# Patient Record
Sex: Female | Born: 1935
Health system: Southern US, Community
[De-identification: ages and names within clinical notes are randomized; demographics above are authoritative.]

## PROBLEM LIST (undated history)

## (undated) DIAGNOSIS — E785 Hyperlipidemia, unspecified: Secondary | ICD-10-CM

## (undated) DIAGNOSIS — C801 Malignant (primary) neoplasm, unspecified: Secondary | ICD-10-CM

## (undated) DIAGNOSIS — E039 Hypothyroidism, unspecified: Secondary | ICD-10-CM

## (undated) DIAGNOSIS — I10 Essential (primary) hypertension: Secondary | ICD-10-CM

## (undated) DIAGNOSIS — C439 Malignant melanoma of skin, unspecified: Secondary | ICD-10-CM

## (undated) DIAGNOSIS — E079 Disorder of thyroid, unspecified: Secondary | ICD-10-CM

## (undated) DIAGNOSIS — R079 Chest pain, unspecified: Secondary | ICD-10-CM

## (undated) DIAGNOSIS — I639 Cerebral infarction, unspecified: Secondary | ICD-10-CM

## (undated) DIAGNOSIS — I509 Heart failure, unspecified: Secondary | ICD-10-CM

## (undated) DIAGNOSIS — I4891 Unspecified atrial fibrillation: Secondary | ICD-10-CM

## (undated) DIAGNOSIS — I499 Cardiac arrhythmia, unspecified: Secondary | ICD-10-CM

## (undated) DIAGNOSIS — R001 Bradycardia, unspecified: Secondary | ICD-10-CM

## (undated) DIAGNOSIS — E559 Vitamin D deficiency, unspecified: Secondary | ICD-10-CM

## (undated) DIAGNOSIS — R4701 Aphasia: Secondary | ICD-10-CM

## (undated) DIAGNOSIS — R197 Diarrhea, unspecified: Secondary | ICD-10-CM

## (undated) DIAGNOSIS — M069 Rheumatoid arthritis, unspecified: Secondary | ICD-10-CM

## (undated) HISTORY — PX: BREAST BIOPSY: SHX20

## (undated) HISTORY — PX: MELANOMA EXCISION: SHX5266

## (undated) HISTORY — PX: REPLACEMENT TOTAL HIP W/  RESURFACING IMPLANTS: SUR1222

## (undated) HISTORY — DX: Hypothyroidism, unspecified: E03.9

## (undated) HISTORY — DX: Essential (primary) hypertension: I10

## (undated) HISTORY — PX: TOTAL HIP ARTHROPLASTY: SHX124

## (undated) HISTORY — DX: Hyperlipidemia, unspecified: E78.5

## (undated) HISTORY — DX: Aphasia: R47.01

## (undated) HISTORY — DX: Disorder of thyroid, unspecified: E07.9

## (undated) HISTORY — DX: Rheumatoid arthritis, unspecified: M06.9

## (undated) HISTORY — DX: Cerebral infarction, unspecified: I63.9

## (undated) HISTORY — DX: Chest pain, unspecified: R07.9

## (undated) HISTORY — DX: Unspecified atrial fibrillation: I48.91

## (undated) HISTORY — DX: Malignant melanoma of skin, unspecified: C43.9

## (undated) HISTORY — DX: Bradycardia, unspecified: R00.1

## (undated) HISTORY — PX: HIP SURGERY: SHX245

## (undated) HISTORY — DX: Cardiac arrhythmia, unspecified: I49.9

## (undated) HISTORY — DX: Diarrhea, unspecified: R19.7

## (undated) HISTORY — PX: OTHER SURGICAL HISTORY: SHX169

## (undated) HISTORY — DX: Vitamin D deficiency, unspecified: E55.9

## (undated) HISTORY — DX: Heart failure, unspecified: I50.9

---

## 1998-08-05 ENCOUNTER — Inpatient Hospital Stay
Admission: EM | Admit: 1998-08-05 | Disposition: A | Discharge status: Home / Self Care | Payer: Self-pay | Source: Ambulatory Visit

## 2008-12-07 ENCOUNTER — Ambulatory Visit: Payer: Self-pay

## 2009-12-18 ENCOUNTER — Emergency Department: Payer: Self-pay | Admitting: Emergency Medicine

## 2009-12-21 ENCOUNTER — Ambulatory Visit: Payer: Self-pay | Admitting: Orthopedic Surgery

## 2009-12-22 ENCOUNTER — Ambulatory Visit: Payer: Self-pay | Admitting: Orthopedic Surgery

## 2010-11-15 ENCOUNTER — Ambulatory Visit: Payer: Self-pay | Admitting: Family Medicine

## 2011-03-19 HISTORY — PX: OTHER SURGICAL HISTORY: SHX169

## 2011-03-20 DIAGNOSIS — H4010X0 Unspecified open-angle glaucoma, stage unspecified: Secondary | ICD-10-CM | POA: Diagnosis not present

## 2011-04-03 DIAGNOSIS — H4010X0 Unspecified open-angle glaucoma, stage unspecified: Secondary | ICD-10-CM | POA: Diagnosis not present

## 2011-04-09 DIAGNOSIS — E039 Hypothyroidism, unspecified: Secondary | ICD-10-CM | POA: Diagnosis not present

## 2011-04-09 DIAGNOSIS — L259 Unspecified contact dermatitis, unspecified cause: Secondary | ICD-10-CM | POA: Diagnosis not present

## 2011-04-17 DIAGNOSIS — H4010X0 Unspecified open-angle glaucoma, stage unspecified: Secondary | ICD-10-CM | POA: Diagnosis not present

## 2011-07-25 DIAGNOSIS — H4010X0 Unspecified open-angle glaucoma, stage unspecified: Secondary | ICD-10-CM | POA: Diagnosis not present

## 2011-08-16 DIAGNOSIS — L0390 Cellulitis, unspecified: Secondary | ICD-10-CM | POA: Diagnosis not present

## 2011-09-16 DIAGNOSIS — M064 Inflammatory polyarthropathy: Secondary | ICD-10-CM | POA: Diagnosis not present

## 2011-09-16 DIAGNOSIS — M069 Rheumatoid arthritis, unspecified: Secondary | ICD-10-CM | POA: Diagnosis not present

## 2011-09-23 DIAGNOSIS — M76899 Other specified enthesopathies of unspecified lower limb, excluding foot: Secondary | ICD-10-CM | POA: Diagnosis not present

## 2011-09-23 DIAGNOSIS — M069 Rheumatoid arthritis, unspecified: Secondary | ICD-10-CM | POA: Diagnosis not present

## 2011-12-02 ENCOUNTER — Ambulatory Visit: Payer: Self-pay | Admitting: Family Medicine

## 2011-12-02 DIAGNOSIS — Z1231 Encounter for screening mammogram for malignant neoplasm of breast: Secondary | ICD-10-CM | POA: Diagnosis not present

## 2011-12-26 DIAGNOSIS — R030 Elevated blood-pressure reading, without diagnosis of hypertension: Secondary | ICD-10-CM | POA: Diagnosis not present

## 2011-12-26 DIAGNOSIS — Z23 Encounter for immunization: Secondary | ICD-10-CM | POA: Diagnosis not present

## 2011-12-26 DIAGNOSIS — D649 Anemia, unspecified: Secondary | ICD-10-CM | POA: Diagnosis not present

## 2011-12-26 DIAGNOSIS — E785 Hyperlipidemia, unspecified: Secondary | ICD-10-CM | POA: Diagnosis not present

## 2011-12-26 DIAGNOSIS — E039 Hypothyroidism, unspecified: Secondary | ICD-10-CM | POA: Diagnosis not present

## 2011-12-26 DIAGNOSIS — J3089 Other allergic rhinitis: Secondary | ICD-10-CM | POA: Diagnosis not present

## 2012-01-30 DIAGNOSIS — H4010X0 Unspecified open-angle glaucoma, stage unspecified: Secondary | ICD-10-CM | POA: Diagnosis not present

## 2012-02-26 DIAGNOSIS — H4010X0 Unspecified open-angle glaucoma, stage unspecified: Secondary | ICD-10-CM | POA: Diagnosis not present

## 2012-03-03 DIAGNOSIS — H4010X0 Unspecified open-angle glaucoma, stage unspecified: Secondary | ICD-10-CM | POA: Diagnosis not present

## 2012-03-05 DIAGNOSIS — E039 Hypothyroidism, unspecified: Secondary | ICD-10-CM | POA: Diagnosis not present

## 2012-03-23 DIAGNOSIS — M76899 Other specified enthesopathies of unspecified lower limb, excluding foot: Secondary | ICD-10-CM | POA: Diagnosis not present

## 2012-03-24 DIAGNOSIS — H4010X0 Unspecified open-angle glaucoma, stage unspecified: Secondary | ICD-10-CM | POA: Diagnosis not present

## 2012-03-25 DIAGNOSIS — M65839 Other synovitis and tenosynovitis, unspecified forearm: Secondary | ICD-10-CM | POA: Diagnosis not present

## 2012-03-25 DIAGNOSIS — M064 Inflammatory polyarthropathy: Secondary | ICD-10-CM | POA: Diagnosis not present

## 2012-05-07 DIAGNOSIS — I43 Cardiomyopathy in diseases classified elsewhere: Secondary | ICD-10-CM | POA: Diagnosis not present

## 2012-05-07 DIAGNOSIS — E039 Hypothyroidism, unspecified: Secondary | ICD-10-CM | POA: Diagnosis not present

## 2012-06-16 DIAGNOSIS — H4010X0 Unspecified open-angle glaucoma, stage unspecified: Secondary | ICD-10-CM | POA: Diagnosis not present

## 2012-06-23 DIAGNOSIS — R030 Elevated blood-pressure reading, without diagnosis of hypertension: Secondary | ICD-10-CM | POA: Diagnosis not present

## 2012-06-23 DIAGNOSIS — I43 Cardiomyopathy in diseases classified elsewhere: Secondary | ICD-10-CM | POA: Diagnosis not present

## 2012-06-23 DIAGNOSIS — E785 Hyperlipidemia, unspecified: Secondary | ICD-10-CM | POA: Diagnosis not present

## 2012-06-23 DIAGNOSIS — E039 Hypothyroidism, unspecified: Secondary | ICD-10-CM | POA: Diagnosis not present

## 2012-06-23 DIAGNOSIS — R5381 Other malaise: Secondary | ICD-10-CM | POA: Diagnosis not present

## 2012-10-07 DIAGNOSIS — M064 Inflammatory polyarthropathy: Secondary | ICD-10-CM | POA: Diagnosis not present

## 2012-10-13 DIAGNOSIS — M159 Polyosteoarthritis, unspecified: Secondary | ICD-10-CM | POA: Diagnosis not present

## 2012-10-13 DIAGNOSIS — M064 Inflammatory polyarthropathy: Secondary | ICD-10-CM | POA: Diagnosis not present

## 2012-10-14 DIAGNOSIS — H4010X0 Unspecified open-angle glaucoma, stage unspecified: Secondary | ICD-10-CM | POA: Diagnosis not present

## 2012-11-06 DIAGNOSIS — E039 Hypothyroidism, unspecified: Secondary | ICD-10-CM | POA: Diagnosis not present

## 2012-11-17 DIAGNOSIS — H4010X0 Unspecified open-angle glaucoma, stage unspecified: Secondary | ICD-10-CM | POA: Diagnosis not present

## 2012-12-02 ENCOUNTER — Ambulatory Visit: Payer: Self-pay | Admitting: Family Medicine

## 2012-12-02 DIAGNOSIS — Z1231 Encounter for screening mammogram for malignant neoplasm of breast: Secondary | ICD-10-CM | POA: Diagnosis not present

## 2012-12-17 DIAGNOSIS — Z85820 Personal history of malignant melanoma of skin: Secondary | ICD-10-CM | POA: Diagnosis not present

## 2012-12-17 DIAGNOSIS — Z1283 Encounter for screening for malignant neoplasm of skin: Secondary | ICD-10-CM | POA: Diagnosis not present

## 2012-12-17 DIAGNOSIS — L570 Actinic keratosis: Secondary | ICD-10-CM | POA: Diagnosis not present

## 2012-12-17 DIAGNOSIS — L723 Sebaceous cyst: Secondary | ICD-10-CM | POA: Diagnosis not present

## 2012-12-31 DIAGNOSIS — E039 Hypothyroidism, unspecified: Secondary | ICD-10-CM | POA: Diagnosis not present

## 2012-12-31 DIAGNOSIS — Z23 Encounter for immunization: Secondary | ICD-10-CM | POA: Diagnosis not present

## 2013-02-10 ENCOUNTER — Ambulatory Visit: Payer: Self-pay | Admitting: Family Medicine

## 2013-02-10 DIAGNOSIS — J984 Other disorders of lung: Secondary | ICD-10-CM | POA: Diagnosis not present

## 2013-02-10 DIAGNOSIS — Z8701 Personal history of pneumonia (recurrent): Secondary | ICD-10-CM | POA: Diagnosis not present

## 2013-02-10 DIAGNOSIS — E039 Hypothyroidism, unspecified: Secondary | ICD-10-CM | POA: Diagnosis not present

## 2013-02-10 DIAGNOSIS — E785 Hyperlipidemia, unspecified: Secondary | ICD-10-CM | POA: Diagnosis not present

## 2013-02-10 DIAGNOSIS — R030 Elevated blood-pressure reading, without diagnosis of hypertension: Secondary | ICD-10-CM | POA: Diagnosis not present

## 2013-02-10 DIAGNOSIS — D649 Anemia, unspecified: Secondary | ICD-10-CM | POA: Diagnosis not present

## 2013-02-10 DIAGNOSIS — I43 Cardiomyopathy in diseases classified elsewhere: Secondary | ICD-10-CM | POA: Diagnosis not present

## 2013-03-19 DIAGNOSIS — J329 Chronic sinusitis, unspecified: Secondary | ICD-10-CM | POA: Diagnosis not present

## 2013-04-07 DIAGNOSIS — M064 Inflammatory polyarthropathy: Secondary | ICD-10-CM | POA: Diagnosis not present

## 2013-04-22 DIAGNOSIS — M159 Polyosteoarthritis, unspecified: Secondary | ICD-10-CM | POA: Diagnosis not present

## 2013-04-22 DIAGNOSIS — M064 Inflammatory polyarthropathy: Secondary | ICD-10-CM | POA: Diagnosis not present

## 2013-04-27 DIAGNOSIS — J329 Chronic sinusitis, unspecified: Secondary | ICD-10-CM | POA: Diagnosis not present

## 2013-08-19 DIAGNOSIS — E785 Hyperlipidemia, unspecified: Secondary | ICD-10-CM | POA: Diagnosis not present

## 2013-08-19 DIAGNOSIS — I43 Cardiomyopathy in diseases classified elsewhere: Secondary | ICD-10-CM | POA: Diagnosis not present

## 2013-08-19 DIAGNOSIS — E039 Hypothyroidism, unspecified: Secondary | ICD-10-CM | POA: Diagnosis not present

## 2013-08-19 DIAGNOSIS — R030 Elevated blood-pressure reading, without diagnosis of hypertension: Secondary | ICD-10-CM | POA: Diagnosis not present

## 2013-09-24 DIAGNOSIS — H4010X0 Unspecified open-angle glaucoma, stage unspecified: Secondary | ICD-10-CM | POA: Diagnosis not present

## 2013-10-11 DIAGNOSIS — H4010X0 Unspecified open-angle glaucoma, stage unspecified: Secondary | ICD-10-CM | POA: Diagnosis not present

## 2013-10-14 DIAGNOSIS — M064 Inflammatory polyarthropathy: Secondary | ICD-10-CM | POA: Diagnosis not present

## 2013-10-21 DIAGNOSIS — M069 Rheumatoid arthritis, unspecified: Secondary | ICD-10-CM | POA: Diagnosis not present

## 2013-10-21 DIAGNOSIS — M064 Inflammatory polyarthropathy: Secondary | ICD-10-CM | POA: Diagnosis not present

## 2013-10-21 DIAGNOSIS — M1990 Unspecified osteoarthritis, unspecified site: Secondary | ICD-10-CM | POA: Diagnosis not present

## 2013-11-18 DIAGNOSIS — M064 Inflammatory polyarthropathy: Secondary | ICD-10-CM | POA: Diagnosis not present

## 2013-11-18 DIAGNOSIS — M1990 Unspecified osteoarthritis, unspecified site: Secondary | ICD-10-CM | POA: Diagnosis not present

## 2013-11-24 DIAGNOSIS — M064 Inflammatory polyarthropathy: Secondary | ICD-10-CM | POA: Diagnosis not present

## 2013-11-25 DIAGNOSIS — H4010X0 Unspecified open-angle glaucoma, stage unspecified: Secondary | ICD-10-CM | POA: Diagnosis not present

## 2013-12-07 DIAGNOSIS — H4010X0 Unspecified open-angle glaucoma, stage unspecified: Secondary | ICD-10-CM | POA: Diagnosis not present

## 2013-12-23 DIAGNOSIS — S80819A Abrasion, unspecified lower leg, initial encounter: Secondary | ICD-10-CM | POA: Diagnosis not present

## 2013-12-23 DIAGNOSIS — L03119 Cellulitis of unspecified part of limb: Secondary | ICD-10-CM | POA: Diagnosis not present

## 2014-01-07 DIAGNOSIS — H4010X2 Unspecified open-angle glaucoma, moderate stage: Secondary | ICD-10-CM | POA: Diagnosis not present

## 2014-01-12 ENCOUNTER — Ambulatory Visit: Payer: Self-pay | Admitting: Family Medicine

## 2014-01-12 DIAGNOSIS — Z1231 Encounter for screening mammogram for malignant neoplasm of breast: Secondary | ICD-10-CM | POA: Diagnosis not present

## 2014-01-17 DIAGNOSIS — M1990 Unspecified osteoarthritis, unspecified site: Secondary | ICD-10-CM | POA: Diagnosis not present

## 2014-01-24 DIAGNOSIS — M79642 Pain in left hand: Secondary | ICD-10-CM | POA: Diagnosis not present

## 2014-01-24 DIAGNOSIS — Z79899 Other long term (current) drug therapy: Secondary | ICD-10-CM | POA: Diagnosis not present

## 2014-01-24 DIAGNOSIS — M0589 Other rheumatoid arthritis with rheumatoid factor of multiple sites: Secondary | ICD-10-CM | POA: Diagnosis not present

## 2014-02-03 DIAGNOSIS — L814 Other melanin hyperpigmentation: Secondary | ICD-10-CM | POA: Diagnosis not present

## 2014-02-03 DIAGNOSIS — D485 Neoplasm of uncertain behavior of skin: Secondary | ICD-10-CM | POA: Diagnosis not present

## 2014-02-03 DIAGNOSIS — L923 Foreign body granuloma of the skin and subcutaneous tissue: Secondary | ICD-10-CM | POA: Diagnosis not present

## 2014-02-03 DIAGNOSIS — Z189 Retained foreign body fragments, unspecified material: Secondary | ICD-10-CM | POA: Diagnosis not present

## 2014-02-03 DIAGNOSIS — Z1283 Encounter for screening for malignant neoplasm of skin: Secondary | ICD-10-CM | POA: Diagnosis not present

## 2014-02-03 DIAGNOSIS — Z85820 Personal history of malignant melanoma of skin: Secondary | ICD-10-CM | POA: Diagnosis not present

## 2014-02-03 DIAGNOSIS — Z08 Encounter for follow-up examination after completed treatment for malignant neoplasm: Secondary | ICD-10-CM | POA: Diagnosis not present

## 2014-02-03 DIAGNOSIS — S90465A Insect bite (nonvenomous), left lesser toe(s), initial encounter: Secondary | ICD-10-CM | POA: Diagnosis not present

## 2014-02-03 DIAGNOSIS — L570 Actinic keratosis: Secondary | ICD-10-CM | POA: Diagnosis not present

## 2014-02-15 DIAGNOSIS — H4010X2 Unspecified open-angle glaucoma, moderate stage: Secondary | ICD-10-CM | POA: Diagnosis not present

## 2014-04-26 DIAGNOSIS — M0579 Rheumatoid arthritis with rheumatoid factor of multiple sites without organ or systems involvement: Secondary | ICD-10-CM | POA: Diagnosis not present

## 2014-04-26 DIAGNOSIS — Z79899 Other long term (current) drug therapy: Secondary | ICD-10-CM | POA: Diagnosis not present

## 2014-05-10 DIAGNOSIS — D649 Anemia, unspecified: Secondary | ICD-10-CM | POA: Diagnosis not present

## 2014-05-10 DIAGNOSIS — I32 Pericarditis in diseases classified elsewhere: Secondary | ICD-10-CM | POA: Diagnosis not present

## 2014-05-10 DIAGNOSIS — E785 Hyperlipidemia, unspecified: Secondary | ICD-10-CM | POA: Diagnosis not present

## 2014-05-10 DIAGNOSIS — I10 Essential (primary) hypertension: Secondary | ICD-10-CM | POA: Diagnosis not present

## 2014-05-10 DIAGNOSIS — I43 Cardiomyopathy in diseases classified elsewhere: Secondary | ICD-10-CM | POA: Diagnosis not present

## 2014-05-10 DIAGNOSIS — E039 Hypothyroidism, unspecified: Secondary | ICD-10-CM | POA: Diagnosis not present

## 2014-05-10 DIAGNOSIS — J3081 Allergic rhinitis due to animal (cat) (dog) hair and dander: Secondary | ICD-10-CM | POA: Diagnosis not present

## 2014-05-20 DIAGNOSIS — H4011X2 Primary open-angle glaucoma, moderate stage: Secondary | ICD-10-CM | POA: Diagnosis not present

## 2014-07-19 DIAGNOSIS — Z79899 Other long term (current) drug therapy: Secondary | ICD-10-CM | POA: Diagnosis not present

## 2014-07-19 DIAGNOSIS — M0579 Rheumatoid arthritis with rheumatoid factor of multiple sites without organ or systems involvement: Secondary | ICD-10-CM | POA: Diagnosis not present

## 2014-07-26 DIAGNOSIS — Z79899 Other long term (current) drug therapy: Secondary | ICD-10-CM | POA: Diagnosis not present

## 2014-07-26 DIAGNOSIS — M0579 Rheumatoid arthritis with rheumatoid factor of multiple sites without organ or systems involvement: Secondary | ICD-10-CM | POA: Diagnosis not present

## 2014-07-26 DIAGNOSIS — M150 Primary generalized (osteo)arthritis: Secondary | ICD-10-CM | POA: Diagnosis not present

## 2014-08-22 ENCOUNTER — Encounter: Payer: Self-pay | Admitting: Family Medicine

## 2014-08-22 ENCOUNTER — Other Ambulatory Visit: Payer: Self-pay

## 2014-08-22 ENCOUNTER — Ambulatory Visit (INDEPENDENT_AMBULATORY_CARE_PROVIDER_SITE_OTHER): Payer: Medicare Other | Admitting: Family Medicine

## 2014-08-22 VITALS — BP 120/60 | HR 60 | Ht 63.0 in | Wt 109.0 lb

## 2014-08-22 DIAGNOSIS — I32 Pericarditis in diseases classified elsewhere: Secondary | ICD-10-CM

## 2014-08-22 DIAGNOSIS — W57XXXA Bitten or stung by nonvenomous insect and other nonvenomous arthropods, initial encounter: Secondary | ICD-10-CM

## 2014-08-22 DIAGNOSIS — E785 Hyperlipidemia, unspecified: Secondary | ICD-10-CM | POA: Insufficient documentation

## 2014-08-22 DIAGNOSIS — E039 Hypothyroidism, unspecified: Secondary | ICD-10-CM | POA: Insufficient documentation

## 2014-08-22 DIAGNOSIS — M19049 Primary osteoarthritis, unspecified hand: Secondary | ICD-10-CM | POA: Insufficient documentation

## 2014-08-22 DIAGNOSIS — I519 Heart disease, unspecified: Secondary | ICD-10-CM

## 2014-08-22 DIAGNOSIS — S80861A Insect bite (nonvenomous), right lower leg, initial encounter: Secondary | ICD-10-CM | POA: Diagnosis not present

## 2014-08-22 DIAGNOSIS — I10 Essential (primary) hypertension: Secondary | ICD-10-CM | POA: Insufficient documentation

## 2014-08-22 DIAGNOSIS — B029 Zoster without complications: Secondary | ICD-10-CM | POA: Insufficient documentation

## 2014-08-22 DIAGNOSIS — J3081 Allergic rhinitis due to animal (cat) (dog) hair and dander: Secondary | ICD-10-CM | POA: Insufficient documentation

## 2014-08-22 DIAGNOSIS — D649 Anemia, unspecified: Secondary | ICD-10-CM | POA: Insufficient documentation

## 2014-08-22 MED ORDER — DOXYCYCLINE HYCLATE 100 MG PO TABS: 100.0000 mg | ORAL_TABLET | Freq: Two times a day (BID) | ORAL | Status: DC

## 2014-08-22 MED ORDER — MUPIROCIN 2 % EX OINT: 1.0000 "application " | TOPICAL_OINTMENT | Freq: Two times a day (BID) | CUTANEOUS | Status: DC

## 2014-08-22 NOTE — Progress Notes (Signed)
Name: Andrea Kemp   MRN: [id number]    DOB: 12-21-1929   Date:08/22/2014       Progress Note  Subjective  Chief Complaint  Chief Complaint  Patient presents with  . Rash    tick bite on back of L) leg- a blister came up between toes    Rash This is a new problem. The current episode started in the past 7 days. The problem has been gradually improving since onset. The affected locations include the left lower leg. The rash is characterized by bruising, itchiness, redness and blistering (left foot blister/ thursday). She was exposed to an insect bite/sting. Associated symptoms include a fever. Pertinent negatives include no anorexia, congestion, cough, shortness of breath or sore throat. Past treatments include topical steroids, antihistamine and analgesics (tylenol PM). The treatment provided mild relief.    No problem-specific assessment & plan notes found for this encounter.   Past Medical History  Diagnosis Date  . Hypothyroidism   . Hyperlipidemia   . Hypertension     Past Surgical History  Procedure Laterality Date  . Melanoma excision    . Replacement total hip w/  resurfacing implants Left     History reviewed. No pertinent family history.  History   Social History  . Marital Status: Married    Spouse Name: N/A  . Number of Children: N/A  . Years of Education: N/A   Occupational History  . Not on file.   Social History Main Topics  . Smoking status: Never Smoker   . Smokeless tobacco: Not on file  . Alcohol Use: 0.0 oz/week    0 Standard drinks or equivalent per week  . Drug Use: No  . Sexual Activity: No   Other Topics Concern  . Not on file   Social History Narrative  . No narrative on file    Allergies  Allergen Reactions  . Ace Inhibitors      Review of Systems  Constitutional: Positive for fever. Negative for chills and malaise/fatigue.  HENT: Negative for congestion and sore throat.   Eyes: Negative for blurred vision.   Respiratory: Negative for cough, shortness of breath and wheezing.   Cardiovascular: Negative for chest pain and palpitations.  Gastrointestinal: Negative for anorexia.  Skin: Positive for itching and rash.  Neurological: Negative for headaches.  Endo/Heme/Allergies: Bruises/bleeds easily.     Objective  Filed Vitals:   08/22/14 1025  BP: 120/60  Pulse: 60  Height: '5\' 3"'$  (1.6 m)  Weight: 109 lb (49.442 kg)    Physical Exam  Constitutional: She is well-developed, well-nourished, and in no distress.  HENT:  Head: Normocephalic.  Right Ear: External ear normal.  Left Ear: External ear normal.  Eyes: Conjunctivae and EOM are normal. Pupils are equal, round, and reactive to light.  Neck: Normal range of motion. Neck supple. No thyromegaly present.  Cardiovascular: Normal rate, regular rhythm, normal heart sounds and intact distal pulses.   No murmur heard. Pulmonary/Chest: Effort normal and breath sounds normal. No respiratory distress. She has no wheezes. She has no rales.  Abdominal: Soft. Bowel sounds are normal. She exhibits no mass. There is no tenderness. There is no guarding.  Lymphadenopathy:    She has no cervical adenopathy.  Neurological: She is alert.  Skin: Skin is warm. Rash noted. There is erythema.  Deer tick removed right foot  Psychiatric: Mood and affect normal.  Nursing note and vitals reviewed.     No results found for this or any previous  visit (from the past 2160 hour(s)).   Assessment & Plan  Problem List Items Addressed This Visit    None        Dr. Otilio Miu [facility]  08/22/2014

## 2014-10-19 DIAGNOSIS — M0579 Rheumatoid arthritis with rheumatoid factor of multiple sites without organ or systems involvement: Secondary | ICD-10-CM | POA: Diagnosis not present

## 2014-10-19 DIAGNOSIS — Z79899 Other long term (current) drug therapy: Secondary | ICD-10-CM | POA: Diagnosis not present

## 2014-10-26 DIAGNOSIS — M150 Primary generalized (osteo)arthritis: Secondary | ICD-10-CM | POA: Diagnosis not present

## 2014-10-26 DIAGNOSIS — M0579 Rheumatoid arthritis with rheumatoid factor of multiple sites without organ or systems involvement: Secondary | ICD-10-CM | POA: Diagnosis not present

## 2014-11-08 DIAGNOSIS — H4011X2 Primary open-angle glaucoma, moderate stage: Secondary | ICD-10-CM | POA: Diagnosis not present

## 2014-12-19 ENCOUNTER — Encounter: Payer: Self-pay | Admitting: Family Medicine

## 2014-12-19 ENCOUNTER — Ambulatory Visit (INDEPENDENT_AMBULATORY_CARE_PROVIDER_SITE_OTHER): Payer: Medicare Other | Admitting: Family Medicine

## 2014-12-19 VITALS — BP 110/70 | HR 60 | Ht 63.0 in | Wt 108.0 lb

## 2014-12-19 DIAGNOSIS — J0100 Acute maxillary sinusitis, unspecified: Secondary | ICD-10-CM

## 2014-12-19 MED ORDER — GUAIFENESIN-CODEINE 100-10 MG/5ML PO SOLN: 5.0000 mL | Freq: Three times a day (TID) | ORAL | Status: DC | PRN

## 2014-12-19 MED ORDER — AMOXICILLIN 500 MG PO CAPS: 500.0000 mg | ORAL_CAPSULE | Freq: Three times a day (TID) | ORAL | Status: DC

## 2014-12-19 NOTE — Progress Notes (Signed)
Name: Andrea Kemp   MRN: [id number]    DOB: 03/05/1930   Date:12/19/2014       Progress Note  Subjective  Chief Complaint  Chief Complaint  Patient presents with  . Sinusitis    yellow production    Sinusitis This is a new problem. The current episode started in the past 7 days. The problem has been gradually worsening since onset. There has been no fever. Associated symptoms include chills, congestion, coughing, ear pain, headaches, shortness of breath, sinus pressure, sneezing and a sore throat. Pertinent negatives include no diaphoresis, neck pain or swollen glands. Past treatments include acetaminophen (mucinex). The treatment provided mild relief.    No problem-specific assessment & plan notes found for this encounter.   Past Medical History  Diagnosis Date  . Hypothyroidism   . Hyperlipidemia   . Hypertension     Past Surgical History  Procedure Laterality Date  . Melanoma excision    . Replacement total hip w/  resurfacing implants Left     History reviewed. No pertinent family history.  Social History   Social History  . Marital Status: Married    Spouse Name: N/A  . Number of Children: N/A  . Years of Education: N/A   Occupational History  . Not on file.   Social History Main Topics  . Smoking status: Never Smoker   . Smokeless tobacco: Not on file  . Alcohol Use: 0.0 oz/week    0 Standard drinks or equivalent per week  . Drug Use: No  . Sexual Activity: No   Other Topics Concern  . Not on file   Social History Narrative    Allergies  Allergen Reactions  . Ace Inhibitors      Review of Systems  Constitutional: Positive for chills. Negative for fever, weight loss, malaise/fatigue and diaphoresis.  HENT: Positive for congestion, ear pain, sinus pressure, sneezing and sore throat. Negative for ear discharge.   Eyes: Negative for blurred vision.  Respiratory: Positive for cough and shortness of breath. Negative for sputum production  and wheezing.   Cardiovascular: Negative for chest pain, palpitations and leg swelling.  Gastrointestinal: Negative for heartburn, nausea, abdominal pain, diarrhea, constipation, blood in stool and melena.  Genitourinary: Negative for dysuria, urgency, frequency and hematuria.  Musculoskeletal: Negative for myalgias, back pain, joint pain and neck pain.  Skin: Negative for rash.  Neurological: Positive for headaches. Negative for dizziness, tingling, sensory change and focal weakness.  Endo/Heme/Allergies: Negative for environmental allergies and polydipsia. Does not bruise/bleed easily.  Psychiatric/Behavioral: Negative for depression and suicidal ideas. The patient is not nervous/anxious and does not have insomnia.      Objective  Filed Vitals:   12/19/14 1336  BP: 110/70  Pulse: 60  Height: '5\' 3"'$  (1.6 m)  Weight: 108 lb (48.988 kg)    Physical Exam  Constitutional: She is well-developed, well-nourished, and in no distress. No distress.  HENT:  Head: Normocephalic and atraumatic.  Right Ear: External ear normal.  Left Ear: External ear normal.  Nose: Nose normal.  Mouth/Throat: Oropharynx is clear and moist.  Eyes: Conjunctivae and EOM are normal. Pupils are equal, round, and reactive to light. Right eye exhibits no discharge. Left eye exhibits no discharge.  Neck: Normal range of motion. Neck supple. No JVD present. No thyromegaly present.  Cardiovascular: Normal rate, regular rhythm, normal heart sounds and intact distal pulses.  Exam reveals no gallop and no friction rub.   No murmur heard. Pulmonary/Chest: Effort normal and breath  sounds normal.  Abdominal: Soft. Bowel sounds are normal. She exhibits no mass. There is no tenderness. There is no guarding.  Musculoskeletal: Normal range of motion. She exhibits no edema.  Lymphadenopathy:    She has no cervical adenopathy.  Neurological: She is alert. She has normal reflexes.  Skin: Skin is warm and dry. She is not  diaphoretic.  Psychiatric: Mood and affect normal.      Assessment & Plan  Problem List Items Addressed This Visit    None    Visit Diagnoses    Acute maxillary sinusitis, recurrence not specified    -  Primary    Relevant Medications    amoxicillin (AMOXIL) 500 MG capsule    guaiFENesin-codeine 100-10 MG/5ML syrup         Dr. Deanna Jones [facility]  12/19/2014

## 2014-12-30 ENCOUNTER — Other Ambulatory Visit: Payer: Self-pay | Admitting: Family Medicine

## 2015-01-10 ENCOUNTER — Other Ambulatory Visit: Payer: Self-pay

## 2015-01-10 DIAGNOSIS — Z1231 Encounter for screening mammogram for malignant neoplasm of breast: Secondary | ICD-10-CM

## 2015-01-13 ENCOUNTER — Other Ambulatory Visit: Payer: Self-pay | Admitting: Family Medicine

## 2015-01-19 ENCOUNTER — Ambulatory Visit
Admission: RE | Admit: 2015-01-19 | Discharge: 2015-01-19 | Disposition: A | Discharge status: Home / Self Care | Payer: Medicare Other | Source: Ambulatory Visit | Attending: Family Medicine | Admitting: Family Medicine

## 2015-01-19 DIAGNOSIS — Z1231 Encounter for screening mammogram for malignant neoplasm of breast: Secondary | ICD-10-CM | POA: Diagnosis not present

## 2015-01-19 HISTORY — DX: Malignant (primary) neoplasm, unspecified: C80.1

## 2015-01-25 DIAGNOSIS — M0579 Rheumatoid arthritis with rheumatoid factor of multiple sites without organ or systems involvement: Secondary | ICD-10-CM | POA: Diagnosis not present

## 2015-04-10 ENCOUNTER — Other Ambulatory Visit: Payer: Self-pay

## 2015-04-25 ENCOUNTER — Ambulatory Visit (INDEPENDENT_AMBULATORY_CARE_PROVIDER_SITE_OTHER): Payer: Medicare Other | Admitting: Family Medicine

## 2015-04-25 ENCOUNTER — Encounter: Payer: Self-pay | Admitting: Family Medicine

## 2015-04-25 VITALS — BP 110/62 | HR 62 | Ht 63.0 in | Wt 108.0 lb

## 2015-04-25 DIAGNOSIS — J302 Other seasonal allergic rhinitis: Secondary | ICD-10-CM

## 2015-04-25 DIAGNOSIS — E039 Hypothyroidism, unspecified: Secondary | ICD-10-CM

## 2015-04-25 DIAGNOSIS — E785 Hyperlipidemia, unspecified: Secondary | ICD-10-CM | POA: Diagnosis not present

## 2015-04-25 DIAGNOSIS — I10 Essential (primary) hypertension: Secondary | ICD-10-CM | POA: Diagnosis not present

## 2015-04-25 MED ORDER — FLUTICASONE PROPIONATE 50 MCG/ACT NA SUSP: 2.0000 | NASAL | Status: DC | PRN

## 2015-04-25 MED ORDER — ATORVASTATIN CALCIUM 20 MG PO TABS
ORAL_TABLET | ORAL | Status: DC
Start: 2015-04-25 — End: 2016-05-02

## 2015-04-25 MED ORDER — LEVOTHYROXINE SODIUM 88 MCG PO TABS: 88.0000 ug | ORAL_TABLET | Freq: Every day | ORAL | Status: DC

## 2015-04-25 MED ORDER — LOSARTAN POTASSIUM 50 MG PO TABS: ORAL_TABLET | ORAL | Status: DC

## 2015-04-25 NOTE — Progress Notes (Signed)
Name: Andrea Kemp   MRN: [id number]    DOB: [birth year]   Date:04/25/2015       Progress Note  Subjective  Chief Complaint  Chief Complaint  Patient presents with  . Hypertension  . Hyperlipidemia  . Hypothyroidism    Hypertension This is a chronic problem. The current episode started more than 1 year ago. The problem has been gradually improving since onset. The problem is controlled. Pertinent negatives include no anxiety, blurred vision, chest pain, headaches, malaise/fatigue, neck pain, orthopnea, palpitations, peripheral edema, PND, shortness of breath or sweats. There are no associated agents to hypertension. There are no known risk factors for coronary artery disease. Past treatments include angiotensin blockers. The current treatment provides moderate improvement. There are no compliance problems.  There is no history of angina, kidney disease, CAD/MI, CVA, heart failure, left ventricular hypertrophy, PVD, renovascular disease or retinopathy. There is no history of chronic renal disease or a hypertension causing med.  Hyperlipidemia This is a chronic problem. The current episode started more than 1 year ago. The problem is controlled. Recent lipid tests were reviewed and are low. She has no history of chronic renal disease. Factors aggravating her hyperlipidemia include thiazides. Pertinent negatives include no chest pain, focal sensory loss, focal weakness, leg pain, myalgias or shortness of breath. Current antihyperlipidemic treatment includes statins. The current treatment provides mild improvement of lipids. There are no compliance problems.  There are no known risk factors for coronary artery disease.    No problem-specific assessment & plan notes found for this encounter.   Past Medical History  Diagnosis Date  . Hypothyroidism   . Hyperlipidemia   . Hypertension   . Cancer ([place])     skin ca    Past Surgical History  Procedure Laterality Date  . Melanoma  excision    . Replacement total hip w/  resurfacing implants Left   . Breast biopsy Left     neg    Family History  Problem Relation Age of Onset  . Breast cancer Sister 40    Social History   Social History  . Marital Status: Married    Spouse Name: N/A  . Number of Children: N/A  . Years of Education: N/A   Occupational History  . Not on file.   Social History Main Topics  . Smoking status: Never Smoker   . Smokeless tobacco: Not on file  . Alcohol Use: 0.0 oz/week    0 Standard drinks or equivalent per week  . Drug Use: No  . Sexual Activity: No   Other Topics Concern  . Not on file   Social History Narrative    Allergies  Allergen Reactions  . Ace Inhibitors      Review of Systems  Constitutional: Negative for fever, chills, weight loss and malaise/fatigue.  HENT: Negative for ear discharge, ear pain and sore throat.   Eyes: Negative for blurred vision.  Respiratory: Negative for cough, sputum production, shortness of breath and wheezing.   Cardiovascular: Negative for chest pain, palpitations, orthopnea, leg swelling and PND.  Gastrointestinal: Negative for heartburn, nausea, abdominal pain, diarrhea, constipation, blood in stool and melena.  Genitourinary: Negative for dysuria, urgency, frequency and hematuria.  Musculoskeletal: Negative for myalgias, back pain, joint pain and neck pain.  Skin: Negative for rash.  Neurological: Negative for dizziness, tingling, sensory change, focal weakness and headaches.  Endo/Heme/Allergies: Negative for environmental allergies and polydipsia. Does not bruise/bleed easily.  Psychiatric/Behavioral: Negative for depression and suicidal ideas. The  patient is not nervous/anxious and does not have insomnia.      Objective  Filed Vitals:   04/25/15 1336  BP: 110/62  Pulse: 62  Height: '5\' 3"'$  (1.6 m)  Weight: 108 lb (48.988 kg)    Physical Exam  Constitutional: She is well-developed, well-nourished, and in no  distress. No distress.  HENT:  Head: Normocephalic and atraumatic.  Right Ear: External ear normal.  Left Ear: External ear normal.  Nose: Nose normal.  Mouth/Throat: Oropharynx is clear and moist.  Eyes: Conjunctivae and EOM are normal. Pupils are equal, round, and reactive to light. Right eye exhibits no discharge. Left eye exhibits no discharge.  Neck: Normal range of motion. Neck supple. No JVD present. No thyromegaly present.  Cardiovascular: Normal rate, regular rhythm, normal heart sounds and intact distal pulses.  Exam reveals no gallop and no friction rub.   No murmur heard. Pulmonary/Chest: Effort normal and breath sounds normal.  Abdominal: Soft. Bowel sounds are normal. She exhibits no mass. There is no tenderness. There is no guarding.  Musculoskeletal: Normal range of motion. She exhibits no edema.  Lymphadenopathy:    She has no cervical adenopathy.  Neurological: She is alert.  Skin: Skin is warm and dry. She is not diaphoretic.  Psychiatric: Mood and affect normal.      Assessment & Plan  Problem List Items Addressed This Visit      Cardiovascular and Mediastinum   Benign essential HTN - Primary   Relevant Medications   atorvastatin (LIPITOR) 20 MG tablet   losartan (COZAAR) 50 MG tablet   Other Relevant Orders   Renal Function Panel     Other   HLD (hyperlipidemia)   Relevant Medications   atorvastatin (LIPITOR) 20 MG tablet   losartan (COZAAR) 50 MG tablet   Other Relevant Orders   Lipid Profile    Other Visit Diagnoses    Hypothyroidism, unspecified hypothyroidism type        Relevant Medications    levothyroxine (SYNTHROID, LEVOTHROID) 88 MCG tablet    Other Relevant Orders    TSH    Other seasonal allergic rhinitis        Relevant Medications    fluticasone (FLONASE) 50 MCG/ACT nasal spray         Dr. Deanna Jones [facility]  04/25/2015

## 2015-04-26 DIAGNOSIS — M0579 Rheumatoid arthritis with rheumatoid factor of multiple sites without organ or systems involvement: Secondary | ICD-10-CM | POA: Diagnosis not present

## 2015-04-26 LAB — LIPID PANEL
CHOLESTEROL TOTAL: 140 mg/dL (ref 100–199)
Chol/HDL Ratio: 2.5 ratio units (ref 0.0–4.4)
HDL: 57 mg/dL (ref >39)
LDL CALC: 56 mg/dL (ref 0–99)
TRIGLYCERIDES: 136 mg/dL (ref 0–149)
VLDL Cholesterol Cal: 27 mg/dL (ref 5–40)

## 2015-04-26 LAB — RENAL FUNCTION PANEL
Albumin: 4.8 g/dL — ABNORMAL HIGH (ref 3.5–4.7)
BUN/Creatinine Ratio: 25 (ref 11–26)
BUN: 18 mg/dL (ref 8–27)
CO2: 26 mmol/L (ref 18–29)
Calcium: 10.2 mg/dL (ref 8.7–10.3)
Chloride: 99 mmol/L (ref 96–106)
Creatinine, Ser: 0.71 mg/dL (ref 0.57–1.00)
GFR calc Af Amer: 91 mL/min/{1.73_m2} (ref >59)
GFR calc non Af Amer: 79 mL/min/{1.73_m2} (ref >59)
Glucose: 89 mg/dL (ref 65–99)
Phosphorus: 4.4 mg/dL (ref 2.5–4.5)
Potassium: 4.4 mmol/L (ref 3.5–5.2)
Sodium: 140 mmol/L (ref 134–144)

## 2015-04-26 LAB — TSH: TSH: 0.784 u[IU]/mL (ref 0.450–4.500)

## 2015-05-03 DIAGNOSIS — M0579 Rheumatoid arthritis with rheumatoid factor of multiple sites without organ or systems involvement: Secondary | ICD-10-CM | POA: Diagnosis not present

## 2015-05-03 DIAGNOSIS — I950 Idiopathic hypotension: Secondary | ICD-10-CM | POA: Diagnosis not present

## 2015-05-03 DIAGNOSIS — M150 Primary generalized (osteo)arthritis: Secondary | ICD-10-CM | POA: Diagnosis not present

## 2015-05-09 DIAGNOSIS — H401112 Primary open-angle glaucoma, right eye, moderate stage: Secondary | ICD-10-CM | POA: Diagnosis not present

## 2015-07-04 DIAGNOSIS — H401112 Primary open-angle glaucoma, right eye, moderate stage: Secondary | ICD-10-CM | POA: Diagnosis not present

## 2015-07-18 DIAGNOSIS — H401112 Primary open-angle glaucoma, right eye, moderate stage: Secondary | ICD-10-CM | POA: Diagnosis not present

## 2015-08-01 DIAGNOSIS — I950 Idiopathic hypotension: Secondary | ICD-10-CM | POA: Diagnosis not present

## 2015-08-01 DIAGNOSIS — M0579 Rheumatoid arthritis with rheumatoid factor of multiple sites without organ or systems involvement: Secondary | ICD-10-CM | POA: Diagnosis not present

## 2015-08-01 DIAGNOSIS — M150 Primary generalized (osteo)arthritis: Secondary | ICD-10-CM | POA: Diagnosis not present

## 2015-08-29 DIAGNOSIS — H401112 Primary open-angle glaucoma, right eye, moderate stage: Secondary | ICD-10-CM | POA: Diagnosis not present

## 2015-10-05 ENCOUNTER — Encounter: Payer: Self-pay | Admitting: Family Medicine

## 2015-10-05 ENCOUNTER — Ambulatory Visit (INDEPENDENT_AMBULATORY_CARE_PROVIDER_SITE_OTHER): Payer: Medicare Other | Admitting: Family Medicine

## 2015-10-05 VITALS — BP 138/78 | HR 60 | Temp 97.7°F | Ht 60.0 in | Wt 110.0 lb

## 2015-10-05 DIAGNOSIS — E039 Hypothyroidism, unspecified: Secondary | ICD-10-CM

## 2015-10-05 DIAGNOSIS — I10 Essential (primary) hypertension: Secondary | ICD-10-CM

## 2015-10-05 DIAGNOSIS — K900 Celiac disease: Secondary | ICD-10-CM | POA: Diagnosis not present

## 2015-10-05 NOTE — Progress Notes (Signed)
Name: Andrea Kemp   MRN: [id number]    DOB: 10/29/1929   Date:10/05/2015       Progress Note  Subjective  Chief Complaint  Chief Complaint  Patient presents with  . Hypertension    Hypertension This is a chronic problem. The current episode started more than 1 year ago. The problem has been waxing and waning since onset. The problem is controlled. Pertinent negatives include no anxiety, blurred vision, chest pain, headaches, malaise/fatigue, neck pain, orthopnea, palpitations, peripheral edema, PND, shortness of breath or sweats. There are no associated agents to hypertension. There are no known risk factors for coronary artery disease.    No problem-specific assessment & plan notes found for this encounter.   Past Medical History  Diagnosis Date  . Hypertension   . Hypothyroidism   . Hyperlipidemia     Past Surgical History  Procedure Laterality Date  . Total hip arthroplasty Left   . Arm surgery Right 2013    pin placed from fracture    History reviewed. No pertinent family history.  Social History   Social History  . Marital Status: Widowed    Spouse Name: N/A  . Number of Children: N/A  . Years of Education: N/A   Occupational History  . Not on file.   Social History Main Topics  . Smoking status: Never Smoker   . Smokeless tobacco: Never Used  . Alcohol Use: 4.2 oz/week    7 Glasses of wine per week  . Drug Use: No  . Sexual Activity: Not on file   Other Topics Concern  . Not on file   Social History Narrative  . No narrative on file    Allergies  Allergen Reactions  . Lisinopril Other (See Comments)    Mouth and tongue swelling     Review of Systems  Constitutional: Negative for fever, chills, weight loss and malaise/fatigue.  HENT: Negative for ear discharge, ear pain and sore throat.   Eyes: Negative for blurred vision.  Respiratory: Negative for cough, sputum production, shortness of breath and wheezing.   Cardiovascular: Negative  for chest pain, palpitations, orthopnea, leg swelling and PND.  Gastrointestinal: Negative for heartburn, nausea, abdominal pain, diarrhea, constipation, blood in stool and melena.  Genitourinary: Negative for dysuria, urgency, frequency and hematuria.  Musculoskeletal: Negative for myalgias, back pain, joint pain and neck pain.  Skin: Negative for rash.  Neurological: Negative for dizziness, tingling, sensory change, focal weakness and headaches.  Endo/Heme/Allergies: Negative for environmental allergies and polydipsia. Does not bruise/bleed easily.  Psychiatric/Behavioral: Negative for depression and suicidal ideas. The patient is not nervous/anxious and does not have insomnia.      Objective  Filed Vitals:   10/05/15 1034 10/05/15 1059  BP: 152/76 138/78  Pulse: 60   Temp: 97.7 F (36.5 C)   Weight: 110 lb (49.896 kg)   SpO2: 98%     Physical Exam  Constitutional: She is well-developed, well-nourished, and in no distress. No distress.  HENT:  Head: Normocephalic and atraumatic.  Right Ear: External ear normal.  Left Ear: External ear normal.  Nose: Nose normal.  Mouth/Throat: Oropharynx is clear and moist.  Eyes: Conjunctivae and EOM are normal. Pupils are equal, round, and reactive to light. Right eye exhibits no discharge. Left eye exhibits no discharge.  Neck: Normal range of motion. Neck supple. No JVD present. No thyromegaly present.  Cardiovascular: Normal rate, regular rhythm, normal heart sounds and intact distal pulses.  Exam reveals no gallop and no friction rub.  No murmur heard. Pulmonary/Chest: Effort normal and breath sounds normal. She has no wheezes. She has no rales.  Abdominal: Soft. Bowel sounds are normal. She exhibits no mass. There is no tenderness. There is no guarding.  Musculoskeletal: Normal range of motion. She exhibits no edema.  Lymphadenopathy:    She has no cervical adenopathy.  Neurological: She is alert. She has normal reflexes.  Skin:  Skin is warm and dry. She is not diaphoretic.  Psychiatric: Mood and affect normal.  Nursing note and vitals reviewed.     Assessment & Plan  Problem List Items Addressed This Visit    None    Visit Diagnoses    Essential hypertension    -  Primary    Relevant Medications    diltiazem (CARDIZEM CD) 240 MG 24 hr capsule    losartan (COZAAR) 50 MG tablet    atorvastatin (LIPITOR) 20 MG tablet    Other Relevant Orders    Renal Function Panel    Celiac disease        Relevant Orders    CBC    Hypothyroidism, unspecified hypothyroidism type        Relevant Medications    levothyroxine (SYNTHROID, LEVOTHROID) 88 MCG tablet    Other Relevant Orders    TSH         Dr. Deanna Jones [facility]  10/05/2015

## 2015-10-06 LAB — CBC
Hematocrit: 42.5 % (ref 34.0–46.6)
Hemoglobin: 13.8 g/dL (ref 11.1–15.9)
MCH: 33.9 pg — AB (ref 26.6–33.0)
MCHC: 32.5 g/dL (ref 31.5–35.7)
MCV: 104 fL — ABNORMAL HIGH (ref 79–97)
Platelets: 252 10*3/uL (ref 150–379)
RBC: 4.07 x10E6/uL (ref 3.77–5.28)
RDW: 13.9 % (ref 12.3–15.4)
WBC: 6 10*3/uL (ref 3.4–10.8)

## 2015-10-06 LAB — RENAL FUNCTION PANEL
Albumin: 4.5 g/dL (ref 3.5–4.7)
BUN / CREAT RATIO: 20 (ref 12–28)
BUN: 14 mg/dL (ref 8–27)
CALCIUM: 9.4 mg/dL (ref 8.7–10.3)
CHLORIDE: 98 mmol/L (ref 96–106)
CO2: 22 mmol/L (ref 18–29)
Creatinine, Ser: 0.71 mg/dL (ref 0.57–1.00)
GFR calc non Af Amer: 79 mL/min/{1.73_m2} (ref >59)
GFR, EST AFRICAN AMERICAN: 91 mL/min/{1.73_m2} (ref >59)
GLUCOSE: 100 mg/dL — AB (ref 65–99)
POTASSIUM: 4 mmol/L (ref 3.5–5.2)
Phosphorus: 3.8 mg/dL (ref 2.5–4.5)
Sodium: 140 mmol/L (ref 134–144)

## 2015-10-06 LAB — TSH: TSH: 0.682 u[IU]/mL (ref 0.450–4.500)

## 2015-10-17 ENCOUNTER — Encounter: Payer: Self-pay | Admitting: Family Medicine

## 2015-10-24 DIAGNOSIS — I950 Idiopathic hypotension: Secondary | ICD-10-CM | POA: Diagnosis not present

## 2015-10-24 DIAGNOSIS — M150 Primary generalized (osteo)arthritis: Secondary | ICD-10-CM | POA: Diagnosis not present

## 2015-10-24 DIAGNOSIS — M0579 Rheumatoid arthritis with rheumatoid factor of multiple sites without organ or systems involvement: Secondary | ICD-10-CM | POA: Diagnosis not present

## 2015-10-31 DIAGNOSIS — M79642 Pain in left hand: Secondary | ICD-10-CM | POA: Diagnosis not present

## 2015-10-31 DIAGNOSIS — M150 Primary generalized (osteo)arthritis: Secondary | ICD-10-CM | POA: Diagnosis not present

## 2015-10-31 DIAGNOSIS — Z79899 Other long term (current) drug therapy: Secondary | ICD-10-CM | POA: Diagnosis not present

## 2015-10-31 DIAGNOSIS — M0579 Rheumatoid arthritis with rheumatoid factor of multiple sites without organ or systems involvement: Secondary | ICD-10-CM | POA: Diagnosis not present

## 2015-11-16 ENCOUNTER — Encounter: Payer: Self-pay | Admitting: Family Medicine

## 2015-11-16 ENCOUNTER — Ambulatory Visit (INDEPENDENT_AMBULATORY_CARE_PROVIDER_SITE_OTHER): Payer: Medicare Other | Admitting: Family Medicine

## 2015-11-16 VITALS — BP 122/70 | HR 80 | Ht 60.0 in | Wt 112.0 lb

## 2015-11-16 DIAGNOSIS — I10 Essential (primary) hypertension: Secondary | ICD-10-CM | POA: Diagnosis not present

## 2015-11-16 DIAGNOSIS — R0602 Shortness of breath: Secondary | ICD-10-CM | POA: Diagnosis not present

## 2015-11-16 DIAGNOSIS — E0500 Thyrotoxicosis with diffuse goiter without thyrotoxic crisis or storm: Secondary | ICD-10-CM | POA: Diagnosis not present

## 2015-11-16 DIAGNOSIS — M069 Rheumatoid arthritis, unspecified: Secondary | ICD-10-CM | POA: Diagnosis not present

## 2015-11-16 DIAGNOSIS — R0989 Other specified symptoms and signs involving the circulatory and respiratory systems: Secondary | ICD-10-CM | POA: Diagnosis not present

## 2015-11-16 DIAGNOSIS — K219 Gastro-esophageal reflux disease without esophagitis: Secondary | ICD-10-CM | POA: Diagnosis not present

## 2015-11-16 DIAGNOSIS — I208 Other forms of angina pectoris: Secondary | ICD-10-CM | POA: Diagnosis not present

## 2015-11-16 DIAGNOSIS — R55 Syncope and collapse: Secondary | ICD-10-CM | POA: Diagnosis not present

## 2015-11-16 DIAGNOSIS — I4891 Unspecified atrial fibrillation: Secondary | ICD-10-CM | POA: Diagnosis not present

## 2015-11-16 DIAGNOSIS — R42 Dizziness and giddiness: Secondary | ICD-10-CM | POA: Diagnosis not present

## 2015-11-16 DIAGNOSIS — E039 Hypothyroidism, unspecified: Secondary | ICD-10-CM | POA: Diagnosis not present

## 2015-11-16 DIAGNOSIS — D649 Anemia, unspecified: Secondary | ICD-10-CM | POA: Diagnosis not present

## 2015-11-16 DIAGNOSIS — M1990 Unspecified osteoarthritis, unspecified site: Secondary | ICD-10-CM | POA: Diagnosis not present

## 2015-11-16 NOTE — Progress Notes (Signed)
Name: Andrea Kemp   MRN: [id number]    DOB: [birth year]   Date:11/16/2015       Progress Note  Subjective  Chief Complaint  Chief Complaint  Patient presents with  . Dizziness    "get off balance when turn head too quickly"  . Labs Only    wants thyroid rechecked    Dizziness  This is a recurrent problem. The current episode started in the past 7 days (turn head left/ felt funny/ near syncopy/ duration 4-5 minutes). The problem occurs intermittently. The problem has been waxing and waning. Pertinent negatives include no abdominal pain, anorexia, arthralgias, change in bowel habit, chest pain, chills, congestion, coughing, diaphoresis, fatigue, fever, headaches, joint swelling, myalgias, nausea, neck pain, numbness, rash, sore throat, swollen glands, urinary symptoms, vertigo, visual change, vomiting or weakness. Exacerbated by: turning head. She has tried nothing for the symptoms. The treatment provided mild relief.    No problem-specific Assessment & Plan notes found for this encounter.   Past Medical History:  Diagnosis Date  . Cancer ([place])    skin ca  . Hyperlipidemia   . Hypertension   . Hypothyroidism     Past Surgical History:  Procedure Laterality Date  . arm surgery Right 2013   pin placed from fracture  . BREAST BIOPSY Left    neg  . MELANOMA EXCISION    . REPLACEMENT TOTAL HIP W/  RESURFACING IMPLANTS Left   . TOTAL HIP ARTHROPLASTY Left     Family History  Problem Relation Age of Onset  . Breast cancer Sister 62    Social History   Social History  . Marital status: Widowed    Spouse name: N/A  . Number of children: N/A  . Years of education: N/A   Occupational History  . Not on file.   Social History Main Topics  . Smoking status: Never Smoker  . Smokeless tobacco: Never Used  . Alcohol use 4.2 oz/week    7 Glasses of wine per week  . Drug use: No  . Sexual activity: No   Other Topics Concern  . Not on file   Social History  Narrative   ** Merged History Encounter **        Allergies  Allergen Reactions  . Ace Inhibitors   . Lisinopril Other (See Comments)    Mouth and tongue swelling     Review of Systems  Constitutional: Negative for chills, diaphoresis, fatigue, fever, malaise/fatigue and weight loss.  HENT: Negative for congestion, ear discharge, ear pain, sore throat and tinnitus.   Eyes: Negative for blurred vision.  Respiratory: Negative for cough, sputum production, shortness of breath and wheezing.   Cardiovascular: Positive for palpitations. Negative for chest pain, orthopnea, leg swelling and PND.  Gastrointestinal: Negative for abdominal pain, anorexia, blood in stool, change in bowel habit, constipation, diarrhea, heartburn, melena, nausea and vomiting.  Genitourinary: Negative for dysuria, frequency, hematuria and urgency.  Musculoskeletal: Negative for arthralgias, back pain, joint pain, joint swelling, myalgias and neck pain.  Skin: Negative for rash.  Neurological: Positive for dizziness. Negative for vertigo, tingling, sensory change, focal weakness, weakness, numbness and headaches.  Endo/Heme/Allergies: Negative for environmental allergies and polydipsia. Does not bruise/bleed easily.  Psychiatric/Behavioral: Negative for depression and suicidal ideas. The patient is not nervous/anxious and does not have insomnia.      Objective  Vitals:   11/16/15 0955  BP: 122/70  Pulse: 80  Weight: 112 lb (50.8 kg)  Height: 5' (1.524 m)  Physical Exam  Constitutional: She is well-developed, well-nourished, and in no distress. No distress.  HENT:  Head: Normocephalic and atraumatic.  Right Ear: External ear normal.  Left Ear: External ear normal.  Nose: Nose normal.  Mouth/Throat: Oropharynx is clear and moist.  Eyes: Conjunctivae and EOM are normal. Pupils are equal, round, and reactive to light. Right eye exhibits no discharge. Left eye exhibits no discharge.  Neck: Normal range  of motion. Neck supple. No JVD present. No thyromegaly present.  Cardiovascular: Normal rate, regular rhythm, normal heart sounds and intact distal pulses.  Exam reveals no gallop, no S3, no S4 and no friction rub.   No murmur heard. Pulses:      Carotid pulses are 1+ on the right side, and 2+ on the left side. Pulmonary/Chest: Effort normal and breath sounds normal.  Abdominal: Soft. Bowel sounds are normal. She exhibits no mass. There is no tenderness. There is no guarding.  Musculoskeletal: Normal range of motion. She exhibits no edema.  Lymphadenopathy:    She has no cervical adenopathy.  Neurological: She is alert. She has normal strength and intact cranial nerves.  Skin: Skin is warm and dry. She is not diaphoretic.  Psychiatric: Mood and affect normal.  Nursing note and vitals reviewed.     Assessment & Plan  Problem List Items Addressed This Visit      Cardiovascular and Mediastinum   Benign essential HTN    Other Visit Diagnoses    Near syncope    -  Primary   Relevant Orders   EKG 12-Lead (Completed)   US Carotid Duplex Bilateral   Ambulatory referral to Cardiology   Pulse irregularity       Hypothyroidism, unspecified hypothyroidism type       Relevant Orders   TSH   Atrial fibrillation, unspecified type [facility])       Relevant Orders   Ambulatory referral to Cardiology   TSH        Dr. Otilio Miu [facility]  11/16/15

## 2015-11-21 ENCOUNTER — Other Ambulatory Visit: Payer: Self-pay

## 2015-11-27 ENCOUNTER — Other Ambulatory Visit: Payer: Self-pay

## 2015-11-27 DIAGNOSIS — I208 Other forms of angina pectoris: Secondary | ICD-10-CM | POA: Diagnosis not present

## 2015-11-27 DIAGNOSIS — I4891 Unspecified atrial fibrillation: Secondary | ICD-10-CM | POA: Diagnosis not present

## 2015-11-27 DIAGNOSIS — R0602 Shortness of breath: Secondary | ICD-10-CM | POA: Diagnosis not present

## 2015-11-27 MED ORDER — DILTIAZEM HCL ER 240 MG PO CP24: ORAL_CAPSULE | ORAL | 0 refills | Status: DC

## 2015-11-30 DIAGNOSIS — I4891 Unspecified atrial fibrillation: Secondary | ICD-10-CM | POA: Diagnosis not present

## 2015-11-30 DIAGNOSIS — R42 Dizziness and giddiness: Secondary | ICD-10-CM | POA: Diagnosis not present

## 2015-12-22 DIAGNOSIS — M0609 Rheumatoid arthritis without rheumatoid factor, multiple sites: Secondary | ICD-10-CM | POA: Diagnosis not present

## 2015-12-22 DIAGNOSIS — Z23 Encounter for immunization: Secondary | ICD-10-CM | POA: Diagnosis not present

## 2015-12-22 DIAGNOSIS — K900 Celiac disease: Secondary | ICD-10-CM | POA: Diagnosis not present

## 2015-12-22 DIAGNOSIS — I10 Essential (primary) hypertension: Secondary | ICD-10-CM | POA: Diagnosis not present

## 2015-12-22 DIAGNOSIS — I482 Chronic atrial fibrillation: Secondary | ICD-10-CM | POA: Diagnosis not present

## 2015-12-22 DIAGNOSIS — E784 Other hyperlipidemia: Secondary | ICD-10-CM | POA: Diagnosis not present

## 2015-12-22 DIAGNOSIS — Z79899 Other long term (current) drug therapy: Secondary | ICD-10-CM | POA: Diagnosis not present

## 2015-12-22 DIAGNOSIS — E039 Hypothyroidism, unspecified: Secondary | ICD-10-CM | POA: Diagnosis not present

## 2015-12-28 DIAGNOSIS — E784 Other hyperlipidemia: Secondary | ICD-10-CM | POA: Diagnosis not present

## 2015-12-28 DIAGNOSIS — I10 Essential (primary) hypertension: Secondary | ICD-10-CM | POA: Diagnosis not present

## 2015-12-28 DIAGNOSIS — D7589 Other specified diseases of blood and blood-forming organs: Secondary | ICD-10-CM | POA: Diagnosis not present

## 2015-12-28 DIAGNOSIS — I482 Chronic atrial fibrillation: Secondary | ICD-10-CM | POA: Diagnosis not present

## 2015-12-28 DIAGNOSIS — Z23 Encounter for immunization: Secondary | ICD-10-CM | POA: Diagnosis not present

## 2016-01-10 DIAGNOSIS — E784 Other hyperlipidemia: Secondary | ICD-10-CM | POA: Diagnosis not present

## 2016-01-10 DIAGNOSIS — R0602 Shortness of breath: Secondary | ICD-10-CM | POA: Diagnosis not present

## 2016-01-10 DIAGNOSIS — I482 Chronic atrial fibrillation: Secondary | ICD-10-CM | POA: Diagnosis not present

## 2016-02-19 DIAGNOSIS — E784 Other hyperlipidemia: Secondary | ICD-10-CM | POA: Diagnosis not present

## 2016-02-19 DIAGNOSIS — M0579 Rheumatoid arthritis with rheumatoid factor of multiple sites without organ or systems involvement: Secondary | ICD-10-CM | POA: Diagnosis not present

## 2016-02-19 DIAGNOSIS — I482 Chronic atrial fibrillation: Secondary | ICD-10-CM | POA: Diagnosis not present

## 2016-02-21 DIAGNOSIS — I10 Essential (primary) hypertension: Secondary | ICD-10-CM | POA: Diagnosis not present

## 2016-02-21 DIAGNOSIS — I482 Chronic atrial fibrillation: Secondary | ICD-10-CM | POA: Diagnosis not present

## 2016-02-21 DIAGNOSIS — E784 Other hyperlipidemia: Secondary | ICD-10-CM | POA: Diagnosis not present

## 2016-03-04 DIAGNOSIS — M069 Rheumatoid arthritis, unspecified: Secondary | ICD-10-CM | POA: Diagnosis not present

## 2016-03-04 DIAGNOSIS — M1990 Unspecified osteoarthritis, unspecified site: Secondary | ICD-10-CM | POA: Diagnosis not present

## 2016-03-04 DIAGNOSIS — R0602 Shortness of breath: Secondary | ICD-10-CM | POA: Diagnosis not present

## 2016-03-04 DIAGNOSIS — K219 Gastro-esophageal reflux disease without esophagitis: Secondary | ICD-10-CM | POA: Diagnosis not present

## 2016-03-04 DIAGNOSIS — R42 Dizziness and giddiness: Secondary | ICD-10-CM | POA: Diagnosis not present

## 2016-03-04 DIAGNOSIS — E0500 Thyrotoxicosis with diffuse goiter without thyrotoxic crisis or storm: Secondary | ICD-10-CM | POA: Diagnosis not present

## 2016-03-04 DIAGNOSIS — D649 Anemia, unspecified: Secondary | ICD-10-CM | POA: Diagnosis not present

## 2016-03-04 DIAGNOSIS — I208 Other forms of angina pectoris: Secondary | ICD-10-CM | POA: Diagnosis not present

## 2016-03-04 DIAGNOSIS — I480 Paroxysmal atrial fibrillation: Secondary | ICD-10-CM | POA: Diagnosis not present

## 2016-03-04 DIAGNOSIS — I4891 Unspecified atrial fibrillation: Secondary | ICD-10-CM | POA: Diagnosis not present

## 2016-04-23 DIAGNOSIS — M0579 Rheumatoid arthritis with rheumatoid factor of multiple sites without organ or systems involvement: Secondary | ICD-10-CM | POA: Diagnosis not present

## 2016-04-23 DIAGNOSIS — Z79899 Other long term (current) drug therapy: Secondary | ICD-10-CM | POA: Diagnosis not present

## 2016-05-02 ENCOUNTER — Ambulatory Visit
Admission: RE | Admit: 2016-05-02 | Discharge: 2016-05-02 | Disposition: A | Discharge status: Home / Self Care | Payer: Medicare Other | Source: Ambulatory Visit | Attending: Family Medicine | Admitting: Family Medicine

## 2016-05-02 ENCOUNTER — Ambulatory Visit (INDEPENDENT_AMBULATORY_CARE_PROVIDER_SITE_OTHER): Payer: Medicare Other | Admitting: Family Medicine

## 2016-05-02 ENCOUNTER — Encounter: Payer: Self-pay | Admitting: Family Medicine

## 2016-05-02 VITALS — BP 142/88 | HR 91 | Ht 63.0 in | Wt 115.0 lb

## 2016-05-02 DIAGNOSIS — I10 Essential (primary) hypertension: Secondary | ICD-10-CM

## 2016-05-02 DIAGNOSIS — I482 Chronic atrial fibrillation: Secondary | ICD-10-CM

## 2016-05-02 DIAGNOSIS — E039 Hypothyroidism, unspecified: Secondary | ICD-10-CM | POA: Diagnosis not present

## 2016-05-02 DIAGNOSIS — Z1239 Encounter for other screening for malignant neoplasm of breast: Secondary | ICD-10-CM

## 2016-05-02 DIAGNOSIS — E785 Hyperlipidemia, unspecified: Secondary | ICD-10-CM | POA: Diagnosis not present

## 2016-05-02 DIAGNOSIS — I4821 Permanent atrial fibrillation: Secondary | ICD-10-CM

## 2016-05-02 DIAGNOSIS — R0609 Other forms of dyspnea: Secondary | ICD-10-CM | POA: Insufficient documentation

## 2016-05-02 DIAGNOSIS — Z1231 Encounter for screening mammogram for malignant neoplasm of breast: Secondary | ICD-10-CM | POA: Diagnosis not present

## 2016-05-02 DIAGNOSIS — J9811 Atelectasis: Secondary | ICD-10-CM | POA: Diagnosis not present

## 2016-05-02 NOTE — Progress Notes (Signed)
Name: Andrea Kemp   MRN: [id number]    DOB: Oct 13, 1929   Date:05/02/2016       Progress Note  Subjective  Chief Complaint  Chief Complaint  Patient presents with  . Hypertension  . Atrial Fibrillation  . Hypothyroidism    Hypertension  This is a chronic problem. The problem has been waxing and waning since onset. The problem is controlled. Associated symptoms include palpitations and shortness of breath. Pertinent negatives include no anxiety, blurred vision, chest pain, headaches, malaise/fatigue, neck pain, orthopnea, peripheral edema, PND or sweats. (Dyspnea on '25mg'$  metoprolol) There are no associated agents to hypertension. Past treatments include beta blockers and calcium channel blockers. The current treatment provides moderate improvement. There are no compliance problems.  There is no history of angina, kidney disease, CAD/MI, CVA, heart failure, left ventricular hypertrophy, PVD, renovascular disease or retinopathy. Identifiable causes of hypertension include a thyroid problem. There is no history of chronic renal disease or a hypertension causing med.  Atrial Fibrillation  Presents for follow-up visit. Symptoms include hypertension, palpitations and shortness of breath. Symptoms are negative for bradycardia, chest pain, dizziness, hemodynamic instability, hypotension, pacemaker problem, syncope, tachycardia and weakness. (Patient on Eliquest and metoprolol) The symptoms have been stable. Past medical history includes atrial fibrillation. There is no history of CAD.  Thyroid Problem  Presents for follow-up visit. Symptoms include dry skin and palpitations. Patient reports no anxiety, cold intolerance, constipation, depressed mood, diaphoresis, diarrhea, fatigue, hair loss, heat intolerance, hoarse voice, leg swelling, menstrual problem, nail problem, tremors, visual change, weight gain or weight loss. The symptoms have been stable. Her past medical history is significant for  atrial fibrillation. There is no history of heart failure.  Shortness of Breath  This is a recurrent problem. The current episode started more than 1 month ago. The problem occurs daily. The problem has been waxing and waning. Pertinent negatives include no abdominal pain, chest pain, claudication, coryza, ear pain, fever, headaches, hemoptysis, leg swelling, neck pain, orthopnea, PND, rash, rhinorrhea, sore throat, sputum production, syncope or wheezing. Nothing aggravates the symptoms. The patient has no known risk factors for DVT/PE. There is no history of allergies, asthma, CAD, chronic lung disease, a heart failure or PE.    No problem-specific Assessment & Plan notes found for this encounter.   Past Medical History:  Diagnosis Date  . Cancer ([place])    skin ca  . Hyperlipidemia   . Hypertension   . Hypothyroidism     Past Surgical History:  Procedure Laterality Date  . arm surgery Right 2013   pin placed from fracture  . BREAST BIOPSY Left    neg  . MELANOMA EXCISION    . REPLACEMENT TOTAL HIP W/  RESURFACING IMPLANTS Left   . TOTAL HIP ARTHROPLASTY Left     Family History  Problem Relation Age of Onset  . Breast cancer Sister 29    Social History   Social History  . Marital status: Widowed    Spouse name: N/A  . Number of children: N/A  . Years of education: N/A   Occupational History  . Not on file.   Social History Main Topics  . Smoking status: Never Smoker  . Smokeless tobacco: Never Used  . Alcohol use 4.2 oz/week    7 Glasses of wine per week  . Drug use: No  . Sexual activity: No   Other Topics Concern  . Not on file   Social History Narrative   ** Merged History Encounter **  Allergies  Allergen Reactions  . Ace Inhibitors   . Lisinopril Other (See Comments)    Mouth and tongue swelling     Review of Systems  Constitutional: Negative for chills, diaphoresis, fatigue, fever, malaise/fatigue, weight gain and weight loss.  HENT:  Negative for ear discharge, ear pain, hoarse voice, rhinorrhea and sore throat.   Eyes: Negative for blurred vision.  Respiratory: Positive for shortness of breath. Negative for cough, hemoptysis, sputum production and wheezing.   Cardiovascular: Positive for palpitations. Negative for chest pain, orthopnea, claudication, leg swelling, syncope and PND.  Gastrointestinal: Negative for abdominal pain, blood in stool, constipation, diarrhea, heartburn, melena and nausea.  Genitourinary: Negative for dysuria, frequency, hematuria, menstrual problem and urgency.  Musculoskeletal: Negative for back pain, joint pain, myalgias and neck pain.  Skin: Negative for rash.  Neurological: Negative for dizziness, tingling, tremors, sensory change, focal weakness, weakness and headaches.  Endo/Heme/Allergies: Negative for environmental allergies, cold intolerance, heat intolerance and polydipsia. Does not bruise/bleed easily.  Psychiatric/Behavioral: Negative for depression and suicidal ideas. The patient is not nervous/anxious and does not have insomnia.      Objective  Vitals:   05/02/16 1452  BP: (!) 142/88  Pulse: 91  SpO2: 96%  Weight: 115 lb (52.2 kg)  Height: '5\' 3"'$  (1.6 m)    Physical Exam  Constitutional: She is well-developed, well-nourished, and in no distress. No distress.  HENT:  Head: Normocephalic and atraumatic.  Right Ear: External ear normal.  Left Ear: External ear normal.  Nose: Nose normal.  Mouth/Throat: Oropharynx is clear and moist.  Eyes: Conjunctivae and EOM are normal. Pupils are equal, round, and reactive to light. Right eye exhibits no discharge. Left eye exhibits no discharge.  Neck: Normal range of motion. Neck supple. No JVD present. No thyromegaly present.  Cardiovascular: Normal rate, regular rhythm, normal heart sounds and intact distal pulses.  Exam reveals no gallop and no friction rub.   No murmur heard. Pulmonary/Chest: Effort normal. She has decreased breath  sounds in the left lower field. She has no wheezes. She has no rhonchi. She has rales.      Abdominal: Soft. Bowel sounds are normal. She exhibits no mass. There is no tenderness. There is no guarding.  Musculoskeletal: Normal range of motion. She exhibits no edema.  Lymphadenopathy:    She has no cervical adenopathy.  Neurological: She is alert.  Skin: Skin is warm and dry. She is not diaphoretic.  Psychiatric: Mood and affect normal.  Nursing note and vitals reviewed.     Assessment & Plan  Problem List Items Addressed This Visit      Cardiovascular and Mediastinum   Benign essential HTN - Primary   Relevant Medications   ELIQUIS 2.5 MG TABS tablet   metoprolol tartrate (LOPRESSOR) 25 MG tablet   diltiazem (CARDIZEM CD) 240 MG 24 hr capsule     Other   HLD (hyperlipidemia)   Relevant Medications   ELIQUIS 2.5 MG TABS tablet   metoprolol tartrate (LOPRESSOR) 25 MG tablet   diltiazem (CARDIZEM CD) 240 MG 24 hr capsule    Other Visit Diagnoses    Hypothyroidism, unspecified type       Relevant Medications   ELIQUIS 2.5 MG TABS tablet   metoprolol tartrate (LOPRESSOR) 25 MG tablet   Other Relevant Orders   TSH   Dyspnea on exertion       Relevant Orders   DG Chest 2 View   Permanent atrial fibrillation ([place])  Relevant Medications   ELIQUIS 2.5 MG TABS tablet   metoprolol tartrate (LOPRESSOR) 25 MG tablet   diltiazem (CARDIZEM CD) 240 MG 24 hr capsule        Dr. [facility]  05/02/16

## 2016-05-03 LAB — TSH: TSH: 31.15 u[IU]/mL — ABNORMAL HIGH (ref 0.450–4.500)

## 2016-05-03 NOTE — Addendum Note (Signed)
Addended by: Fredderick Severance on: 05/03/2016 08:42 AM   Modules accepted: Orders

## 2016-05-07 ENCOUNTER — Other Ambulatory Visit: Payer: Self-pay

## 2016-05-07 MED ORDER — LEVOTHYROXINE SODIUM 100 MCG PO TABS: 88.0000 ug | ORAL_TABLET | Freq: Every day | ORAL | 1 refills | Status: DC

## 2016-05-09 ENCOUNTER — Inpatient Hospital Stay: Admission: RE | Admit: 2016-05-09 | Payer: Self-pay | Source: Ambulatory Visit

## 2016-05-09 DIAGNOSIS — R0602 Shortness of breath: Secondary | ICD-10-CM | POA: Diagnosis not present

## 2016-05-09 DIAGNOSIS — M150 Primary generalized (osteo)arthritis: Secondary | ICD-10-CM | POA: Diagnosis not present

## 2016-05-09 DIAGNOSIS — Z79899 Other long term (current) drug therapy: Secondary | ICD-10-CM | POA: Diagnosis not present

## 2016-05-09 DIAGNOSIS — M0579 Rheumatoid arthritis with rheumatoid factor of multiple sites without organ or systems involvement: Secondary | ICD-10-CM | POA: Diagnosis not present

## 2016-05-14 ENCOUNTER — Ambulatory Visit
Admission: RE | Admit: 2016-05-14 | Discharge: 2016-05-14 | Disposition: A | Discharge status: Home / Self Care | Payer: Medicare Other | Source: Ambulatory Visit | Attending: Family Medicine | Admitting: Family Medicine

## 2016-05-14 DIAGNOSIS — Z1231 Encounter for screening mammogram for malignant neoplasm of breast: Secondary | ICD-10-CM | POA: Diagnosis not present

## 2016-05-14 DIAGNOSIS — Z1239 Encounter for other screening for malignant neoplasm of breast: Secondary | ICD-10-CM

## 2016-05-15 ENCOUNTER — Other Ambulatory Visit: Payer: Self-pay

## 2016-05-17 DIAGNOSIS — H401112 Primary open-angle glaucoma, right eye, moderate stage: Secondary | ICD-10-CM | POA: Diagnosis not present

## 2016-05-20 DIAGNOSIS — I480 Paroxysmal atrial fibrillation: Secondary | ICD-10-CM | POA: Diagnosis not present

## 2016-05-20 DIAGNOSIS — R0602 Shortness of breath: Secondary | ICD-10-CM | POA: Diagnosis not present

## 2016-05-20 DIAGNOSIS — R42 Dizziness and giddiness: Secondary | ICD-10-CM | POA: Diagnosis not present

## 2016-05-20 DIAGNOSIS — K219 Gastro-esophageal reflux disease without esophagitis: Secondary | ICD-10-CM | POA: Diagnosis not present

## 2016-05-20 DIAGNOSIS — E0500 Thyrotoxicosis with diffuse goiter without thyrotoxic crisis or storm: Secondary | ICD-10-CM | POA: Diagnosis not present

## 2016-05-20 DIAGNOSIS — D649 Anemia, unspecified: Secondary | ICD-10-CM | POA: Diagnosis not present

## 2016-05-20 DIAGNOSIS — M1990 Unspecified osteoarthritis, unspecified site: Secondary | ICD-10-CM | POA: Diagnosis not present

## 2016-05-20 DIAGNOSIS — M069 Rheumatoid arthritis, unspecified: Secondary | ICD-10-CM | POA: Diagnosis not present

## 2016-05-20 DIAGNOSIS — I208 Other forms of angina pectoris: Secondary | ICD-10-CM | POA: Diagnosis not present

## 2016-07-05 DIAGNOSIS — H401112 Primary open-angle glaucoma, right eye, moderate stage: Secondary | ICD-10-CM | POA: Diagnosis not present

## 2016-07-25 ENCOUNTER — Encounter: Payer: Self-pay | Admitting: Family Medicine

## 2016-07-25 ENCOUNTER — Ambulatory Visit (INDEPENDENT_AMBULATORY_CARE_PROVIDER_SITE_OTHER): Payer: Medicare Other | Admitting: Family Medicine

## 2016-07-25 VITALS — BP 98/78 | HR 83 | Resp 16 | Ht 63.0 in | Wt 111.0 lb

## 2016-07-25 DIAGNOSIS — I10 Essential (primary) hypertension: Secondary | ICD-10-CM | POA: Diagnosis not present

## 2016-07-25 DIAGNOSIS — R69 Illness, unspecified: Secondary | ICD-10-CM

## 2016-07-25 DIAGNOSIS — I482 Chronic atrial fibrillation: Secondary | ICD-10-CM

## 2016-07-25 DIAGNOSIS — E785 Hyperlipidemia, unspecified: Secondary | ICD-10-CM

## 2016-07-25 DIAGNOSIS — E039 Hypothyroidism, unspecified: Secondary | ICD-10-CM | POA: Diagnosis not present

## 2016-07-25 DIAGNOSIS — I4821 Permanent atrial fibrillation: Secondary | ICD-10-CM

## 2016-07-25 MED ORDER — HYDROCHLOROTHIAZIDE 12.5 MG PO TABS: 12.5000 mg | ORAL_TABLET | Freq: Every day | ORAL | 3 refills | Status: DC

## 2016-07-25 MED ORDER — DILTIAZEM HCL ER COATED BEADS 240 MG PO CP24: 240.0000 mg | ORAL_CAPSULE | ORAL | 3 refills | Status: AC

## 2016-07-25 MED ORDER — METOPROLOL TARTRATE 25 MG PO TABS: 25.0000 mg | ORAL_TABLET | Freq: Two times a day (BID) | ORAL | 3 refills | Status: DC

## 2016-07-25 NOTE — Progress Notes (Signed)
Name: Andrea Kemp   MRN: [id number]    DOB: 1930/01/15   Date:07/26/2016       Progress Note  Subjective  Chief Complaint  Chief Complaint  Patient presents with  . Hypertension  . Thyroid Problem    Hypertension  This is a chronic problem. The current episode started more than 1 year ago. The problem has been waxing and waning since onset. The problem is controlled. Pertinent negatives include no anxiety, blurred vision, chest pain, headaches, malaise/fatigue, neck pain, orthopnea, palpitations, peripheral edema, PND, shortness of breath or sweats. There are no associated agents to hypertension. Risk factors for coronary artery disease include post-menopausal state. Past treatments include beta blockers, calcium channel blockers and diuretics. The current treatment provides moderate improvement. There are no compliance problems.  There is no history of angina, kidney disease, CAD/MI, CVA, heart failure, left ventricular hypertrophy, PVD or retinopathy. Identifiable causes of hypertension include a thyroid problem. There is no history of chronic renal disease, a hypertension causing med or renovascular disease.  Thyroid Problem  Presents for follow-up visit. Symptoms include cold intolerance, depressed mood and fatigue. Patient reports no anxiety, constipation, diaphoresis, diarrhea, dry skin, hair loss, heat intolerance, hoarse voice, leg swelling, menstrual problem, nail problem, palpitations, tremors, visual change, weight gain or weight loss. The symptoms have been stable. There is no history of heart failure.    No problem-specific Assessment & Plan notes found for this encounter.   Past Medical History:  Diagnosis Date  . Cancer ([place])    skin ca  . Hyperlipidemia   . Hypertension   . Hypothyroidism     Past Surgical History:  Procedure Laterality Date  . arm surgery Right 2013   pin placed from fracture  . BREAST BIOPSY Left    neg  . MELANOMA EXCISION    .  REPLACEMENT TOTAL HIP W/  RESURFACING IMPLANTS Left   . TOTAL HIP ARTHROPLASTY Left     Family History  Problem Relation Age of Onset  . Breast cancer Sister 37    Social History   Social History  . Marital status: Widowed    Spouse name: N/A  . Number of children: N/A  . Years of education: N/A   Occupational History  . Not on file.   Social History Main Topics  . Smoking status: Never Smoker  . Smokeless tobacco: Never Used  . Alcohol use 4.2 oz/week    7 Glasses of wine per week  . Drug use: No  . Sexual activity: No   Other Topics Concern  . Not on file   Social History Narrative   ** Merged History Encounter **        Allergies  Allergen Reactions  . Ace Inhibitors Other (See Comments)  . Lisinopril Other (See Comments) and Swelling    Tongue swelling and can't swallow Mouth and tongue swelling    Outpatient Medications Prior to Visit  Medication Sig Dispense Refill  . COMBIGAN 0.2-0.5 % ophthalmic solution     . ELIQUIS 2.5 MG TABS tablet Take 2.5 mg by mouth 1 day or 1 dose.    . latanoprost (XALATAN) 0.005 % ophthalmic solution Apply 1 drop to eye at bedtime.    . Ascorbic Acid (VITAMIN C PO) Take by mouth daily.    Marland Kitchen diltiazem (CARDIZEM CD) 240 MG 24 hr capsule Take 240 mg by mouth daily.    Marland Kitchen LUMIGAN 0.01 % SOLN     . Multiple Vitamins-Minerals (CENTRUM SILVER ADULT  50+ PO) Take 1 tablet by mouth daily.    . fluticasone (FLONASE) 50 MCG/ACT nasal spray Place 2 sprays into both nostrils as needed. 16 g 11  . folic acid (FOLVITE) 1 MG tablet Take 1 tablet by mouth daily.    Marland Kitchen levothyroxine (SYNTHROID, LEVOTHROID) 100 MCG tablet Take 1 tablet (100 mcg total) by mouth daily. Needed to change dosing on med due to lab 30 tablet 1  . methotrexate 2.5 MG tablet Take 2 tablets by mouth once a week.    . metoprolol tartrate (LOPRESSOR) 25 MG tablet Take 25 mg by mouth 2 (two) times daily after a meal. 1/2 tab     No facility-administered medications prior to  visit.     Review of Systems  Constitutional: Positive for fatigue. Negative for chills, diaphoresis, fever, malaise/fatigue, weight gain and weight loss.  HENT: Negative for ear discharge, ear pain, hoarse voice and sore throat.   Eyes: Negative for blurred vision.  Respiratory: Negative for cough, sputum production, shortness of breath and wheezing.   Cardiovascular: Negative for chest pain, palpitations, orthopnea, leg swelling and PND.  Gastrointestinal: Negative for abdominal pain, blood in stool, constipation, diarrhea, heartburn, melena and nausea.  Genitourinary: Negative for dysuria, frequency, hematuria, menstrual problem and urgency.  Musculoskeletal: Negative for back pain, joint pain, myalgias and neck pain.  Skin: Negative for rash.  Neurological: Negative for dizziness, tingling, tremors, sensory change, speech change, focal weakness, seizures, loss of consciousness and headaches.  Endo/Heme/Allergies: Positive for cold intolerance. Negative for environmental allergies, heat intolerance and polydipsia. Does not bruise/bleed easily.  Psychiatric/Behavioral: Negative for depression and suicidal ideas. The patient is not nervous/anxious and does not have insomnia.      Objective  Vitals:   07/25/16 1345  BP: 98/78  Pulse: 83  Resp: 16  SpO2: 98%  Weight: 111 lb (50.3 kg)  Height: '5\' 3"'$  (1.6 m)    Physical Exam  Constitutional: She is well-developed, well-nourished, and in no distress. No distress.  HENT:  Head: Normocephalic and atraumatic.  Right Ear: External ear normal.  Left Ear: External ear normal.  Nose: Nose normal.  Mouth/Throat: Oropharynx is clear and moist.  Eyes: Conjunctivae and EOM are normal. Pupils are equal, round, and reactive to light. Right eye exhibits no discharge. Left eye exhibits no discharge.  Neck: Normal range of motion. Neck supple. No JVD present. No thyromegaly present.  Cardiovascular: Normal rate, regular rhythm, normal heart  sounds and intact distal pulses.  Exam reveals no gallop and no friction rub.   No murmur heard. Pulmonary/Chest: Effort normal and breath sounds normal. She has no wheezes. She has no rales.  Abdominal: Soft. Bowel sounds are normal. She exhibits no mass. There is no tenderness. There is no guarding.  Musculoskeletal: Normal range of motion. She exhibits no edema.  Lymphadenopathy:    She has no cervical adenopathy.  Neurological: She is alert. She has normal reflexes.  Skin: Skin is warm and dry. She is not diaphoretic.  Psychiatric: Mood and affect normal.      Assessment & Plan  Problem List Items Addressed This Visit      Cardiovascular and Mediastinum   Benign essential HTN   Relevant Medications   diltiazem (CARDIZEM CD) 240 MG 24 hr capsule   hydrochlorothiazide (HYDRODIURIL) 12.5 MG tablet   metoprolol tartrate (LOPRESSOR) 25 MG tablet   Other Relevant Orders   Renal Function Panel   Permanent atrial fibrillation ([facility])   Relevant Medications   diltiazem (CARDIZEM  CD) 240 MG 24 hr capsule   hydrochlorothiazide (HYDRODIURIL) 12.5 MG tablet   metoprolol tartrate (LOPRESSOR) 25 MG tablet     Endocrine   Acquired hypothyroidism - Primary   Relevant Medications   levothyroxine (SYNTHROID, LEVOTHROID) 100 MCG tablet   metoprolol tartrate (LOPRESSOR) 25 MG tablet   Other Relevant Orders   TSH     Other   Hyperlipidemia   Relevant Medications   diltiazem (CARDIZEM CD) 240 MG 24 hr capsule   hydrochlorothiazide (HYDRODIURIL) 12.5 MG tablet   metoprolol tartrate (LOPRESSOR) 25 MG tablet   Other Relevant Orders   Lipid Profile   Taking medication for chronic disease   Relevant Orders   Hepatic Function Panel (6)      Meds ordered this encounter  Medications  . diltiazem (CARDIZEM CD) 240 MG 24 hr capsule    Sig: Take 1 capsule (240 mg total) by mouth daily.    Dispense:  90 capsule    Refill:  3  . hydrochlorothiazide (HYDRODIURIL) 12.5 MG tablet    Sig: Take  1 tablet (12.5 mg total) by mouth daily.    Dispense:  90 tablet    Refill:  3  . metoprolol tartrate (LOPRESSOR) 25 MG tablet    Sig: Take 1 tablet (25 mg total) by mouth 2 (two) times daily.    Dispense:  180 tablet    Refill:  3      Dr. Otilio Miu [facility]  07/26/16

## 2016-07-26 ENCOUNTER — Other Ambulatory Visit: Payer: Self-pay

## 2016-07-26 LAB — LIPID PANEL
CHOLESTEROL TOTAL: 176 mg/dL (ref 100–199)
Chol/HDL Ratio: 3 ratio (ref 0.0–4.4)
HDL: 58 mg/dL (ref >39)
LDL CALC: 93 mg/dL (ref 0–99)
TRIGLYCERIDES: 125 mg/dL (ref 0–149)
VLDL CHOLESTEROL CAL: 25 mg/dL (ref 5–40)

## 2016-07-26 LAB — HEPATIC FUNCTION PANEL (6)
ALK PHOS: 106 IU/L (ref 39–117)
ALT: 18 IU/L (ref 0–32)
AST: 29 IU/L (ref 0–40)
Bilirubin Total: 0.9 mg/dL (ref 0.0–1.2)
Bilirubin, Direct: 0.28 mg/dL (ref 0.00–0.40)

## 2016-07-26 LAB — RENAL FUNCTION PANEL
Albumin: 5.4 g/dL — ABNORMAL HIGH (ref 3.5–4.7)
BUN/Creatinine Ratio: 23 (ref 12–28)
BUN: 19 mg/dL (ref 8–27)
CALCIUM: 10.8 mg/dL — AB (ref 8.7–10.3)
CO2: 23 mmol/L (ref 18–29)
CREATININE: 0.84 mg/dL (ref 0.57–1.00)
Chloride: 96 mmol/L (ref 96–106)
GFR calc Af Amer: 74 mL/min/{1.73_m2} (ref >59)
GFR calc non Af Amer: 64 mL/min/{1.73_m2} (ref >59)
Glucose: 73 mg/dL (ref 65–99)
PHOSPHORUS: 4.3 mg/dL (ref 2.5–4.5)
Potassium: 3.9 mmol/L (ref 3.5–5.2)
Sodium: 143 mmol/L (ref 134–144)

## 2016-07-26 LAB — TSH: TSH: 5.66 u[IU]/mL — ABNORMAL HIGH (ref 0.450–4.500)

## 2016-07-26 MED ORDER — LEVOTHYROXINE SODIUM 112 MCG PO TABS: 112.0000 ug | ORAL_TABLET | Freq: Every day | ORAL | 0 refills | Status: DC

## 2016-10-24 ENCOUNTER — Ambulatory Visit (INDEPENDENT_AMBULATORY_CARE_PROVIDER_SITE_OTHER): Payer: Medicare Other | Admitting: Family Medicine

## 2016-10-24 ENCOUNTER — Encounter: Payer: Self-pay | Admitting: Family Medicine

## 2016-10-24 VITALS — BP 120/80 | HR 60 | Ht 63.0 in | Wt 110.0 lb

## 2016-10-24 DIAGNOSIS — E039 Hypothyroidism, unspecified: Secondary | ICD-10-CM

## 2016-10-24 NOTE — Progress Notes (Signed)
Name: Andrea Kemp   MRN: [id number]    DOB: 1929-07-17   Date:10/24/2016       Progress Note  Subjective  Chief Complaint  Chief Complaint  Patient presents with  . Hypothyroidism    changed to 112 on levothyroxine in May- here for a recheck on thyroid    Thyroid Problem  Presents for follow-up visit. Symptoms include cold intolerance and fatigue. Patient reports no anxiety, constipation, depressed mood, diaphoresis, diarrhea, dry skin, hair loss, heat intolerance, hoarse voice, leg swelling, nail problem, palpitations, tremors, visual change, weight gain or weight loss. The symptoms have been stable.    No problem-specific Assessment & Plan notes found for this encounter.   Past Medical History:  Diagnosis Date  . Cancer ([place])    skin ca  . Hyperlipidemia   . Hypertension   . Hypothyroidism     Past Surgical History:  Procedure Laterality Date  . arm surgery Right 2013   pin placed from fracture  . BREAST BIOPSY Left    neg  . MELANOMA EXCISION    . REPLACEMENT TOTAL HIP W/  RESURFACING IMPLANTS Left   . TOTAL HIP ARTHROPLASTY Left     Family History  Problem Relation Age of Onset  . Breast cancer Sister 33    Social History   Social History  . Marital status: Widowed    Spouse name: N/A  . Number of children: N/A  . Years of education: N/A   Occupational History  . Not on file.   Social History Main Topics  . Smoking status: Never Smoker  . Smokeless tobacco: Never Used  . Alcohol use 4.2 oz/week    7 Glasses of wine per week  . Drug use: No  . Sexual activity: No   Other Topics Concern  . Not on file   Social History Narrative   ** Merged History Encounter **        Allergies  Allergen Reactions  . Ace Inhibitors Other (See Comments)  . Lisinopril Other (See Comments) and Swelling    Tongue swelling and can't swallow Mouth and tongue swelling    Outpatient Medications Prior to Visit  Medication Sig Dispense Refill  .  albuterol (PROVENTIL HFA;VENTOLIN HFA) 108 (90 Base) MCG/ACT inhaler Inhale into the lungs.    . COMBIGAN 0.2-0.5 % ophthalmic solution     . diltiazem (CARDIZEM CD) 240 MG 24 hr capsule Take 1 capsule (240 mg total) by mouth daily. 90 capsule 3  . ELIQUIS 2.5 MG TABS tablet Take 2.5 mg by mouth 1 day or 1 dose.    . fluticasone (FLONASE) 50 MCG/ACT nasal spray Place into the nose.    . folic acid (FOLVITE) 1 MG tablet TAKE (1) TABLET BY MOUTH EVERY DAY    . hydrochlorothiazide (HYDRODIURIL) 12.5 MG tablet Take 1 tablet (12.5 mg total) by mouth daily. 90 tablet 3  . latanoprost (XALATAN) 0.005 % ophthalmic solution Apply 1 drop to eye at bedtime.    Marland Kitchen levothyroxine (SYNTHROID, LEVOTHROID) 112 MCG tablet Take 1 tablet (112 mcg total) by mouth daily. 90 tablet 0  . metoprolol tartrate (LOPRESSOR) 25 MG tablet Take 1 tablet (25 mg total) by mouth 2 (two) times daily. 180 tablet 3   No facility-administered medications prior to visit.     Review of Systems  Constitutional: Positive for fatigue. Negative for chills, diaphoresis, fever, malaise/fatigue, weight gain and weight loss.  HENT: Negative for ear discharge, ear pain, hoarse voice and sore throat.  Eyes: Negative for blurred vision.  Respiratory: Negative for cough, sputum production, shortness of breath and wheezing.   Cardiovascular: Negative for chest pain, palpitations and leg swelling.  Gastrointestinal: Negative for abdominal pain, blood in stool, constipation, diarrhea, heartburn, melena and nausea.  Genitourinary: Negative for dysuria, frequency, hematuria and urgency.  Musculoskeletal: Negative for back pain, joint pain, myalgias and neck pain.  Skin: Negative for rash.  Neurological: Negative for dizziness, tingling, tremors, sensory change, focal weakness and headaches.  Endo/Heme/Allergies: Positive for cold intolerance. Negative for environmental allergies, heat intolerance and polydipsia. Does not bruise/bleed easily.   Psychiatric/Behavioral: Negative for depression and suicidal ideas. The patient is not nervous/anxious and does not have insomnia.      Objective  Vitals:   10/24/16 1029  BP: 120/80  Pulse: 60  Weight: 110 lb (49.9 kg)  Height: 5\' 3"  (1.6 m)    Physical Exam  Constitutional: She is well-developed, well-nourished, and in no distress. No distress.  HENT:  Head: Normocephalic and atraumatic.  Right Ear: External ear normal.  Left Ear: External ear normal.  Nose: Nose normal.  Mouth/Throat: Oropharynx is clear and moist.  Eyes: Pupils are equal, round, and reactive to light. Conjunctivae and EOM are normal. Right eye exhibits no discharge. Left eye exhibits no discharge.  Neck: Normal range of motion. Neck supple. Normal carotid pulses, no hepatojugular reflux and no JVD present. Carotid bruit is not present. No thyroid mass and no thyromegaly present.  Cardiovascular: Normal rate, regular rhythm, normal heart sounds and intact distal pulses.  Exam reveals no gallop and no friction rub.   No murmur heard. Pulmonary/Chest: Effort normal and breath sounds normal. She has no wheezes. She has no rales.  Abdominal: Soft. Bowel sounds are normal. She exhibits no mass. There is no tenderness. There is no guarding.  Musculoskeletal: Normal range of motion. She exhibits no edema.  Lymphadenopathy:    She has no cervical adenopathy.  Neurological: She is alert. She has normal reflexes.  Skin: Skin is warm and dry. She is not diaphoretic.  Psychiatric: Mood and affect normal.  Nursing note and vitals reviewed.     Assessment & Plan  Problem List Items Addressed This Visit      Endocrine   Acquired hypothyroidism - Primary   Relevant Orders   TSH      No orders of the defined types were placed in this encounter.     Dr. [facility]  10/24/16

## 2016-10-25 ENCOUNTER — Other Ambulatory Visit: Payer: Self-pay

## 2016-10-25 LAB — TSH: TSH: 1.69 u[IU]/mL (ref 0.450–4.500)

## 2016-10-25 MED ORDER — LEVOTHYROXINE SODIUM 112 MCG PO TABS: 112.0000 ug | ORAL_TABLET | Freq: Every day | ORAL | 1 refills | Status: DC

## 2016-11-06 DIAGNOSIS — Z79899 Other long term (current) drug therapy: Secondary | ICD-10-CM | POA: Diagnosis not present

## 2016-11-06 DIAGNOSIS — M0579 Rheumatoid arthritis with rheumatoid factor of multiple sites without organ or systems involvement: Secondary | ICD-10-CM | POA: Diagnosis not present

## 2016-11-06 DIAGNOSIS — K219 Gastro-esophageal reflux disease without esophagitis: Secondary | ICD-10-CM | POA: Diagnosis not present

## 2016-11-06 DIAGNOSIS — D649 Anemia, unspecified: Secondary | ICD-10-CM | POA: Diagnosis not present

## 2016-11-06 DIAGNOSIS — I208 Other forms of angina pectoris: Secondary | ICD-10-CM | POA: Diagnosis not present

## 2016-11-06 DIAGNOSIS — M069 Rheumatoid arthritis, unspecified: Secondary | ICD-10-CM | POA: Diagnosis not present

## 2016-11-06 DIAGNOSIS — E0500 Thyrotoxicosis with diffuse goiter without thyrotoxic crisis or storm: Secondary | ICD-10-CM | POA: Diagnosis not present

## 2016-11-06 DIAGNOSIS — R0602 Shortness of breath: Secondary | ICD-10-CM | POA: Diagnosis not present

## 2016-11-06 DIAGNOSIS — I480 Paroxysmal atrial fibrillation: Secondary | ICD-10-CM | POA: Diagnosis not present

## 2016-11-06 DIAGNOSIS — M1990 Unspecified osteoarthritis, unspecified site: Secondary | ICD-10-CM | POA: Diagnosis not present

## 2016-11-06 DIAGNOSIS — I4891 Unspecified atrial fibrillation: Secondary | ICD-10-CM | POA: Diagnosis not present

## 2016-11-06 DIAGNOSIS — R42 Dizziness and giddiness: Secondary | ICD-10-CM | POA: Diagnosis not present

## 2016-11-08 DIAGNOSIS — H401112 Primary open-angle glaucoma, right eye, moderate stage: Secondary | ICD-10-CM | POA: Diagnosis not present

## 2016-11-14 DIAGNOSIS — M0579 Rheumatoid arthritis with rheumatoid factor of multiple sites without organ or systems involvement: Secondary | ICD-10-CM | POA: Diagnosis not present

## 2016-11-14 DIAGNOSIS — M150 Primary generalized (osteo)arthritis: Secondary | ICD-10-CM | POA: Diagnosis not present

## 2016-11-14 DIAGNOSIS — Z79899 Other long term (current) drug therapy: Secondary | ICD-10-CM | POA: Diagnosis not present

## 2016-12-30 ENCOUNTER — Encounter: Payer: Self-pay | Admitting: Family Medicine

## 2017-01-20 ENCOUNTER — Other Ambulatory Visit: Payer: Self-pay

## 2017-01-25 ENCOUNTER — Other Ambulatory Visit: Payer: Self-pay | Admitting: Family Medicine

## 2017-01-31 ENCOUNTER — Ambulatory Visit
Admission: RE | Admit: 2017-01-31 | Discharge: 2017-01-31 | Disposition: A | Discharge status: Home / Self Care | Payer: Medicare Other | Source: Ambulatory Visit | Attending: Family Medicine | Admitting: Family Medicine

## 2017-01-31 ENCOUNTER — Encounter: Payer: Self-pay | Admitting: Family Medicine

## 2017-01-31 ENCOUNTER — Ambulatory Visit (INDEPENDENT_AMBULATORY_CARE_PROVIDER_SITE_OTHER): Payer: Medicare Other | Admitting: Family Medicine

## 2017-01-31 VITALS — BP 120/82 | HR 72 | Ht 63.0 in | Wt 112.0 lb

## 2017-01-31 DIAGNOSIS — I10 Essential (primary) hypertension: Secondary | ICD-10-CM | POA: Diagnosis not present

## 2017-01-31 DIAGNOSIS — I517 Cardiomegaly: Secondary | ICD-10-CM | POA: Diagnosis not present

## 2017-01-31 DIAGNOSIS — R079 Chest pain, unspecified: Secondary | ICD-10-CM | POA: Diagnosis not present

## 2017-01-31 DIAGNOSIS — R918 Other nonspecific abnormal finding of lung field: Secondary | ICD-10-CM | POA: Diagnosis not present

## 2017-01-31 DIAGNOSIS — I482 Chronic atrial fibrillation: Secondary | ICD-10-CM

## 2017-01-31 DIAGNOSIS — R0609 Other forms of dyspnea: Secondary | ICD-10-CM | POA: Insufficient documentation

## 2017-01-31 DIAGNOSIS — I700 Atherosclerosis of aorta: Secondary | ICD-10-CM | POA: Diagnosis not present

## 2017-01-31 DIAGNOSIS — J189 Pneumonia, unspecified organism: Secondary | ICD-10-CM | POA: Diagnosis not present

## 2017-01-31 DIAGNOSIS — I4821 Permanent atrial fibrillation: Secondary | ICD-10-CM

## 2017-01-31 DIAGNOSIS — Z23 Encounter for immunization: Secondary | ICD-10-CM | POA: Diagnosis not present

## 2017-01-31 MED ORDER — AZITHROMYCIN 250 MG PO TABS: ORAL_TABLET | ORAL | 0 refills | Status: AC

## 2017-01-31 MED ORDER — LEVOTHYROXINE SODIUM 112 MCG PO TABS: ORAL_TABLET | ORAL | 1 refills | Status: AC

## 2017-01-31 MED ORDER — NITROGLYCERIN 0.4 MG SL SUBL: 0.4000 mg | SUBLINGUAL_TABLET | SUBLINGUAL | 3 refills | Status: AC | PRN

## 2017-01-31 MED ORDER — METOPROLOL TARTRATE 25 MG PO TABS: 25.0000 mg | ORAL_TABLET | Freq: Two times a day (BID) | ORAL | 1 refills | Status: AC

## 2017-01-31 MED ORDER — HYDROCHLOROTHIAZIDE 12.5 MG PO TABS: 12.5000 mg | ORAL_TABLET | Freq: Every day | ORAL | 1 refills | Status: AC

## 2017-01-31 NOTE — Progress Notes (Signed)
Name: Andrea Kemp   MRN: [id number]    DOB: 1929-06-06   Date:01/31/2017       Progress Note  Subjective  Chief Complaint  Chief Complaint  Patient presents with  . Hypertension  . Hypothyroidism  . Ear Pain    R) ear soreness that gets worse when in airplane    Hypertension  This is a chronic problem. The current episode started more than 1 year ago. The problem has been waxing and waning since onset. The problem is controlled. Associated symptoms include chest pain, malaise/fatigue, palpitations and shortness of breath. Pertinent negatives include no anxiety, blurred vision, headaches, neck pain, orthopnea, peripheral edema, PND or sweats. There are no associated agents to hypertension. Past treatments include ACE inhibitors, calcium channel blockers and beta blockers. The current treatment provides moderate improvement. There are no compliance problems.  Hypertensive end-organ damage includes angina. There is no history of kidney disease, CAD/MI, CVA, heart failure, left ventricular hypertrophy, PVD or retinopathy. Identifiable causes of hypertension include a thyroid problem.  Thyroid Problem  Presents for follow-up visit. Symptoms include fatigue and palpitations. Patient reports no anxiety, cold intolerance, constipation, depressed mood, diaphoresis, diarrhea, dry skin, hair loss, heat intolerance, hoarse voice, leg swelling, nail problem, tremors, visual change, weight gain or weight loss. The symptoms have been stable. There is no history of heart failure.  Chest Pain   This is a new problem. The current episode started 1 to 4 weeks ago (2 wks). The onset quality is gradual. The problem occurs daily. The pain is present in the substernal region. The quality of the pain is described as pressure and tightness. The pain does not radiate. Associated symptoms include exertional chest pressure, malaise/fatigue, palpitations and shortness of breath. Pertinent negatives include no  abdominal pain, back pain, claudication, cough, diaphoresis, dizziness, fever, headaches, hemoptysis, irregular heartbeat, nausea, orthopnea, PND or sputum production.  Her past medical history is significant for hypertension and thyroid problem.  Pertinent negatives for past medical history include no PVD.    No problem-specific Assessment & Plan notes found for this encounter.   Past Medical History:  Diagnosis Date  . Cancer ([place])    skin ca  . Hyperlipidemia   . Hypertension   . Hypothyroidism     Past Surgical History:  Procedure Laterality Date  . arm surgery Right 2013   pin placed from fracture  . BREAST BIOPSY Left    neg  . MELANOMA EXCISION    . REPLACEMENT TOTAL HIP W/  RESURFACING IMPLANTS Left   . TOTAL HIP ARTHROPLASTY Left     Family History  Problem Relation Age of Onset  . Breast cancer Sister 27    Social History   Socioeconomic History  . Marital status: Widowed    Spouse name: Not on file  . Number of children: Not on file  . Years of education: Not on file  . Highest education level: Not on file  Social Needs  . Financial resource strain: Not on file  . Food insecurity - worry: Not on file  . Food insecurity - inability: Not on file  . Transportation needs - medical: Not on file  . Transportation needs - non-medical: Not on file  Occupational History  . Not on file  Tobacco Use  . Smoking status: Never Smoker  . Smokeless tobacco: Never Used  Substance and Sexual Activity  . Alcohol use: Yes    Alcohol/week: 4.2 oz    Types: 7 Glasses of wine per  week  . Drug use: No  . Sexual activity: No  Other Topics Concern  . Not on file  Social History Narrative   ** Merged History Encounter **        Allergies  Allergen Reactions  . Ace Inhibitors Other (See Comments)  . Lisinopril Other (See Comments) and Swelling    Tongue swelling and can't swallow Mouth and tongue swelling    Outpatient Medications Prior to Visit  Medication  Sig Dispense Refill  . albuterol (PROVENTIL HFA;VENTOLIN HFA) 108 (90 Base) MCG/ACT inhaler Inhale into the lungs.    . COMBIGAN 0.2-0.5 % ophthalmic solution     . diltiazem (CARDIZEM CD) 240 MG 24 hr capsule Take 1 capsule (240 mg total) by mouth daily. (Patient taking differently: Take 240 mg daily by mouth. Dr Clayborn Bigness) 90 capsule 3  . ELIQUIS 2.5 MG TABS tablet Take 2.5 mg 2 (two) times daily by mouth. Dr Clayborn Bigness    . fluticasone (FLONASE) 50 MCG/ACT nasal spray Place into the nose.    . folic acid (FOLVITE) 1 MG tablet TAKE (1) TABLET BY MOUTH EVERY DAY- Dr Clayborn Bigness    . latanoprost (XALATAN) 0.005 % ophthalmic solution Apply 1 drop to eye at bedtime.    . methotrexate (RHEUMATREX) 2.5 MG tablet Take 4 tablets once a week by mouth. Dr Jefm Bryant    . hydrochlorothiazide (HYDRODIURIL) 12.5 MG tablet Take 1 tablet (12.5 mg total) by mouth daily. 90 tablet 3  . levothyroxine (SYNTHROID, LEVOTHROID) 112 MCG tablet TAKE (1) TABLET BY MOUTH EVERY DAY 90 tablet 0  . metoprolol tartrate (LOPRESSOR) 25 MG tablet Take 1 tablet (25 mg total) by mouth 2 (two) times daily. 180 tablet 3   No facility-administered medications prior to visit.     Review of Systems  Constitutional: Positive for fatigue and malaise/fatigue. Negative for chills, diaphoresis, fever, weight gain and weight loss.  HENT: Negative for ear discharge, ear pain, hoarse voice and sore throat.   Eyes: Negative for blurred vision.  Respiratory: Positive for shortness of breath. Negative for cough, hemoptysis, sputum production and wheezing.   Cardiovascular: Positive for chest pain and palpitations. Negative for orthopnea, claudication, leg swelling and PND.  Gastrointestinal: Negative for abdominal pain, blood in stool, constipation, diarrhea, heartburn, melena and nausea.  Genitourinary: Negative for dysuria, frequency, hematuria and urgency.  Musculoskeletal: Negative for back pain, joint pain, myalgias and neck pain.  Skin:  Negative for rash.  Neurological: Negative for dizziness, tingling, tremors, sensory change, focal weakness and headaches.  Endo/Heme/Allergies: Negative for environmental allergies, cold intolerance, heat intolerance and polydipsia. Does not bruise/bleed easily.  Psychiatric/Behavioral: Negative for depression and suicidal ideas. The patient is not nervous/anxious and does not have insomnia.      Objective  Vitals:   01/31/17 1003  BP: 120/82  Pulse: 72  Weight: 112 lb (50.8 kg)  Height: 5\' 3"  (1.6 m)    Physical Exam  Constitutional: She is well-developed, well-nourished, and in no distress. No distress.  HENT:  Head: Normocephalic and atraumatic.  Right Ear: External ear normal.  Left Ear: External ear normal.  Nose: Nose normal.  Mouth/Throat: Oropharynx is clear and moist.  Eyes: Conjunctivae and EOM are normal. Pupils are equal, round, and reactive to light. Right eye exhibits no discharge. Left eye exhibits no discharge.  Neck: Normal range of motion. Neck supple. No JVD present. No thyromegaly present.  Cardiovascular: Normal rate, regular rhythm, normal heart sounds and intact distal pulses. Exam reveals no gallop and no  friction rub.  No murmur heard. Pulmonary/Chest: Effort normal. She has decreased breath sounds in the right lower field. She has no wheezes. She has no rales.  Abdominal: Soft. Bowel sounds are normal. She exhibits no mass. There is no tenderness. There is no guarding.  Musculoskeletal: Normal range of motion. She exhibits no edema.  Lymphadenopathy:    She has no cervical adenopathy.  Neurological: She is alert. She has normal reflexes.  Skin: Skin is warm and dry. She is not diaphoretic.  Psychiatric: Mood and affect normal.  Nursing note and vitals reviewed.     Assessment & Plan  Problem List Items Addressed This Visit      Cardiovascular and Mediastinum   Benign essential HTN   Relevant Medications   hydrochlorothiazide (HYDRODIURIL)  12.5 MG tablet   metoprolol tartrate (LOPRESSOR) 25 MG tablet   nitroGLYCERIN (NITROSTAT) 0.4 MG SL tablet   Permanent atrial fibrillation (HCC)   Relevant Medications   hydrochlorothiazide (HYDRODIURIL) 12.5 MG tablet   metoprolol tartrate (LOPRESSOR) 25 MG tablet   nitroGLYCERIN (NITROSTAT) 0.4 MG SL tablet    Other Visit Diagnoses    Chest pain on exertion    -  Primary   Relevant Medications   nitroGLYCERIN (NITROSTAT) 0.4 MG SL tablet   Other Relevant Orders   EKG 12-Lead (Completed)   Ambulatory referral to Cardiology   DOE (dyspnea on exertion)       Relevant Orders   Ambulatory referral to Cardiology   DG Chest 2 View   Influenza vaccine needed       Relevant Orders   Flu vaccine HIGH DOSE PF (Completed)      Meds ordered this encounter  Medications  . hydrochlorothiazide (HYDRODIURIL) 12.5 MG tablet    Sig: Take 1 tablet (12.5 mg total) daily by mouth.    Dispense:  90 tablet    Refill:  1  . metoprolol tartrate (LOPRESSOR) 25 MG tablet    Sig: Take 1 tablet (25 mg total) 2 (two) times daily by mouth.    Dispense:  180 tablet    Refill:  1  . levothyroxine (SYNTHROID, LEVOTHROID) 112 MCG tablet    Sig: TAKE (1) TABLET BY MOUTH EVERY DAY    Dispense:  90 tablet    Refill:  1  . nitroGLYCERIN (NITROSTAT) 0.4 MG SL tablet    Sig: Place 1 tablet (0.4 mg total) every 5 (five) minutes as needed under the tongue for chest pain.    Dispense:  50 tablet    Refill:  3      Dr. Otilio Miu [facility]  01/31/17

## 2017-01-31 NOTE — Addendum Note (Signed)
Addended by: Fredderick Severance on: 01/31/2017 02:17 PM   Modules accepted: Orders

## 2017-02-03 DIAGNOSIS — K219 Gastro-esophageal reflux disease without esophagitis: Secondary | ICD-10-CM | POA: Diagnosis not present

## 2017-02-03 DIAGNOSIS — E0500 Thyrotoxicosis with diffuse goiter without thyrotoxic crisis or storm: Secondary | ICD-10-CM | POA: Diagnosis not present

## 2017-02-03 DIAGNOSIS — R079 Chest pain, unspecified: Secondary | ICD-10-CM | POA: Diagnosis not present

## 2017-02-03 DIAGNOSIS — M1990 Unspecified osteoarthritis, unspecified site: Secondary | ICD-10-CM | POA: Diagnosis not present

## 2017-02-03 DIAGNOSIS — I4891 Unspecified atrial fibrillation: Secondary | ICD-10-CM | POA: Diagnosis not present

## 2017-02-03 DIAGNOSIS — R42 Dizziness and giddiness: Secondary | ICD-10-CM | POA: Diagnosis not present

## 2017-02-03 DIAGNOSIS — I480 Paroxysmal atrial fibrillation: Secondary | ICD-10-CM | POA: Diagnosis not present

## 2017-02-03 DIAGNOSIS — M069 Rheumatoid arthritis, unspecified: Secondary | ICD-10-CM | POA: Diagnosis not present

## 2017-02-03 DIAGNOSIS — I208 Other forms of angina pectoris: Secondary | ICD-10-CM | POA: Diagnosis not present

## 2017-02-03 DIAGNOSIS — R0602 Shortness of breath: Secondary | ICD-10-CM | POA: Diagnosis not present

## 2017-02-20 ENCOUNTER — Inpatient Hospital Stay
Admission: EM | Admit: 2017-02-20 | Discharge: 2017-02-21 | DRG: 378 | Disposition: A | Discharge status: Home / Self Care | Payer: Medicare Other | Attending: Internal Medicine | Admitting: Internal Medicine

## 2017-02-20 ENCOUNTER — Emergency Department: Payer: Medicare Other

## 2017-02-20 DIAGNOSIS — I361 Nonrheumatic tricuspid (valve) insufficiency: Secondary | ICD-10-CM | POA: Diagnosis not present

## 2017-02-20 DIAGNOSIS — Z79890 Hormone replacement therapy (postmenopausal): Secondary | ICD-10-CM | POA: Diagnosis not present

## 2017-02-20 DIAGNOSIS — Z79899 Other long term (current) drug therapy: Secondary | ICD-10-CM | POA: Diagnosis not present

## 2017-02-20 DIAGNOSIS — Z7901 Long term (current) use of anticoagulants: Secondary | ICD-10-CM | POA: Diagnosis not present

## 2017-02-20 DIAGNOSIS — R0602 Shortness of breath: Secondary | ICD-10-CM | POA: Diagnosis not present

## 2017-02-20 DIAGNOSIS — D259 Leiomyoma of uterus, unspecified: Secondary | ICD-10-CM | POA: Diagnosis not present

## 2017-02-20 DIAGNOSIS — E876 Hypokalemia: Secondary | ICD-10-CM | POA: Diagnosis not present

## 2017-02-20 DIAGNOSIS — Z85820 Personal history of malignant melanoma of skin: Secondary | ICD-10-CM | POA: Diagnosis not present

## 2017-02-20 DIAGNOSIS — K922 Gastrointestinal hemorrhage, unspecified: Secondary | ICD-10-CM | POA: Diagnosis not present

## 2017-02-20 DIAGNOSIS — I110 Hypertensive heart disease with heart failure: Secondary | ICD-10-CM | POA: Diagnosis not present

## 2017-02-20 DIAGNOSIS — K529 Noninfective gastroenteritis and colitis, unspecified: Secondary | ICD-10-CM | POA: Diagnosis not present

## 2017-02-20 DIAGNOSIS — Z8701 Personal history of pneumonia (recurrent): Secondary | ICD-10-CM | POA: Diagnosis not present

## 2017-02-20 DIAGNOSIS — K921 Melena: Secondary | ICD-10-CM | POA: Diagnosis not present

## 2017-02-20 DIAGNOSIS — Z888 Allergy status to other drugs, medicaments and biological substances status: Secondary | ICD-10-CM | POA: Diagnosis not present

## 2017-02-20 DIAGNOSIS — I509 Heart failure, unspecified: Secondary | ICD-10-CM | POA: Diagnosis not present

## 2017-02-20 DIAGNOSIS — I5022 Chronic systolic (congestive) heart failure: Secondary | ICD-10-CM | POA: Diagnosis not present

## 2017-02-20 DIAGNOSIS — I10 Essential (primary) hypertension: Secondary | ICD-10-CM | POA: Diagnosis not present

## 2017-02-20 DIAGNOSIS — J90 Pleural effusion, not elsewhere classified: Secondary | ICD-10-CM | POA: Diagnosis not present

## 2017-02-20 DIAGNOSIS — I480 Paroxysmal atrial fibrillation: Secondary | ICD-10-CM | POA: Diagnosis not present

## 2017-02-20 DIAGNOSIS — I517 Cardiomegaly: Secondary | ICD-10-CM | POA: Diagnosis not present

## 2017-02-20 DIAGNOSIS — E039 Hypothyroidism, unspecified: Secondary | ICD-10-CM | POA: Diagnosis present

## 2017-02-20 DIAGNOSIS — I340 Nonrheumatic mitral (valve) insufficiency: Secondary | ICD-10-CM | POA: Diagnosis not present

## 2017-02-20 DIAGNOSIS — I071 Rheumatic tricuspid insufficiency: Secondary | ICD-10-CM | POA: Diagnosis present

## 2017-02-20 DIAGNOSIS — J9811 Atelectasis: Secondary | ICD-10-CM | POA: Diagnosis not present

## 2017-02-20 DIAGNOSIS — I4891 Unspecified atrial fibrillation: Secondary | ICD-10-CM | POA: Diagnosis not present

## 2017-02-20 DIAGNOSIS — K6389 Other specified diseases of intestine: Secondary | ICD-10-CM | POA: Diagnosis not present

## 2017-02-20 DIAGNOSIS — R918 Other nonspecific abnormal finding of lung field: Secondary | ICD-10-CM | POA: Diagnosis not present

## 2017-02-20 LAB — CBC AND DIFFERENTIAL
Basophils %: 0.9 % (ref 0.0–3.0)
Basophils Absolute: 0.1 10*3/uL (ref 0.0–0.3)
Eosinophils %: 0.4 % (ref 0.0–7.0)
Eosinophils Absolute: 0 10*3/uL (ref 0.0–0.8)
Hematocrit: 42.5 % (ref 36.0–48.0)
Hemoglobin: 14.3 gm/dL (ref 12.0–16.0)
Lymphocytes Absolute: 2.1 10*3/uL (ref 0.6–5.1)
Lymphocytes: 28.6 % (ref 15.0–46.0)
MCH: 35 pg (ref 28–35)
MCHC: 34 gm/dL (ref 32–36)
MCV: 105 fL — ABNORMAL HIGH (ref 80–100)
MPV: 8.7 fL (ref 6.0–10.0)
Monocytes Absolute: 0.4 10*3/uL (ref 0.1–1.7)
Monocytes: 5.8 % (ref 3.0–15.0)
Neutrophils %: 64.4 % (ref 42.0–78.0)
Neutrophils Absolute: 4.7 10*3/uL (ref 1.7–8.6)
PLT CT: 188 10*3/uL (ref 130–440)
RBC: 4.04 10*6/uL (ref 3.80–5.00)
RDW: 13.6 % (ref 11.0–14.0)
WBC: 7.2 10*3/uL (ref 4.0–11.0)

## 2017-02-20 LAB — PT AND APTT
PT INR: 1.1 (ref 0.5–1.3)
PT: 11.5 s (ref 9.5–11.5)
aPTT: 28.3 s (ref 24.0–34.0)

## 2017-02-20 LAB — B-TYPE NATRIURETIC PEPTIDE: B-Natriuretic Peptide: 761.9 pg/mL — ABNORMAL HIGH (ref 0.0–100.0)

## 2017-02-20 LAB — HEPATIC FUNCTION PANEL
ALT: 16 U/L (ref 0–55)
AST (SGOT): 32 U/L (ref 10–42)
Albumin/Globulin Ratio: 1.63 Ratio — ABNORMAL HIGH (ref 0.70–1.50)
Albumin: 4.4 gm/dL (ref 3.5–5.0)
Alkaline Phosphatase: 103 U/L (ref 40–145)
Bilirubin Direct: 0.6 mg/dL — ABNORMAL HIGH (ref 0.0–0.3)
Bilirubin, Total: 1.4 mg/dL — ABNORMAL HIGH (ref 0.1–1.2)
Globulin: 2.7 gm/dL (ref 2.0–4.0)
Protein, Total: 7.1 gm/dL (ref 6.0–8.3)

## 2017-02-20 LAB — VH URINALYSIS WITH MICROSCOPIC AND CULTURE IF INDICATED
Bilirubin, UA: NEGATIVE
Blood, UA: NEGATIVE
Glucose, UA: NEGATIVE mg/dL
Ketones UA: NEGATIVE mg/dL
Leukocyte Esterase, UA: NEGATIVE Leu/uL
Nitrite, UA: NEGATIVE
Protein, UR: NEGATIVE mg/dL
Urine Specific Gravity: 1.047 — ABNORMAL HIGH (ref 1.001–1.040)
Urobilinogen, UA: NORMAL mg/dL
pH, Urine: 7 pH (ref 5.0–8.0)

## 2017-02-20 LAB — BASIC METABOLIC PANEL
Anion Gap: 13.1 mMol/L (ref 7.0–18.0)
BUN / Creatinine Ratio: 20.8 Ratio (ref 10.0–30.0)
BUN: 15 mg/dL (ref 7–22)
CO2: 26 mMol/L (ref 20.0–30.0)
Calcium: 9.9 mg/dL (ref 8.5–10.5)
Chloride: 99 mMol/L (ref 98–110)
Creatinine: 0.72 mg/dL (ref 0.60–1.20)
EGFR: 77 mL/min/{1.73_m2} (ref 60–150)
Glucose: 106 mg/dL — ABNORMAL HIGH (ref 71–99)
Osmolality Calc: 271 mOsm/kg — ABNORMAL LOW (ref 275–300)
Potassium: 3.1 mMol/L — ABNORMAL LOW (ref 3.5–5.3)
Sodium: 135 mMol/L — ABNORMAL LOW (ref 136–147)

## 2017-02-20 LAB — I-STAT LACTIC ACID
Lactic Acid I-Stat: 1.7 mMol/L (ref 0.50–2.10)
Room Number I-Stat: 28

## 2017-02-20 LAB — VH I-STAT LACTIC ACID NOTIFICATION

## 2017-02-20 MED ORDER — DILTIAZEM HCL ER COATED BEADS 240 MG PO CP24
240.00 mg | ORAL_CAPSULE | Freq: Every morning | ORAL | Status: DC
Start: 2017-02-21 — End: 2017-02-21
  Administered 2017-02-21: 09:00:00 240 mg via ORAL
  Filled 2017-02-20: qty 1

## 2017-02-20 MED ORDER — METOPROLOL TARTRATE 25 MG PO TABS
25.00 mg | ORAL_TABLET | Freq: Two times a day (BID) | ORAL | Status: DC
Start: 2017-02-20 — End: 2017-02-21
  Administered 2017-02-20 – 2017-02-21 (×3): 25 mg via ORAL
  Filled 2017-02-20 (×4): qty 1

## 2017-02-20 MED ORDER — SODIUM CHLORIDE 0.9 % IJ SOLN
3.00 mL | Freq: Three times a day (TID) | INTRAMUSCULAR | Status: DC
Start: 2017-02-20 — End: 2017-02-21
  Administered 2017-02-20 – 2017-02-21 (×4): 3 mL via INTRAVENOUS

## 2017-02-20 MED ORDER — LEVOTHYROXINE SODIUM 112 MCG PO TABS
112.00 ug | ORAL_TABLET | Freq: Every morning | ORAL | Status: DC
Start: 2017-02-20 — End: 2017-02-21
  Administered 2017-02-20 – 2017-02-21 (×2): 112 ug via ORAL
  Filled 2017-02-20 (×2): qty 1

## 2017-02-20 MED ORDER — SENNOSIDES-DOCUSATE SODIUM 8.6-50 MG PO TABS
2.00 | ORAL_TABLET | Freq: Every evening | ORAL | Status: DC
Start: 2017-02-20 — End: 2017-02-20
  Filled 2017-02-20: qty 2

## 2017-02-20 MED ORDER — FOLIC ACID 1 MG PO TABS
1.00 mg | ORAL_TABLET | Freq: Every day | ORAL | Status: DC
Start: 2017-02-20 — End: 2017-02-21
  Administered 2017-02-20 – 2017-02-21 (×2): 1 mg via ORAL
  Filled 2017-02-20 (×2): qty 1

## 2017-02-20 MED ORDER — VH POTASSIUM CHLORIDE CRYS ER 20 MEQ PO TBCR (WRAP)
EXTENDED_RELEASE_TABLET | ORAL | Status: AC
Start: 2017-02-20 — End: ?
  Filled 2017-02-20: qty 2

## 2017-02-20 MED ORDER — NON FORMULARY
3000.00 [IU] | Freq: Three times a day (TID) | Status: DC
Start: 2017-02-20 — End: 2017-02-21
  Administered 2017-02-20: 20:00:00 3000 [IU] via ORAL

## 2017-02-20 MED ORDER — IOHEXOL 350 MG/ML IV SOLN
70.00 mL | Freq: Once | INTRAVENOUS | Status: AC | PRN
Start: 2017-02-20 — End: 2017-02-20
  Administered 2017-02-20: 11:00:00 70 mL via INTRAVENOUS

## 2017-02-20 MED ORDER — FLUTICASONE PROPIONATE 50 MCG/ACT NA SUSP
1.00 | Freq: Every day | NASAL | Status: DC
Start: 2017-02-20 — End: 2017-02-21
  Administered 2017-02-20 – 2017-02-21 (×2): 1 via NASAL
  Filled 2017-02-20: qty 16

## 2017-02-20 MED ORDER — SODIUM CHLORIDE 0.9 % IV BOLUS
1000.00 mL | Freq: Once | INTRAVENOUS | Status: AC
Start: 2017-02-20 — End: 2017-02-20
  Administered 2017-02-20: 11:00:00 1000 mL via INTRAVENOUS

## 2017-02-20 MED ORDER — METRONIDAZOLE 250 MG PO TABS
500.00 mg | ORAL_TABLET | Freq: Three times a day (TID) | ORAL | Status: DC
Start: 2017-02-20 — End: 2017-02-21
  Administered 2017-02-20 – 2017-02-21 (×3): 500 mg via ORAL
  Filled 2017-02-20 (×5): qty 2

## 2017-02-20 MED ORDER — VH POTASSIUM CHLORIDE CRYS ER 20 MEQ PO TBCR (WRAP)
40.00 meq | EXTENDED_RELEASE_TABLET | Freq: Once | ORAL | Status: AC
Start: 2017-02-20 — End: 2017-02-20
  Administered 2017-02-20: 11:00:00 40 meq via ORAL

## 2017-02-20 MED ORDER — FUROSEMIDE 10 MG/ML IJ SOLN
40.00 mg | Freq: Once | INTRAMUSCULAR | Status: AC
Start: 2017-02-20 — End: 2017-02-20
  Administered 2017-02-20: 17:00:00 40 mg via INTRAVENOUS

## 2017-02-20 MED ORDER — ONDANSETRON HCL 4 MG/2ML IJ SOLN
4.00 mg | Freq: Three times a day (TID) | INTRAMUSCULAR | Status: DC | PRN
Start: 2017-02-20 — End: 2017-02-21

## 2017-02-20 MED ORDER — SODIUM CHLORIDE 0.9 % IJ SOLN
0.40 mg | INTRAMUSCULAR | Status: DC | PRN
Start: 2017-02-20 — End: 2017-02-21

## 2017-02-20 MED ORDER — LATANOPROST 0.005 % OP SOLN
1.00 [drp] | Freq: Every evening | OPHTHALMIC | Status: DC
Start: 2017-02-20 — End: 2017-02-21
  Administered 2017-02-20: 21:00:00 1 [drp] via OPHTHALMIC
  Filled 2017-02-20: qty 1

## 2017-02-20 MED ORDER — VH BIO-K PLUS PROBIOTIC 50 BIL CFU CAPSULE
50.00 | DELAYED_RELEASE_CAPSULE | Freq: Every day | ORAL | Status: DC
Start: 2017-02-20 — End: 2017-02-21
  Administered 2017-02-20 – 2017-02-21 (×2): 50 via ORAL
  Filled 2017-02-20 (×2): qty 1

## 2017-02-20 MED ORDER — METOPROLOL TARTRATE 50 MG PO TABS
ORAL_TABLET | ORAL | Status: AC
Start: 2017-02-20 — End: ?
  Filled 2017-02-20: qty 1

## 2017-02-20 MED ORDER — ONDANSETRON 4 MG PO TBDP
4.00 mg | ORAL_TABLET | Freq: Three times a day (TID) | ORAL | Status: DC | PRN
Start: 2017-02-20 — End: 2017-02-21

## 2017-02-20 MED ORDER — DOCUSATE SODIUM 100 MG PO CAPS
100.00 mg | ORAL_CAPSULE | Freq: Every day | ORAL | Status: DC
Start: 2017-02-21 — End: 2017-02-20

## 2017-02-20 MED ORDER — CIPROFLOXACIN HCL 500 MG PO TABS
500.00 mg | ORAL_TABLET | Freq: Two times a day (BID) | ORAL | Status: DC
Start: 2017-02-20 — End: 2017-02-21
  Administered 2017-02-20 – 2017-02-21 (×2): 500 mg via ORAL
  Filled 2017-02-20 (×3): qty 1

## 2017-02-20 MED ORDER — HYDRALAZINE HCL 20 MG/ML IJ SOLN
10.00 mg | Freq: Four times a day (QID) | INTRAMUSCULAR | Status: DC | PRN
Start: 2017-02-20 — End: 2017-02-21

## 2017-02-20 MED ORDER — FUROSEMIDE 10 MG/ML IJ SOLN
INTRAMUSCULAR | Status: AC
Start: 2017-02-20 — End: ?
  Filled 2017-02-20: qty 4

## 2017-02-20 NOTE — ED Provider Notes (Signed)
[facility] EMERGENCY DEPARTMENT  History and Physical Exam          Patient: Kristina [name]  Encounter Date: 02/20/2017    DOB: 10-11-1929  Age/Sex: 83 y.o. female    MRN: [id number]  Room: S28/S28-A    PCP: Patsy Lager, MD  Attending: Magdalene Molly, MD        H&P (loc / qual / severity / dura / tim) ROS       Kristina [name] is a 83 y.o. female who presents with chief complaint of Abd pain. Location generalized, quality crampy, severity mild-to-moderate, duration today.    Pt was feeling fine yesterday but then noticed abd crampiness around 2 or 3:00 in the morning. And then later this morning the pt began to have bloody bowel movements that were bright red. Pt is on a liquids for her age she'll fibrillation. Pt says she's not previously had any bloody bowel movements or issues.           HPI/ROS limits: none  Hx given by: patient and family    Review of Systems   Constitutional: Negative for fever.   Respiratory: Positive for shortness of breath. Negative for cough.    Cardiovascular: Negative for chest pain.   Gastrointestinal: Positive for abdominal pain, blood in stool, diarrhea and nausea. Negative for vomiting.   Genitourinary: Negative for dysuria.   Skin: Negative for rash.   Neurological: Negative for headaches.   All other systems reviewed and are negative.           ALL / MEDS / PMH / PSH / PFH / SH     Pt is allergic to lisinopril.    No current facility-administered medications on file prior to encounter.      No current outpatient prescriptions on file prior to encounter.     She has a past medical history of A-fib; Hypertension; and Melanoma.    She has a past surgical history that includes Hip surgery and arm surgery.    She reports that she has never smoked. She has never used smokeless tobacco. She reports that she drinks alcohol. She reports that she does not use drugs.    Her family history is not on file.         Physical Exam       Constitutional: Patient appears comfortable.   Eyes: Pupils  equal. EOMI.   ENMT: NL appearance of ears/nose. MMM.   Respiratory: Lungs are clear bilaterally. No WOB.   Cardiovascular: Heart RRR. No leg edema bilaterally.   GI/GU: Abdomen is soft, NT; no guarding or rigidity.  Rectal exam is trace positive.   Skin: Skin is warm and dry.   Neurologic: Awake and alert. Memory intact.   Psychiatric: Normal affect and insight; no agitation.   Musculoskeletal: Neck is supple. Trachea is midline.     Blood pressure (!) 153/98, pulse (!) 51, temperature 97.8 F (36.6 C), temperature source Oral, resp. rate 16, height 1.6 m, weight 50.8 kg, SpO2 95 %.         Labs and Imaging     Labs  Labs Reviewed   BASIC METABOLIC PANEL - Abnormal; Notable for the following:        Result Value    Sodium 135 (*)     Potassium 3.1 (*)     Glucose 106 (*)     Osmolality Calc 271 (*)     All other components within normal limits   CBC AND  DIFFERENTIAL - Abnormal; Notable for the following:     MCV 105 (*)     All other components within normal limits   VH URINALYSIS WITH MICROSCOPIC AND CULTURE IF INDICATED       - Abnormal; Notable for the following:     Specific Gravity, UR 1.047 (*)     All other components within normal limits   HEPATIC FUNCTION PANEL - Abnormal; Notable for the following:     Bilirubin, Total 1.4 (*)     Bilirubin, Direct 0.6 (*)     Albumin/Globulin Ratio 1.63 (*)     All other components within normal limits   B-TYPE NATRIURETIC PEPTIDE - Abnormal; Notable for the following:     B-Natriuretic Peptide 761.9 (*)     All other components within normal limits   PT AND APTT   VH I-STAT LACTIC ACID NOTIFICATION   I-STAT LACTIC ACID       Radiologic Studies  Ct Chest W Contrast (trauma)    Result Date: 02/20/2017  IMPRESSION: Contrast CT of the chest showing a large right effusion, which is free-flowing, without evidence of underlying pleural enhancement or nodularity to suggest an exudate by CT. Areas of subsegmental atelectasis as described above, without evidence of endobronchial  lesion or mass. Very significant right atrium and right ventricular enlargement with signs of right heart failure. Associated left atrial enhancement could indicate a combined right and left involvement. ReadingStation:[email or web address]    Xr Chest Ap Portable    Result Date: 02/20/2017  FINDINGS/IMPRESSION: Prominent consolidation of the right middle and right lower lobe, with underlying effusion, possibly partially loculated within the major fissure.. If a CT of the contrast is contemplated, it must be performed with IV contrast to characterize the consolidation and pleural process ReadingStation:[email or web address]      ED Medication Orders     Start Ordered     Status Ordering Provider    02/20/17 1037 02/20/17 1036  potassium chloride (K-DUR,KLOR-CON) CR tablet 40 mEq  Once in ED     Route: Oral  Ordered Dose: 40 mEq     Last MAR action:  Given Loriana Samad B    02/20/17 1036 02/20/17 1035  sodium chloride 0.9 % bolus 1,000 mL  Once in ED     Route: Intravenous  Ordered Dose: 1,000 mL     Last MAR action:  Stopped Samon Dishner B            EKG: See Epiphany for formal interpretation.         Procedures & Critical Care / MDM     No procedures or critical care.    The differential diagnosis includes, but is not limited to HEMORRHOID, DIVERTICULOSIS, ULCER, ISCHEMIC COLITIS, ESOPHAGEAL VARICES.    9:16 AM  I will go ahead and do a rectal look at labs pt will likely need admission.    9:31 AM  The Hemoccult exam was just slightly positive. The stool on the whole was negative but there was a small streak that colored blue.         Labs / imaging were reviewed and explained to the patient. All questions have been answered. Pt is appreciative of care.         Diagnosis       Final diagnoses:   Acute GI bleeding   Acute CHF  Large right pleural effusion  Hypokalemia      ED Disposition     ED Disposition Condition Date/Time Comment  Admit  Thu Feb 20, 2017  1:28 PM           In addition to the above history, please see nursing notes.   Allergies, meds, past medical, family, social hx, and the results of the diagnostic studies performed have been reviewed.  This is note has been created using an EMR that may contain additions or subtractions not intended by myself, Magdalene Molly, MD.              Magdalene Molly, MD  02/26/17 [phone]

## 2017-02-20 NOTE — ED Notes (Signed)
Bed: CD32-A  Expected date:   Expected time:   Means of arrival:   Comments:  S 28 @ 1445

## 2017-02-20 NOTE — Plan of Care (Signed)
NURSE NOTE SUMMARY     Patient Name: Kristina [name]   Attending Physician: Jamison Oka *   Primary Care Physician: Patsy Lager, MD   Date of Admission:   02/20/2017   Today's date:   02/20/2017 LOS: 0 days   Shift Summary:                                                              Received patient at 1800hrs, alert and oriented,denise painsettled her in bed, assessments completed due meds given   Provider Notifications:      Rapid Response Notifications:  Mobility:        PMP Activity: Step 6 - Walks in Room (02/20/2017  7:38 PM)     Weight tracking:  Family Dynamic:     Last 3 Weights for the past 72 hrs (Last 3 readings):   Weight   02/20/17 1957 50.6 kg (111 lb 8 oz)   02/20/17 0829 50.8 kg (112 lb)       Recent Vitals:  Active Problems:     BP 117/75   Pulse 98   Temp 97.3 F (36.3 C) (Oral)   Resp 20   Ht 1.6 m (5\' 3" )   Wt 50.6 kg (111 lb 8 oz)   SpO2 92%   BMI 19.75 kg/m          Principal Problem:    Hematochezia  Active Problems:    PAF (paroxysmal atrial fibrillation)    HTN (hypertension)    Hypokalemia    Pleural effusion, right

## 2017-02-20 NOTE — Plan of Care (Signed)
Advance Care Planning Services - [facility]      Patient Name: Kristina [name],Kristina [name] LOS: 0 days   Attending Physician: Kristina Molly, MD   Primary Care Physician: Kristina Lager, MD   Narrative of Meeting [facility]   Events prompting this discussion:  Effusion, hematochezia, hospital admission    Participants:  Patient, daughter Kristina [name]    The types of care discussed:  Standard of care for disease and Resuscitation with CPR    Patient/Surrogate wishes and goals for treatment:  I have discussed about healthcare proxy/POA with the patient. Patient wants  Daughter Kristina [name] to be medical decision maker if patient cant make medical decisions.   Also discussed about long term goals of care given multiple medical co-morbidites (as mentioned above) and code status including CPR/intubation/vasopressors and BiPAP. Patient wants to     [x ] DNR with support (no CPR, no intubation but vasopressors and/or BiPAP ok)    Order placed in EPIC.    Active Problem List  Principal Problem:    Hematochezia  Active Problems:    PAF (paroxysmal atrial fibrillation)    HTN (hypertension)    Hypokalemia    Pleural effusion, right       Documents Filled Out As Part of Discussion               [facility] Hospitalists   None     The patient has  medical decision making capacity.     The patient/surrogate was informed that this session is completely voluntary and was given the opportunity to decline the ACP services. The patient/surrogate made the decision to participate in the ACP session with the discussion proceeding as described above.     Reference:  Detering et al, BMJ 2010; 340 doi: PopPath.it (Published 08 June 2008)  Advance care planning:    Improves end of life care   Improves patient & family satisfaction   Reduces stress, anxiety, & depression in surviving relatives   [phone] - first 30 minutes face-to-face    [phone] - each additional 30 minutes face-to-face    I have spent 18 minutes on  face-to-face Advance Care Planning Services   100% of the time was spent on discussion and counseling the patient and daughter.   No active management of the problems listed above was undertaken during the time period reported.            MRN: [id number]           CSN: [id number]  DOB: [birth year]   Roanna Epley, MD  02/20/17 5:05 PM   [facility]

## 2017-02-20 NOTE — Plan of Care (Signed)
Problem: Impaired Mobility  Goal: Mobility/Activity is maintained at optimal level for patient  Outcome: Progressing

## 2017-02-20 NOTE — H&P (Addendum)
ADMISSION HISTORY AND PHYSICAL EXAM    Date Time: 02/20/17 3:00 PM  Patient Name: Kristina [name]  Attending Physician: Magdalene Molly, MD  Primary Care Physician: Patsy Lager, MD    CC: Bloody stools    History of Presenting Illness:   Kristina [name] is a 83 y.o. female who presents to the hospital with  Bloody stools, also shortness of breath.  This a patient with history of paroxysmal atrial fibrillation, hypertension, hypothyroidism, she takes a liquid is on a daily basis, she lives in [place] but she is visiting her daughter and she might end up staying in [place], she had a pneumonia approximately 3-4 weeks ago and was treated with antibiotics, she has had    residual mild shortness of breath but it wasn't significant, she has been afebrile, she went to bed feeling okay but at 3am  she started having abdominal pain that she describes as pressure and cramping, at 6 AM she went to the bathroom and had soft bowel movement with a small amount of blood at the end, then she had 2 more episodes of liquid stools with bright red blood mixed with them. No mucus. After that the patient came to the ER and since then the abdominal pain has resolved, she is currently eating, she hasn't had any nausea or vomiting.  She says she's been diagnosed with celiac disease and she had colonoscopy approximately 10 years ago  In the ER she had x-ray that was abnormal and then she had CT of the chest that showed moderate right effusion with atelectasis, no masses seen    Past Medical History:     Past Medical History:   Diagnosis Date   . A-fib    . Hypertension    . Melanoma         hypothyroidism    Past Surgical History:     Past Surgical History:   Procedure Laterality Date   . arm surgery      roght   . HIP SURGERY      left       Family History:    Denies significant family history    Social History:     Social History     Social History   . Marital status: Widowed     Spouse name: N/A   . Number of  children: N/A   . Years of education: N/A     Occupational History   . Not on file.     Social History Main Topics   . Smoking status: Never Smoker   . Smokeless tobacco: Never Used   . Alcohol use Yes      Comment: 1 glass wine daily   . Drug use: No   . Sexual activity: Not on file     Other Topics Concern   . Not on file     Social History Narrative   . No narrative on file       Allergies:     Allergies   Allergen Reactions   . Lisinopril Anaphylaxis       Medications:     Current Facility-Administered Medications:   .  furosemide (LASIX) injection 40 mg, 40 mg, Intravenous, Once in ED, Magdalene Molly, MD    Current Outpatient Prescriptions:   .  dilTIAZem (CARDIZEM CD) 240 MG 24 hr capsule, Take 240 mg by mouth every morning.  , Disp: , Rfl:   .  ELIQUIS 2.5 MG, Take 2.5 mg by  mouth every 12 (twelve) hours.  , Disp: , Rfl:   .  fluticasone (FLONASE) 50 MCG/ACT nasal spray, 1 spray by Nasal route daily as needed for Rhinitis or Allergies.  , Disp: , Rfl:   .  folic acid (FOLVITE) 1 MG tablet, Take 1 mg by mouth daily., Disp: , Rfl:   .  hydroCHLOROthiazide (MICROZIDE) 12.5 MG capsule, Take 12.5 mg by mouth every morning.  , Disp: , Rfl:   .  latanoprost (XALATAN) 0.005 % ophthalmic solution, Place 1 drop into both eyes nightly.  , Disp: , Rfl:   .  levothyroxine (SYNTHROID, LEVOTHROID) 112 MCG tablet, Take 112 mcg by mouth every morning.  , Disp: , Rfl:   .  LUMIGAN 0.01 % Solution, Place 1 drop into both eyes nightly., Disp: , Rfl:   .  metoprolol tartrate (LOPRESSOR) 25 MG tablet, Take 25 mg by mouth 2 (two) times daily.  , Disp: , Rfl:   .  PROAIR HFA 108 (90 Base) MCG/ACT inhaler, Inhale 2 puffs into the lungs 2 (two) times daily as needed for Wheezing or Shortness of Breath.  , Disp: , Rfl:   .  SIMBRINZA 1-0.2 % Suspension, Place 1 drop into both eyes every morning., Disp: , Rfl:   .  methotrexate 2.5 MG tablet, Take 10 mg by mouth Once a week on Monday evening.  , Disp: , Rfl:   .  nitroglycerin  (NITROSTAT) 0.4 MG SL tablet, Place 0.4 mg under the tongue every 5 (five) minutes as needed for Chest pain.  , Disp: , Rfl:      Review of Systems:   All other systems were reviewed and negative except as noted above in HPI.       Physical Exam:   BP (!) 156/110   Pulse (!) 51   Temp 97.8 F (36.6 C) (Oral)   Resp 16   Ht 1.6 m (5\' 3" )   Wt 50.8 kg (112 lb)   SpO2 95%   BMI 19.84 kg/m    Body mass index is 19.84 kg/m.    Constitutional: awake, alert, oriented x 3; no acute distress. Chronically ill-looking elderly female  Eyes: perrla, eomi, sclera anicteric   ENT: oropharynx clear without lesions, mucous membranes moist  Neck: supple, no lymphadenopathy, no thyromegaly, no JVD, no carotid bruits  Cardiovascular: regular rate and rhythm, no murmurs, rubs or gallops  Lungs:  No distress, decreased breath sounds on the right base  Abdomen: soft, non-tender, non-distended; no palpable masses, no hepatosplenomegaly, normoactive bowel sounds, no rebound or guarding  Extremities: no clubbing, cyanosis, or edema    Neuro: cranial nerves grossly intact, strength 5/5 in upper and lower extremities sensation intact,   Skin: no rashes or lesions noted  Psychiatric: Awake, alert, orientedx3    Labs:     Results     Procedure Component Value Units Date/Time    BNP [[id number]]  (Abnormal) Collected:  02/20/17 0850    Specimen:  Blood Updated:  02/20/17 1204     B-Natriuretic Peptide 761.9 (H) pg/mL     Urinalysis w Microscopic and Culture if Indicated [[id number]]  (Abnormal) Collected:  02/20/17 1129    Specimen:  Urine, Random Updated:  02/20/17 1201     Color, UA Straw     Clarity, UA Clear     Specific Gravity, UR 1.047 (H)     pH, Urine 7.0 pH      Protein, UR Negative mg/dL      Glucose,  UA Negative mg/dL      Ketones UA Negative mg/dL      Bilirubin, UA Negative     Blood, UA Negative     Nitrite, UA Negative     Urobilinogen, UA Normal mg/dL      Leukocyte Esterase, UA Negative Leu/uL     CBC and differential  [[id number]]  (Abnormal) Collected:  02/20/17 0850    Specimen:  Blood from Blood Updated:  02/20/17 1034     WBC 7.2 K/cmm      RBC 4.04 M/cmm      Hemoglobin 14.3 gm/dL      Hematocrit 62.9 %      MCV 105 (H) fL      MCH 35 pg      MCHC 34 gm/dL      RDW 52.8 %      PLT CT 188 K/cmm      MPV 8.7 fL      NEUTROPHIL % 64.4 %      Lymphocytes 28.6 %      Monocytes 5.8 %      Eosinophils % 0.4 %      Basophils % 0.9 %      Neutrophils Absolute 4.7 K/cmm      Lymphocytes Absolute 2.1 K/cmm      Monocytes Absolute 0.4 K/cmm      Eosinophils Absolute 0.0 K/cmm      BASO Absolute 0.1 K/cmm     LFTs [[id number]]  (Abnormal) Collected:  02/20/17 0850    Specimen:  Plasma Updated:  02/20/17 1033     Protein, Total 7.1 gm/dL      Albumin 4.4 gm/dL      Alkaline Phosphatase 103 U/L      ALT 16 U/L      AST (SGOT) 32 U/L      Bilirubin, Total 1.4 (H) mg/dL      Bilirubin, Direct 0.6 (H) mg/dL      Albumin/Globulin Ratio 1.63 (H) Ratio      Globulin 2.7 gm/dL     Basic Metabolic Panel [[id number]]  (Abnormal) Collected:  02/20/17 0850    Specimen:  Plasma Updated:  02/20/17 1032     Sodium 135 (L) mMol/L      Potassium 3.1 (L) mMol/L      Chloride 99 mMol/L      CO2 26.0 mMol/L      Calcium 9.9 mg/dL      Glucose 034 (H) mg/dL      Creatinine 7.42 mg/dL      BUN 15 mg/dL      Anion Gap 59.5 mMol/L      BUN/Creatinine Ratio 20.8 Ratio      EGFR 77 mL/min/1.28m2      Osmolality Calc 271 (L) mOsm/kg     PT/APTT [[id number]] Collected:  02/20/17 0850    Specimen:  Blood Updated:  02/20/17 0959     PT 11.5 sec      PT INR 1.1     aPTT 28.3 sec     i-Stat Lactic AcID [[id number]] Collected:  02/20/17 0908    Specimen:  Venipuncture Updated:  02/20/17 0912     Room Number, ISTAT 28     Sample, ISTAT Venous     Site, ISTAT OTHER     i-STAT Lactic acid 1.70 mMol/L     I-Stat Lactic Acid [[id number]] Collected:  02/20/17 0904    Specimen:  ISTAT Updated:  02/20/17 0908     I-STAT Notification Istat  Notification          Imaging:     Radiology  Results (24 Hour)     Procedure Component Value Units Date/Time    CT Chest W Contrast (Trauma) [[id number]] Collected:  02/20/17 1128    Order Status:  Completed Updated:  02/20/17 1137    Narrative:       CLINICAL HISTORY:  Reason For Exam: abnormal CXR  Pt c/o abdominal pain onset last pm, as well as diarrhea, blood in stool onset this am   83 years  old Female     PROCEDURE: CT CHEST W CONTRAST    TECHNIQUE:  Images were acquired with 1.42mm collimation reconstructed at 1.35mm and 5.13mm interval from the lung apices through the diaphragm with IOHEXOL 350 MG/ML IV SOLN/70 mL intravenous contrast . Coronal reconstructions were obtained. Automated dose lowering   techniques and/or adjustment according to patient size were utilized for this exam.    COMPARISON: None, x-ray from earlier same day    The CT confirms the presence of a sizable right effusion. On the CT, the effusion is layering posteriorly to the apex, indicating a free-flowing effusion  There is underlying atelectasis within the right middle lobe and right lower lobe. There is no evidence of bronchial obstruction in any of the atelectatic segments. Atelectasis is also seen in the lingula, which appears chronic.  The remaining lung parenchyma is unremarkable. No focal lesions or masses.    The mediastinal windows show enlargement of the pulmonary artery.  There is massive enlargement of the right atrium and right ventricle, along with reflux of contrast in the IVC and suprahepatic veins, indicating chronic right heart dysfunction.  The left atrium is also prominently enlarged.    The aorta is atheromatous, but not dilated.    There are no gross bony abnormalities .    Images of the upper abdomen show no gross abnormality.      Impression:       IMPRESSION:   Contrast CT of the chest showing a large right effusion, which is free-flowing, without evidence of underlying pleural enhancement or nodularity to suggest an exudate by CT.  Areas of subsegmental  atelectasis as described above, without evidence of endobronchial lesion or mass.    Very significant right atrium and right ventricular enlargement with signs of right heart failure.  Associated left atrial enhancement could indicate a combined right and left involvement.    ReadingStation:[id number]    XR Chest AP Portable [[id number]] Collected:  02/20/17 1019    Order Status:  Completed Updated:  02/20/17 1021    Narrative:       CLINICAL HISTORY:  Reason For Exam: Shortness of Breath  Patient states she has abdominal pain with diarrhea and blood in her stool. History of hypertension and atrial fibrillation.   83 years  old Female     PROCEDURE: XR CHEST AP PORTABLE    COMPARISON: None      Impression:       FINDINGS/IMPRESSION:  Prominent consolidation of the right middle and right lower lobe, with underlying effusion, possibly partially loculated within the major fissure..    If a CT of the contrast is contemplated, it must be performed with IV contrast to characterize the consolidation and pleural process     ReadingStation:[email or web address]           Assessment / Plan:        Hematochezia   She likely has mild acute colitis, at this point I would  treat empirically with 5 days of ciprofloxacin and Flagyl,  will check CBC daily, she is not having significant bleeding, at this point I do not believe she needs another colonoscopy unless she develops further bleeding. For now I will hold eliquis, this could be restarted on discharge if No further bleeding    Active Problems:    PAF (paroxysmal atrial fibrillation)  Will continue Cardizem, hold eliquis for now, if eliquis needs to be held for over 48 hours then I will recommend to switch for aspirin for CVA prophylaxis until the bleeding has resolved completely      HTN (hypertension)  Continue home medications, add. Hydralazine      Hypokalemia  She has received 40 mEq of potassium, will recheck labs in the morning      Pleural effusion, right  This could be just parapneumonic  effusion, there was no mass seen on the CT. The patient mentioned that she's had small lung collapse in that area before, the plan will be to do a thoracentesis and send the fluid for analysis. I think the patient to have a thoracentesis tomorrow given that the last dose of eliquis was yesterday 02/19/2017 at nighttime  Get echo as ct showed right sided en;largement      1.     Signed by: Roanna Epley, MD -   cc:Pcp, Kathreen Cosier, MD

## 2017-02-21 ENCOUNTER — Inpatient Hospital Stay: Payer: Medicare Other

## 2017-02-21 LAB — VH LDH BODY FLUID: Body Fluid LDH: 499 U/L

## 2017-02-21 LAB — CBC AND DIFFERENTIAL
Basophils %: 1.2 % (ref 0.0–3.0)
Basophils Absolute: 0.1 10*3/uL (ref 0.0–0.3)
Eosinophils %: 1.1 % (ref 0.0–7.0)
Eosinophils Absolute: 0.1 10*3/uL (ref 0.0–0.8)
Hematocrit: 41.8 % (ref 36.0–48.0)
Hemoglobin: 14.3 gm/dL (ref 12.0–16.0)
Lymphocytes Absolute: 2.2 10*3/uL (ref 0.6–5.1)
Lymphocytes: 36.1 % (ref 15.0–46.0)
MCH: 36 pg — ABNORMAL HIGH (ref 28–35)
MCHC: 34 gm/dL (ref 32–36)
MCV: 105 fL — ABNORMAL HIGH (ref 80–100)
MPV: 8.7 fL (ref 6.0–10.0)
Monocytes Absolute: 0.5 10*3/uL (ref 0.1–1.7)
Monocytes: 8.5 % (ref 3.0–15.0)
Neutrophils %: 53.2 % (ref 42.0–78.0)
Neutrophils Absolute: 3.3 10*3/uL (ref 1.7–8.6)
PLT CT: 178 10*3/uL (ref 130–440)
RBC: 3.98 10*6/uL (ref 3.80–5.00)
RDW: 13.9 % (ref 11.0–14.0)
WBC: 6.2 10*3/uL (ref 4.0–11.0)

## 2017-02-21 LAB — BASIC METABOLIC PANEL
Anion Gap: 11.3 mMol/L (ref 7.0–18.0)
BUN / Creatinine Ratio: 18.5 Ratio (ref 10.0–30.0)
BUN: 12 mg/dL (ref 7–22)
CO2: 26.7 mMol/L (ref 20.0–30.0)
Calcium: 9 mg/dL (ref 8.5–10.5)
Chloride: 102 mMol/L (ref 98–110)
Creatinine: 0.65 mg/dL (ref 0.60–1.20)
EGFR: 81 mL/min/{1.73_m2} (ref 60–150)
Glucose: 91 mg/dL (ref 71–99)
Osmolality Calc: 273 mOsm/kg — ABNORMAL LOW (ref 275–300)
Potassium: 3 mMol/L — CL (ref 3.5–5.3)
Sodium: 137 mMol/L (ref 136–147)

## 2017-02-21 LAB — LACTATE DEHYDROGENASE: LDH: 446 U/L — ABNORMAL HIGH (ref 94–250)

## 2017-02-21 LAB — VH PROTEIN BODY FLUID: Body Fluid Protein: 3.7 gm/dL

## 2017-02-21 LAB — MAGNESIUM: Magnesium: 1.6 mg/dL (ref 1.6–2.6)

## 2017-02-21 LAB — PROTEIN, TOTAL: Protein, Total: 6.3 gm/dL (ref 6.0–8.3)

## 2017-02-21 MED ORDER — LIDOCAINE HCL (PF) 1.5 % IJ SOLN
INTRAMUSCULAR | Status: AC
Start: 2017-02-21 — End: ?
  Filled 2017-02-21: qty 10

## 2017-02-21 MED ORDER — POTASSIUM CHLORIDE ER 10 MEQ PO TBCR
20.00 meq | EXTENDED_RELEASE_TABLET | Freq: Every day | ORAL | 0 refills | Status: DC
Start: 2017-02-21 — End: 2017-03-04

## 2017-02-21 MED ORDER — AMOXICILLIN-POT CLAVULANATE 875-125 MG PO TABS
1.00 | ORAL_TABLET | Freq: Two times a day (BID) | ORAL | 0 refills | Status: DC
Start: 2017-02-21 — End: 2017-03-04

## 2017-02-21 MED ORDER — VH BIO-K PLUS PROBIOTIC 50 BIL CFU CAPSULE
50.00 | DELAYED_RELEASE_CAPSULE | Freq: Every day | ORAL | 0 refills | Status: DC
Start: 2017-02-22 — End: 2017-03-25

## 2017-02-21 MED ORDER — VH POTASSIUM CHLORIDE CRYS ER 20 MEQ PO TBCR (WRAP)
40.00 meq | EXTENDED_RELEASE_TABLET | Freq: Two times a day (BID) | ORAL | Status: AC
Start: 2017-02-21 — End: 2017-02-21
  Administered 2017-02-21 (×2): 40 meq via ORAL
  Filled 2017-02-21 (×3): qty 2

## 2017-02-21 MED ORDER — FUROSEMIDE 20 MG PO TABS
20.00 mg | ORAL_TABLET | Freq: Every day | ORAL | 0 refills | Status: DC
Start: 2017-02-21 — End: 2017-03-04

## 2017-02-21 MED ORDER — LIDOCAINE HCL (PF) 1.5 % IJ SOLN
10.00 mL | Freq: Once | INTRAMUSCULAR | Status: AC
Start: 2017-02-21 — End: 2017-02-21
  Administered 2017-02-21: 12:00:00 10 mL via INTRADERMAL

## 2017-02-21 NOTE — Sedation Documentation (Signed)
Patient tolerated procedure well.

## 2017-02-21 NOTE — Progress Notes (Signed)
[facility]   [address]   [place] [address]     INITIAL ASSESSMENT  Case Management       Estimated D/C Date:     12/8 or 12/9   RX Coverage:       yes   Inpatient Plan of Care:      Hematochezia- per notes, likely mild acute colitis- empiric 5 days cipro and flagyl. Holding eliquis. Possible GI consult if significant bleeding. Moderate R sided pleural effusion- s/p thoracentesis- plan outpatient cardio follow up.    CM Interventions:      CM met with patient and her daughter at bedside. Patient is independent at baseline, just moved to the area from [place] about two months ago. No PCP in this area- CM requested enrollment specialist from transition clinic see patient/daughter to schedule f/u appt for [place]. Patient/daughter deny further [place] needs from CM this date. Plan [place] home with daughter support when medically ready.           02/21/17 1603   Patient Type   Within 30 Days of Previous Admission? No   Healthcare Decisions   Interviewed: Patient;Family  (Daughter)   Orientation/Decision Making Abilities of Patient Alert and Oriented x3, able to make decisions   Prior to admission   Prior level of function Independent with ADLs   Type of Residence Private residence   Home Layout Able to live on main level with bedroom/bathroom   Living Arrangements Children  (Lives with daughter)   How do you get to your MD appointments? (daughter)   How do you get your groceries? (patient shops with daughter)   Who fixes your meals? (shares with daughter)   Who does your laundry? (shares with daughter)   Who picks up your prescriptions? (daughter)   Dressing Independent   Grooming Independent   Feeding Independent   Hospital doctor Independent   Discharge Planning   Support Systems Spouse/significant other   Patient expects to be discharged to: home   Anticipated [place] plan discussed with: Same as interviewed   Mode of transportation: Private car (family member)   Consults/Providers   Correct PCP  listed in Epic? Yes  (No PCP)     Suzan Slick. Dorthy Cooler BSN, RN  Geophysicist/field seismologist. [phone]

## 2017-02-21 NOTE — Plan of Care (Signed)
Problem: Inadequate Gas Exchange  Goal: Adequate oxygenation and improved ventilation   02/21/17 1804   Goal/Interventions addressed this shift   Adequate oxygenation and improved ventilation Plan activities to conserve energy: plan rest periods;Position for maximum ventilatory efficiency;Provide mechanical and oxygen support to facilitate gas exchange;Monitor SpO2 and treat as needed       Comments: Patient denied needs, self caring and ambulant. Had CT abd thoracentesis and echo done today. Patient requested to go home. casse manager set up pt with transition clinic and patient discharged by MD. AVS signed and witnessed, daughter in attendance. Chair ordered.

## 2017-02-21 NOTE — Sedation Documentation (Signed)
Patient is resting comfortably. 

## 2017-02-21 NOTE — Sedation Documentation (Signed)
Vital signs stable. 

## 2017-02-21 NOTE — Sedation Documentation (Signed)
Patient experienced no respiratory distress during procedure.

## 2017-02-21 NOTE — Plan of Care (Addendum)
NURSE NOTE SUMMARY     Patient Name: Kristina [name]   Attending Physician: Jamison Oka *   Primary Care Physician: Patsy Lager, MD   Date of Admission:   02/20/2017   Today's date:   02/21/2017 LOS: 1 days   Shift Summary:                                                              Assumed care of pt at 2300. Rested throughout the night. Resp unlabored. VSS. Tele - Afib. H&H WNL. No bleeding noted. Eliquis is on hold. No c/o abd pain. Receiving PO Cipro and PO Flagyl empirically. No distress.   Provider Notifications:   N/A   Rapid Response Notifications:  Mobility:   N/A     PMP Activity: Step 6 - Walks in Room (02/21/2017  3:00 AM)     Weight tracking:  Family Dynamic:     Last 3 Weights for the past 72 hrs (Last 3 readings):   Weight   02/20/17 1957 50.6 kg (111 lb 8 oz)   02/20/17 0829 50.8 kg (112 lb)       Recent Vitals:  Active Problems:     BP 115/75   Pulse 74   Temp 97.3 F (36.3 C) (Oral)   Resp 18   Ht 1.6 m (5\' 3" )   Wt 50.6 kg (111 lb 8 oz)   SpO2 93%   BMI 19.75 kg/m          Principal Problem:    Hematochezia  Active Problems:    PAF (paroxysmal atrial fibrillation)    HTN (hypertension)    Hypokalemia    Pleural effusion, right       Problem: Altered GI Function  Goal: No bleeding  Outcome: Progressing   02/21/17 0625   Goal/Interventions addressed this shift   No bleeding  Monitor and assess vitals and hemodynamic parameters;Monitor/assess lab values and report abnormal values

## 2017-02-21 NOTE — Progress Notes (Signed)
MEDICINE PROGRESS NOTE    Date Time: 02/21/17 3:27 PM  Patient Name: Kristina [name]  Attending Physician: Theda Sers, MD      Subjective     CC: Hematochezia    Kristina [name] is an 83 y.o. female who presents to the hospital with  Bloody stools and shortness of breath.    The patient has history of paroxysmal atrial fibrillation, hypertension, hypothyroidism. Taking Eliquis 2.5mg  po Q12H. She was treated for pneumonia approximately 4 weeks ago,  Left with  mild shortness of breath.    On 02/20/17 at 3am  she started to have cramping abdominal pain, at 6 AM she had soft bowel movement with a small amount of blood at the end, then she had 2 more episodes of liquid stools with bright red blood mixed with them. For such reason she was brought to ER.     IN ER, the patient did not have leukocytosis. Chest x-ray and a CT scan showing moderate right pleural effusion no mass or obstruction. Patient was given 1 dose of IV Lasix 40. Patient was short likely to have acute colitis, Cipro and Flagyl empirically started. The Eliquis was on held. Patient was admitted.    Patient had no more bowel movements overnight. The abdominal pain has subsided. No chills or fever.  Patient still has some short of breath especially with ambulation. No cough no chest pain.  Sometimes when patient feels palpitation.           Review of Systems - Negative except above     Physical Exam:     Vitals:    02/21/17 1317   BP: 124/87   Pulse: 80   Resp: 18   Temp: 97.7 F (36.5 C)   SpO2: (!) 87%       Temperature: Temp  Min: 97.3 F (36.3 C)  Max: 98.1 F (36.7 C)  Pulse: Pulse  Min: 28  Max: 103  Respiratory: Resp  Min: 15  Max: 20  Non-Invasive BP: BP  Min: 115/75  Max: 154/97  Pulse Oximetry SpO2  Min: 87 %  Max: 98 %    Intake and Output Summary (Last 24 hours) at Date Time    Intake/Output Summary (Last 24 hours) at 02/21/17 1527  Last data filed at 02/21/17 1501   Gross per 24 hour   Intake             1130 ml   Output             1750  ml   Net             -620 ml       General appearance - alert, well appearing, and in no distress    Eyes - pupils equal and reactive, extraocular eye movements intact    Mouth - mucous membranes moist, pharynx normal without lesions    Chest - clear to auscultation in the left side. Right lung with decreased breath sound. No increased work of breathing    Heart - irregular rate and reason.  normal S1, S2, no murmurs, rubs, clicks or gallops    Abdomen - soft, nontender, nondistended, no masses or organomegaly    Neurological - normal speech, no focal findings or movement disorder noted    Musculoskeletal - no joint tenderness, deformity or swelling    Skin - normal coloration and turgor, no rashes, no suspicious skin lesions noted      Meds:     Current  Facility-Administered Medications   Medication Dose Route Frequency   . ciprofloxacin  500 mg Oral Q12H SCH   . dilTIAZem  240 mg Oral QAM   . fluticasone  1 spray Each Nare Daily   . folic acid  1 mg Oral Daily   . lactobacillus species  50 Billion CFU Oral Daily   . latanoprost  1 drop Both Eyes QPM   . levothyroxine  112 mcg Oral QAM   . metoprolol tartrate  25 mg Oral BID   . metroNIDAZOLE  500 mg Oral Q8H SCH   . NON-FORMULARY order form  3,000 Units Oral TID MEALS   . potassium chloride  40 mEq Oral BID   . sodium chloride (PF)  3 mL Intravenous Q8H        Labs:     Recent Labs  Lab 02/21/17  0551 02/20/17  0850   WBC 6.2 7.2   RBC 3.98 4.04   Hemoglobin 14.3 14.3   Hematocrit 41.8 42.5   MCV 105* 105*   PLT CT 178 188         Recent Labs  Lab 02/21/17  0551 02/20/17  0850   Sodium 137 135*   Potassium 3.0* 3.1*   Chloride 102 99   CO2 26.7 26.0   BUN 12 15   Creatinine 0.65 0.72   Glucose 91 106*   Calcium 9.0 9.9   Magnesium 1.6  --          Recent Labs  Lab 02/20/17  0850   ALT 16   AST (SGOT) 32   Bilirubin, Total 1.4*   Bilirubin, Direct 0.6*   Albumin 4.4   Alkaline Phosphatase 103               Recent Labs  Lab 02/20/17  0850   PT INR 1.1   PT 11.5              Imaging, reviewed and are significant for:      XR Chest PA Or AP   Final Result   No pneumothorax status post right-sided thoracentesis.      ReadingStation:[email or web address]      Echocardiogram Adult Complete W Clr/ Dopp Waveform   Final Result      US Thoracentesis   Final Result   Successful ultrasound guided drainage catheter insertion and subsequent large volume thoracentesis.      ReadingStation:[email or web address]      CT Chest W Contrast (Trauma)   Final Result   IMPRESSION:    Contrast CT of the chest showing a large right effusion, which is free-flowing, without evidence of underlying pleural enhancement or nodularity to suggest an exudate by CT.   Areas of subsegmental atelectasis as described above, without evidence of endobronchial lesion or mass.      Very significant right atrium and right ventricular enlargement with signs of right heart failure.   Associated left atrial enhancement could indicate a combined right and left involvement.      ReadingStation:[email or web address]      XR Chest AP Portable   Final Result   FINDINGS/IMPRESSION:   Prominent consolidation of the right middle and right lower lobe, with underlying effusion, possibly partially loculated within the major fissure..      If a CT of the contrast is contemplated, it must be performed with IV contrast to characterize the consolidation and pleural process       ReadingStation:[email or web address]      CT Abdomen Pelvis WO IV/ W PO  Cont    (Results Pending)        Microbiology, reviewed and are significant for:  Microbiology Results     Procedure Component Value Units Date/Time    Pleural Fluid Culture and Smear [[id number]] Collected:  02/21/17 1130    Specimen:  Pleural from Pleural Fluid Updated:  02/21/17 1437             Assessment / Plan:     Active Hospital Problems    Diagnosis   . Hematochezia   . PAF (paroxysmal atrial fibrillation)   . HTN (hypertension)   . Hypokalemia   . Pleural effusion, right         ICD-10-CM    1. Acute GI bleeding K92.2    2. Pleural  effusion, right J90 XR Chest PA Or AP     XR Chest PA Or AP       Hematochezia,   She likely has mild acute colitis, will  Empirically treat  with 5 days of ciprofloxacin and Flagyl,    hold eliquis, this could be restarted on discharge if No further bleeding  CT of abdomen  Monitor CBC  GI consult as needed  If significant bleeding    Paroxysmal atrial fibrillation, still A. Fib, rate controlled  continue Cardizem, hold eliquis     HTN, controlled    Hypokalemia   Replace and recheck      Pleural effusion, right side, moderate.   Could be parapneumonic effusion, there was no mass seen on the CT  S/p  right-sided area thoracentesis with 1.2 L of clear fluid drained.  Patient does have a severe tricuspid regurgitation, this may contribute to the pleural effusion  Can follow-up with the outside cardiologist.      Systolic heart failure with both left-sided heart failure and then right-sided heart failure, ejection fraction 35-40% as per echocardiogram  Patient benefits from cardiology follow-up  Patient may benefit from diuresis treatment  Patient is on beta blocker, cannot take Ace because patient has allergy to lisinopril  Recheck lipid panel in a.m.  Consider starting treatment if indicated    Hypothyroidism, continue levothyroxine                      Signed by: Theda Sers, MD Can be reached at [phone]

## 2017-02-21 NOTE — Plan of Care (Signed)
Report called to Maryruth Hancock the primary nurse for this patient.  Made aware that patient is in CV lab for her ECHO and remain here until test is finished.  Chest x-ray complete before patient transferred to CV lab.

## 2017-02-21 NOTE — UM Notes (Signed)
Admission Review  History   83 y.o. female who presents to the hospital with  Bloody stools, also shortness of breath.  VS  156/110, 51, 97.8, 16, 95%  Labs  bnp 761.9, T. Bili 1.4, NA 135, K 3.1,   Imagining   CT chest  IMPRESSION:   Contrast CT of the chest showing a large right effusion, which is free-flowing, without evidence of underlying pleural enhancement or nodularity to suggest an exudate by CT.  Areas of subsegmental atelectasis as described above, without evidence of endobronchial lesion or mass.    Very significant right atrium and right ventricular enlargement with signs of right heart failure.  Associated left atrial enhancement could indicate a combined right and left involvement.  Assessment/Plan  Hematochezia   She likely has mild acute colitis, at this point I would treat empirically with 5 days of ciprofloxacin and Flagyl,  will check CBC daily, she is not having significant bleeding, at this point I do not believe she needs another colonoscopy unless she develops further bleeding. For now I will hold eliquis, this could be restarted on discharge if No further bleeding  PAF (paroxysmal atrial fibrillation)  Will continue Cardizem, hold eliquis for now, if eliquis needs to be held for over 48 hours then I will recommend to switch for aspirin for CVA prophylaxis until the bleeding has resolved completely  Pleural effusion, right  This could be just parapneumonic effusion, there was no mass seen on the CT. The patient mentioned that she's had small lung collapse in that area before, the plan will be to do a thoracentesis and send the fluid for analysis. I think the patient to have a thoracentesis tomorrow given that the last dose of eliquis was yesterday 02/19/2017 at nighttime  Get echo as ct showed right sided en;largement  Admission Orders  Echo, tele, [place] thorocentesis

## 2017-02-21 NOTE — Sedation Documentation (Signed)
Debriefing complete- specimens labeled properly and tech will deliver to lab, team discussed ordered labs.There are no equipment concerns.  No critical events or postoperative concerns. No discrepancies identified.

## 2017-02-21 NOTE — UM Notes (Deleted)
[facility] - Facility # [id number]  EHR eReferral Notification Requirements    To be sent by secure email to [email or web address] or   by fax to [phone] or [phone]        FIELDS ARE REQUIRED TO BE COMPLETED     Patient Name Kristina [name]  Account Number/CSN [id number]  Date of Birth 11/13/1929  Date of Admission and Time:   02/20/2017  Start of Initial Service Date and Time:       Date of Previous Admission (Needed For Readmission Reviews Only):   _______________________________  Discharge Date and Time (if applicable):   ___________________________________     PLEASE CHECK OFF TYPE OF REVIEW & CURRENT ADMISSION STATUS FROM LISTS BELOW    Type of Review:  _x__ Admission Review  ___ Readmission Review  ___ Observation Follow-Up Review  . Dates to be Reviewed:    ___ Procedure Review  ___ Cardiac Procedure Review  ___ IP Continued Stay Review  Dates to be Reviewed:______________  Current Admission Order date ______, time ____ , & location of documentation ______________________ xInpatient   ____ Observation / Outpatient Services   ____ Outpatient Procedure / Cardiology / Vascular       ____ Other    Case Manager/ Contact Number for EHR outcome/recommendation call:   Antoine Poche [phone]   Attending Physician/ Contact Number (if not the same as in electronic record): ___please do not call physician_____________________________________________________________________________________  Comments:  .   _ __Is this patient appropriate for inpatient? _________________________________________________________________    Additional Information being Emailed or Faxed:   Yes ______                 No__x____      Voncille Lo Supporting Documents to EHR at [phone] or [phone]

## 2017-02-21 NOTE — Sedation Documentation (Signed)
Patient denies pain and is resting comfortably.  

## 2017-02-21 NOTE — Discharge Summary (Signed)
DISCHARGE SUMMARY    Kristina [name], 83 y.o., DOB 04/05/1929, MRN [id number].  Admission date: 02/20/2017  Discharge Date 02/21/2017      Admission Diagnosis  Acute GI bleeding [K92.2]  Hematochezia [K92.1]    Discharge Diagnosis   Principal Problem:    Hematochezia  Active Problems:    PAF (paroxysmal atrial fibrillation)    HTN (hypertension)    Hypokalemia    Pleural effusion, right      Chronic systolic CHF  Right sided heart  Failure  Severe tricuspid regurgitation     Hospital Course :  For full details of the patient's admission please consult the whole medical record including daily progress notes, history and physical, consult notes, lab reports as well as imaging studies.  Briefly Kristina Maxfield Mebaneis an 83 y.o.femalewho presents to the hospital with Bloody stools and shortness of breath.    The patient has history of paroxysmal atrial fibrillation, hypertension, hypothyroidism. Taking Eliquis 2.5mg  po Q12H. She was treated for pneumonia approximately 4 weeks ago,  Left with  mild shortness of breath.    On 02/20/17 at 3amshe started to have cramping abdominal pain, at 6 AM she had soft bowel movement with a small amount of blood at the end, then she had 2 more episodes of liquid stools with bright red blood mixed with them. For such reason she was brought to ER.     IN ER, the patient did not have leukocytosis. Chest x-ray and a CT scan showing moderate right pleural effusion no mass or obstruction. Patient was given 1 dose of IV Lasix 40. Patient was short likely to have acute colitis, Cipro and Flagyl empirically started. The Eliquis was on held. Patient was admitted.    Patient had no more bloody bowel movements overnight. The abdominal pain has subsided. No chills or fever.  Patient still has some short of breath especially with ambulation. No cough no chest pain. The patient had CT of abdomen shows ascending colon colitis.     The patient did have right sided pleural effusion, had  thoracentesis with 1.2L clear yellow fluid drained. The patient did have ECHO showing both LV failure EF 40% and RV failure. The patient also has severe tricuspid regurgitation.     The patient was stable for discharge. Cipro and Flagyl changed to Augmentin at time of discharge.     The patient was given Lasix 20 mg po daily and Potassium suplement.    The patient  Was advised to resume Eliquis on 02/24/17. The patient was also advised to return to ER for worsening GI symptoms/Gi bleeding.     The [facility], BMP / K recheck advised.       Physical Exam at the time of discharge:     General: AAO x 3, NAD  CVS: irregular, S1, S2 normal  Resp: CTA B/L, no wheezing  Abd: soft, Non-tender, non-distended, audible bowel sounds  Neuro: CN II-XII intact, no gross focal deficit      CT Abdomen Pelvis WO IV/ W PO Cont   Final Result   1.  There is mild wall thickening along the ascending colon. This is suggestive of colitis. Findings could be infectious, inflammatory or ischemic in etiology. Consider further evaluation with endoscopy following the resolution of acute illness to    exclude neoplastic wall thickening.   2.  Heterogeneous right basilar airspace disease is present and there is a small amount of residual right pleural  fluid. In the setting of recent thoracentesis, airspace disease is suspected to represent resolving atelectasis. Superimposed consolidation    cannot be excluded.   3.  Advanced atherosclerosis.   4.  Fibroid uterus.      ReadingStation:[email or web address]      XR Chest PA Or AP   Final Result   No pneumothorax status post right-sided thoracentesis.      ReadingStation:[email or web address]      Echocardiogram Adult Complete W Clr/ Dopp Waveform   Final Result      US Thoracentesis   Final Result   Successful ultrasound guided drainage catheter insertion and subsequent large volume thoracentesis.      ReadingStation:[email or web address]      CT Chest W Contrast (Trauma)   Final Result    IMPRESSION:    Contrast CT of the chest showing a large right effusion, which is free-flowing, without evidence of underlying pleural enhancement or nodularity to suggest an exudate by CT.   Areas of subsegmental atelectasis as described above, without evidence of endobronchial lesion or mass.      Very significant right atrium and right ventricular enlargement with signs of right heart failure.   Associated left atrial enhancement could indicate a combined right and left involvement.      ReadingStation:[email or web address]      XR Chest AP Portable   Final Result   FINDINGS/IMPRESSION:   Prominent consolidation of the right middle and right lower lobe, with underlying effusion, possibly partially loculated within the major fissure..      If a CT of the contrast is contemplated, it must be performed with IV contrast to characterize the consolidation and pleural process       ReadingStation:[email or web address]          Discharge Instructions       Diet: heart healthy, soft diet  Activity: As tolerated  [facility]     Condition on dischage: Stable          Discharge Medications        Medication List      START taking these medications    amoxicillin-clavulanate 875-125 MG per tablet  Commonly known as:  AUGMENTIN  Take 1 tablet by mouth 2 (two) times daily.for 6 days     furosemide 20 MG tablet  Commonly known as:  LASIX  Take 1 tablet (20 mg total) by mouth daily.for 7 days     lactobacillus species capsule  Commonly known as:  BIO-K PLUS  Take 1 capsule (50 Billion CFU total) by mouth daily.  Start taking on:  02/22/2017     potassium chloride 10 MEQ tablet  Commonly known as:  K-TAB, KLOR-CON  Take 2 tablets (20 mEq total) by mouth daily.for 7 days        CONTINUE taking these medications    dilTIAZem 240 MG 24 hr capsule  Commonly known as:  CARDIZEM CD     fluticasone 50 MCG/ACT nasal spray  Commonly known as:  FLONASE     folic acid 1 MG tablet  Commonly known as:  FOLVITE     lactase 3000 units  tablet  Commonly known as:  LACTAID     latanoprost 0.005 % ophthalmic solution  Commonly known as:  XALATAN     levothyroxine 112 MCG tablet  Commonly known as:  SYNTHROID, LEVOTHROID     LUMIGAN 0.01 % Soln  Generic drug:  bimatoprost     methotrexate 2.5 MG tablet     metoprolol  tartrate 25 MG tablet  Commonly known as:  LOPRESSOR     nitroglycerin 0.4 MG SL tablet  Commonly known as:  NITROSTAT     PROAIR HFA 108 (90 Base) MCG/ACT inhaler  Generic drug:  albuterol     SIMBRINZA 1-0.2 % Susp  Generic drug:  Brinzolamide-Brimonidine        STOP taking these medications    ELIQUIS 2.5 MG  Generic drug:  apixaban     hydroCHLOROthiazide 12.5 MG capsule  Commonly known as:  MICROZIDE           Where to Get Your Medications      These medications were sent to [facility] - [place], [place] - [address]  [address], [place] [address]    Phone:  [phone]    amoxicillin-clavulanate 875-125 MG per tablet   furosemide 20 MG tablet   lactobacillus species capsule   potassium chloride 10 MEQ tablet           Consults    Treatment Team:   Attending Provider: Theda Sers, MD     Total Time in preparing paper work, data evaluation and todays exam - 35 minutes    Signed:  Theda Sers  02/21/2017  5:50 PM

## 2017-02-24 ENCOUNTER — Emergency Department: Payer: Medicare Other

## 2017-02-24 ENCOUNTER — Inpatient Hospital Stay
Admission: EM | Admit: 2017-02-24 | Discharge: 2017-03-04 | DRG: 064 | Disposition: A | Discharge status: Skilled Nursing Facility | Payer: Medicare Other | Attending: Internal Medicine | Admitting: Internal Medicine

## 2017-02-24 DIAGNOSIS — I110 Hypertensive heart disease with heart failure: Secondary | ICD-10-CM | POA: Diagnosis present

## 2017-02-24 DIAGNOSIS — J90 Pleural effusion, not elsewhere classified: Secondary | ICD-10-CM | POA: Diagnosis not present

## 2017-02-24 DIAGNOSIS — R471 Dysarthria and anarthria: Secondary | ICD-10-CM | POA: Diagnosis present

## 2017-02-24 DIAGNOSIS — R2689 Other abnormalities of gait and mobility: Secondary | ICD-10-CM | POA: Diagnosis not present

## 2017-02-24 DIAGNOSIS — G936 Cerebral edema: Secondary | ICD-10-CM | POA: Diagnosis not present

## 2017-02-24 DIAGNOSIS — M810 Age-related osteoporosis without current pathological fracture: Secondary | ICD-10-CM | POA: Diagnosis not present

## 2017-02-24 DIAGNOSIS — M069 Rheumatoid arthritis, unspecified: Secondary | ICD-10-CM | POA: Diagnosis present

## 2017-02-24 DIAGNOSIS — M1711 Unilateral primary osteoarthritis, right knee: Secondary | ICD-10-CM | POA: Diagnosis not present

## 2017-02-24 DIAGNOSIS — I5022 Chronic systolic (congestive) heart failure: Secondary | ICD-10-CM | POA: Diagnosis not present

## 2017-02-24 DIAGNOSIS — R4701 Aphasia: Secondary | ICD-10-CM | POA: Diagnosis not present

## 2017-02-24 DIAGNOSIS — F419 Anxiety disorder, unspecified: Secondary | ICD-10-CM | POA: Diagnosis present

## 2017-02-24 DIAGNOSIS — I611 Nontraumatic intracerebral hemorrhage in hemisphere, cortical: Secondary | ICD-10-CM | POA: Diagnosis not present

## 2017-02-24 DIAGNOSIS — M25472 Effusion, left ankle: Secondary | ICD-10-CM | POA: Diagnosis not present

## 2017-02-24 DIAGNOSIS — I69020 Aphasia following nontraumatic subarachnoid hemorrhage: Secondary | ICD-10-CM | POA: Diagnosis not present

## 2017-02-24 DIAGNOSIS — M25461 Effusion, right knee: Secondary | ICD-10-CM | POA: Diagnosis not present

## 2017-02-24 DIAGNOSIS — R001 Bradycardia, unspecified: Secondary | ICD-10-CM | POA: Diagnosis not present

## 2017-02-24 DIAGNOSIS — R402362 Coma scale, best motor response, obeys commands, at arrival to emergency department: Secondary | ICD-10-CM | POA: Diagnosis present

## 2017-02-24 DIAGNOSIS — I509 Heart failure, unspecified: Secondary | ICD-10-CM | POA: Diagnosis not present

## 2017-02-24 DIAGNOSIS — J9811 Atelectasis: Secondary | ICD-10-CM | POA: Diagnosis not present

## 2017-02-24 DIAGNOSIS — E785 Hyperlipidemia, unspecified: Secondary | ICD-10-CM | POA: Diagnosis present

## 2017-02-24 DIAGNOSIS — R531 Weakness: Secondary | ICD-10-CM | POA: Diagnosis not present

## 2017-02-24 DIAGNOSIS — K529 Noninfective gastroenteritis and colitis, unspecified: Secondary | ICD-10-CM | POA: Diagnosis not present

## 2017-02-24 DIAGNOSIS — M6281 Muscle weakness (generalized): Secondary | ICD-10-CM | POA: Diagnosis not present

## 2017-02-24 DIAGNOSIS — I69320 Aphasia following cerebral infarction: Secondary | ICD-10-CM | POA: Diagnosis not present

## 2017-02-24 DIAGNOSIS — F800 Phonological disorder: Secondary | ICD-10-CM | POA: Diagnosis not present

## 2017-02-24 DIAGNOSIS — E876 Hypokalemia: Secondary | ICD-10-CM | POA: Diagnosis not present

## 2017-02-24 DIAGNOSIS — M25061 Hemarthrosis, right knee: Secondary | ICD-10-CM | POA: Diagnosis not present

## 2017-02-24 DIAGNOSIS — I480 Paroxysmal atrial fibrillation: Secondary | ICD-10-CM | POA: Diagnosis not present

## 2017-02-24 DIAGNOSIS — E860 Dehydration: Secondary | ICD-10-CM | POA: Diagnosis present

## 2017-02-24 DIAGNOSIS — R402142 Coma scale, eyes open, spontaneous, at arrival to emergency department: Secondary | ICD-10-CM | POA: Diagnosis present

## 2017-02-24 DIAGNOSIS — I4581 Long QT syndrome: Secondary | ICD-10-CM | POA: Diagnosis not present

## 2017-02-24 DIAGNOSIS — I10 Essential (primary) hypertension: Secondary | ICD-10-CM | POA: Diagnosis not present

## 2017-02-24 DIAGNOSIS — I639 Cerebral infarction, unspecified: Secondary | ICD-10-CM | POA: Diagnosis not present

## 2017-02-24 DIAGNOSIS — M7121 Synovial cyst of popliteal space [Baker], right knee: Secondary | ICD-10-CM | POA: Diagnosis not present

## 2017-02-24 DIAGNOSIS — M19072 Primary osteoarthritis, left ankle and foot: Secondary | ICD-10-CM | POA: Diagnosis not present

## 2017-02-24 DIAGNOSIS — R402252 Coma scale, best verbal response, oriented, at arrival to emergency department: Secondary | ICD-10-CM | POA: Diagnosis present

## 2017-02-24 DIAGNOSIS — I609 Nontraumatic subarachnoid hemorrhage, unspecified: Secondary | ICD-10-CM | POA: Diagnosis not present

## 2017-02-24 DIAGNOSIS — R937 Abnormal findings on diagnostic imaging of other parts of musculoskeletal system: Secondary | ICD-10-CM | POA: Diagnosis not present

## 2017-02-24 DIAGNOSIS — Z9181 History of falling: Secondary | ICD-10-CM | POA: Diagnosis not present

## 2017-02-24 DIAGNOSIS — M25561 Pain in right knee: Secondary | ICD-10-CM | POA: Diagnosis not present

## 2017-02-24 DIAGNOSIS — I071 Rheumatic tricuspid insufficiency: Secondary | ICD-10-CM | POA: Diagnosis present

## 2017-02-24 DIAGNOSIS — E039 Hypothyroidism, unspecified: Secondary | ICD-10-CM | POA: Diagnosis not present

## 2017-02-24 DIAGNOSIS — I608 Other nontraumatic subarachnoid hemorrhage: Secondary | ICD-10-CM | POA: Diagnosis not present

## 2017-02-24 DIAGNOSIS — Z888 Allergy status to other drugs, medicaments and biological substances status: Secondary | ICD-10-CM

## 2017-02-24 DIAGNOSIS — Z79890 Hormone replacement therapy (postmenopausal): Secondary | ICD-10-CM

## 2017-02-24 DIAGNOSIS — I619 Nontraumatic intracerebral hemorrhage, unspecified: Secondary | ICD-10-CM

## 2017-02-24 DIAGNOSIS — Z85820 Personal history of malignant melanoma of skin: Secondary | ICD-10-CM

## 2017-02-24 DIAGNOSIS — Z7901 Long term (current) use of anticoagulants: Secondary | ICD-10-CM

## 2017-02-24 LAB — CBC AND DIFFERENTIAL
Basophils %: 0.8 % (ref 0.0–3.0)
Basophils Absolute: 0.1 10*3/uL (ref 0.0–0.3)
Eosinophils %: 0.6 % (ref 0.0–7.0)
Eosinophils Absolute: 0.1 10*3/uL (ref 0.0–0.8)
Hematocrit: 47.6 % (ref 36.0–48.0)
Hemoglobin: 15.2 gm/dL (ref 12.0–16.0)
Lymphocytes Absolute: 2.5 10*3/uL (ref 0.6–5.1)
Lymphocytes: 27 % (ref 15.0–46.0)
MCH: 34 pg (ref 28–35)
MCHC: 32 gm/dL (ref 32–36)
MCV: 107 fL — ABNORMAL HIGH (ref 80–100)
MPV: 8 fL (ref 6.0–10.0)
Monocytes Absolute: 0.6 10*3/uL (ref 0.1–1.7)
Monocytes: 7 % (ref 3.0–15.0)
Neutrophils %: 64.6 % (ref 42.0–78.0)
Neutrophils Absolute: 5.9 10*3/uL (ref 1.7–8.6)
PLT CT: 181 10*3/uL (ref 130–440)
RBC: 4.46 10*6/uL (ref 3.80–5.00)
RDW: 14.1 % — ABNORMAL HIGH (ref 11.0–14.0)
WBC: 9.1 10*3/uL (ref 4.0–11.0)

## 2017-02-24 LAB — LIPASE: Lipase: 12 U/L (ref 8–78)

## 2017-02-24 LAB — COMPREHENSIVE METABOLIC PANEL
ALT: 16 U/L (ref 0–55)
AST (SGOT): 38 U/L (ref 10–42)
Albumin/Globulin Ratio: 1.46 Ratio (ref 0.70–1.50)
Albumin: 4.1 gm/dL (ref 3.5–5.0)
Alkaline Phosphatase: 81 U/L (ref 40–145)
Anion Gap: 17.4 mMol/L (ref 7.0–18.0)
BUN / Creatinine Ratio: 24.4 Ratio (ref 10.0–30.0)
BUN: 20 mg/dL (ref 7–22)
Bilirubin, Total: 1 mg/dL (ref 0.1–1.2)
CO2: 19.9 mMol/L — ABNORMAL LOW (ref 20.0–30.0)
Calcium: 9.8 mg/dL (ref 8.5–10.5)
Chloride: 106 mMol/L (ref 98–110)
Creatinine: 0.82 mg/dL (ref 0.60–1.20)
EGFR: 65 mL/min/{1.73_m2} (ref 60–150)
Globulin: 2.8 gm/dL (ref 2.0–4.0)
Glucose: 95 mg/dL (ref 71–99)
Osmolality Calc: 280 mOsm/kg (ref 275–300)
Potassium: 4.3 mMol/L (ref 3.5–5.3)
Protein, Total: 6.9 gm/dL (ref 6.0–8.3)
Sodium: 139 mMol/L (ref 136–147)

## 2017-02-24 LAB — VH CELL COUNT BODY FLUID
Body Fluid Lymphocytes: 43 %
Body Fluid Monocytes: 4 %
Body Fluid Neutrophil Count: 53 %
Body Fluid RBC: 2246 /mm3
Total NUCLEATED CELL COUNT: 1022 /mm3

## 2017-02-24 LAB — HEMOGLOBIN A1C: Hgb A1C, %: 5.7 %

## 2017-02-24 LAB — T4, FREE: T4 Free: 1.12 ng/dL (ref 0.70–1.48)

## 2017-02-24 LAB — VITAMIN B12 AND FOLATE
Folate: 16.8 ng/mL (ref 7.0–19.9)
Vitamin B-12: 493 pg/mL (ref 213–816)

## 2017-02-24 LAB — SEDIMENTATION RATE: Sed Rate: 11 mm/hr (ref 0–20)

## 2017-02-24 LAB — TSH: TSH: 5.94 u[IU]/mL — ABNORMAL HIGH (ref 0.40–4.20)

## 2017-02-24 LAB — C-REACTIVE PROTEIN: C-Reactive Protein: 0.72 mg/dL (ref 0.02–0.80)

## 2017-02-24 MED ORDER — ACETAMINOPHEN 650 MG RE SUPP
650.00 mg | RECTAL | Status: DC | PRN
Start: 2017-02-24 — End: 2017-03-04

## 2017-02-24 MED ORDER — SODIUM CHLORIDE 0.9 % IJ SOLN
0.40 mg | INTRAMUSCULAR | Status: DC | PRN
Start: 2017-02-24 — End: 2017-03-04

## 2017-02-24 MED ORDER — HYDRALAZINE HCL 20 MG/ML IJ SOLN
10.0000 mg | INTRAMUSCULAR | Status: DC | PRN
Start: 2017-02-24 — End: 2017-03-04

## 2017-02-24 MED ORDER — ATORVASTATIN CALCIUM 40 MG PO TABS
80.00 mg | ORAL_TABLET | Freq: Every evening | ORAL | Status: DC
Start: 2017-02-24 — End: 2017-03-04
  Administered 2017-02-25 – 2017-03-03 (×6): 80 mg via ORAL
  Filled 2017-02-24 (×9): qty 2

## 2017-02-24 MED ORDER — ACETAMINOPHEN 325 MG PO TABS
650.00 mg | ORAL_TABLET | ORAL | Status: DC | PRN
Start: 2017-02-24 — End: 2017-03-04
  Administered 2017-03-02 – 2017-03-03 (×3): 650 mg via ORAL
  Filled 2017-02-24 (×3): qty 2

## 2017-02-24 MED ORDER — LATANOPROST 0.005 % OP SOLN
1.00 [drp] | Freq: Every evening | OPHTHALMIC | Status: DC
Start: 2017-02-24 — End: 2017-03-04
  Administered 2017-02-27 – 2017-03-03 (×4): 1 [drp] via OPHTHALMIC
  Filled 2017-02-24 (×2): qty 1

## 2017-02-24 MED ORDER — METRONIDAZOLE IN NACL 500 MG/100 ML IV SOLN (WRAP)
500.00 mg | Freq: Three times a day (TID) | INTRAVENOUS | Status: DC
Start: 2017-02-24 — End: 2017-02-25
  Administered 2017-02-24 – 2017-02-25 (×2): 500 mg via INTRAVENOUS
  Filled 2017-02-24 (×2): qty 100

## 2017-02-24 MED ORDER — FLUTICASONE PROPIONATE 50 MCG/ACT NA SUSP
1.00 | Freq: Every day | NASAL | Status: DC
Start: 2017-02-25 — End: 2017-03-04
  Administered 2017-02-26 – 2017-03-04 (×7): 1 via NASAL
  Filled 2017-02-24 (×2): qty 16

## 2017-02-24 MED ORDER — CIPROFLOXACIN IN D5W 400 MG/200ML IV SOLN
400.00 mg | Freq: Two times a day (BID) | INTRAVENOUS | Status: DC
Start: 2017-02-24 — End: 2017-02-25
  Administered 2017-02-24 – 2017-02-25 (×2): 400 mg via INTRAVENOUS
  Filled 2017-02-24 (×2): qty 200

## 2017-02-24 MED ORDER — ACETAMINOPHEN 160 MG/5ML PO SOLN
650.00 mg | ORAL | Status: DC | PRN
Start: 2017-02-24 — End: 2017-03-04

## 2017-02-24 MED ORDER — LEVOTHYROXINE SODIUM 112 MCG PO TABS
112.00 ug | ORAL_TABLET | Freq: Every morning | ORAL | Status: DC
Start: 2017-02-25 — End: 2017-03-04
  Administered 2017-02-25 – 2017-03-04 (×8): 112 ug via ORAL
  Filled 2017-02-24 (×9): qty 1

## 2017-02-24 MED ORDER — GADOTERATE MEGLUMINE 7.5 MMOL/15ML IV SOLN
10.00 mL | Freq: Once | INTRAVENOUS | Status: AC | PRN
Start: 2017-02-24 — End: 2017-02-24
  Administered 2017-02-24: 14:00:00 5 mmol via INTRAVENOUS

## 2017-02-24 MED ORDER — SODIUM CHLORIDE 0.9 % IV SOLN
INTRAVENOUS | Status: DC
Start: 2017-02-24 — End: 2017-02-25

## 2017-02-24 MED ORDER — METOPROLOL TARTRATE 5 MG/5ML IV SOLN
5.00 mg | INTRAVENOUS | Status: DC | PRN
Start: 2017-02-24 — End: 2017-03-04
  Administered 2017-02-25: 19:00:00 5 mg via INTRAVENOUS
  Filled 2017-02-24: qty 5

## 2017-02-24 MED ORDER — SODIUM CHLORIDE 0.9 % IJ SOLN
3.00 mL | Freq: Three times a day (TID) | INTRAMUSCULAR | Status: DC
Start: 2017-02-24 — End: 2017-03-04
  Administered 2017-02-27 – 2017-03-03 (×10): 3 mL via INTRAVENOUS

## 2017-02-24 MED ORDER — SODIUM CHLORIDE 0.9 % IV BOLUS
500.00 mL | Freq: Once | INTRAVENOUS | Status: AC
Start: 2017-02-24 — End: 2017-02-24
  Administered 2017-02-24: 12:00:00 500 mL via INTRAVENOUS

## 2017-02-24 NOTE — ED Notes (Signed)
Meal tray re-requested.

## 2017-02-24 NOTE — H&P (Signed)
ADMISSION HISTORY AND PHYSICAL EXAM    Date Time: 02/24/17 3:59 PM  Patient Name: Kristina [name]  Attending Physician: Clovis Riley, MD  Primary Care Physician: Patsy Lager, MD    CC: Dysarthria, diarrhea    History of Presenting Illness:   Kristina [name] is a 83 y.o. female who presents to the hospital with  Main problem of dysarthria and  persistent diarrhea.  This a patient that was discharged from this facility on 02/21/2017, at that time she was admitted for abdominal pain with bloody diarrhea, CT scan showed ascending colitis so she was discharged on antibiotics and she was asked to stop eliquis for 3 days   Patient said that diarrhea has gotten better but has not gone away completely, yesterday she had 2 bowel movements that were nonbloody, today in the morning the daughter noticed that the patient was having slurred speech and sometimes problem getting the words out so the patient was brought to the ER, CAT scan showed initial concern for intracranial bleeding but then MRI showed acute CVA on the left operculum with hemorrhagic conversion, neurology had been consulted and they already saw the patient. Vital signs are stable so was called for admission.  I discussed the case with Dr. Nunzio Cory neurologist on call who said that the patient would be okay to be admitted to the floor as this is a primarily ischemic event and the patient is a stable        Past Medical History:     Past Medical History:   Diagnosis Date   . A-fib    . Hypertension    . Melanoma        Past Surgical History:     Past Surgical History:   Procedure Laterality Date   . arm surgery      roght   . HIP SURGERY      left       Family History:   Denies significant family history    Social History:     Social History     Social History   . Marital status: Widowed     Spouse name: N/A   . Number of children: N/A   . Years of education: N/A     Occupational History   . Not on file.     Social History Main Topics   . Smoking status:  Never Smoker   . Smokeless tobacco: Never Used   . Alcohol use Yes   . Drug use: No   . Sexual activity: Not on file     Other Topics Concern   . Not on file     Social History Narrative   . No narrative on file       Allergies:     Allergies   Allergen Reactions   . Lisinopril Anaphylaxis       Medications:   No current facility-administered medications for this encounter.     Current Outpatient Prescriptions:   .  amoxicillin-clavulanate (AUGMENTIN) 875-125 MG per tablet, Take 1 tablet by mouth 2 (two) times daily.for 6 days, Disp: 12 tablet, Rfl: 0  .  dilTIAZem (CARDIZEM CD) 240 MG 24 hr capsule, Take 240 mg by mouth every morning.  , Disp: , Rfl:   .  folic acid (FOLVITE) 1 MG tablet, Take 1 mg by mouth daily., Disp: , Rfl:   .  furosemide (LASIX) 20 MG tablet, Take 1 tablet (20 mg total) by mouth daily.for 7 days, Disp: 7 tablet,  Rfl: 0  .  lactase (LACTAID) 3000 units tablet, Take 1 tablet by mouth 3 (three) times daily with meals., Disp: , Rfl:   .  lactobacillus species (BIO-K PLUS) capsule, Take 1 capsule (50 Billion CFU total) by mouth daily., Disp: 30 capsule, Rfl: 0  .  latanoprost (XALATAN) 0.005 % ophthalmic solution, Place 1 drop into both eyes nightly.  , Disp: , Rfl:   .  levothyroxine (SYNTHROID, LEVOTHROID) 112 MCG tablet, Take 112 mcg by mouth every morning.  , Disp: , Rfl:   .  LUMIGAN 0.01 % Solution, Place 1 drop into both eyes nightly., Disp: , Rfl:   .  metoprolol tartrate (LOPRESSOR) 25 MG tablet, Take 25 mg by mouth 2 (two) times daily.  , Disp: , Rfl:   .  potassium chloride (K-TAB, KLOR-CON) 10 MEQ tablet, Take 2 tablets (20 mEq total) by mouth daily.for 7 days, Disp: 14 tablet, Rfl: 0  .  SIMBRINZA 1-0.2 % Suspension, Place 1 drop into both eyes every morning., Disp: , Rfl:   .  fluticasone (FLONASE) 50 MCG/ACT nasal spray, 1 spray by Nasal route daily as needed for Rhinitis or Allergies.  , Disp: , Rfl:   .  methotrexate 2.5 MG tablet, Take 10 mg by mouth Once a week on Monday  evening.  , Disp: , Rfl:   .  nitroglycerin (NITROSTAT) 0.4 MG SL tablet, Place 0.4 mg under the tongue every 5 (five) minutes as needed for Chest pain.  , Disp: , Rfl:   .  PROAIR HFA 108 (90 Base) MCG/ACT inhaler, Inhale 2 puffs into the lungs 2 (two) times daily as needed for Wheezing or Shortness of Breath.  , Disp: , Rfl:      Review of Systems:   All other systems were reviewed and negative except as noted above in HPI.       Physical Exam:   BP (!) 162/103   Pulse 100   Temp 97.8 F (36.6 C) (Tympanic)   Resp 18   Ht 1.549 m (5\' 1" )   Wt 50.6 kg (111 lb 8.8 oz)   SpO2 94%   BMI 21.08 kg/m    Body mass index is 21.08 kg/m.    Constitutional: awake, alert, oriented x 3; no acute distress. cooperative  Eyes: perrla, eomi, sclera anicteric   ENT: oropharynx clear without lesions, mucous membranes moist  Neck: supple, no lymphadenopathy, no thyromegaly, no JVD, no carotid bruits  Cardiovascular: regular rate and rhythm, no murmurs, rubs or gallops  Lungs: clear to auscultation bilaterally, without wheezing, rhonchi, or rales  Abdomen: soft, non-tender, non-distended; no palpable masses, no hepatosplenomegaly, normoactive bowel sounds, no rebound or guarding  Extremities: no clubbing, cyanosis, or edema    Neuro: cranial nerves grossly intact, strength 5/5 in upper and lower extremities sensation intact,  Expressive aphasia  Skin: no rashes or lesions noted  Psychiatric: Awake, alert, orientedx3    Labs:     Results     Procedure Component Value Units Date/Time    CBC [[id number]]  (Abnormal) Collected:  02/24/17 1019    Specimen:  Blood from Blood Updated:  02/24/17 1110     WBC 9.1 K/cmm      RBC 4.46 M/cmm      Hemoglobin 15.2 gm/dL      Hematocrit 40.9 %      MCV 107 (H) fL      MCH 34 pg      MCHC 32 gm/dL  RDW 14.1 (H) %      PLT CT 181 K/cmm      MPV 8.0 fL      NEUTROPHIL % 64.6 %      Lymphocytes 27.0 %      Monocytes 7.0 %      Eosinophils % 0.6 %      Basophils % 0.8 %      Neutrophils  Absolute 5.9 K/cmm      Lymphocytes Absolute 2.5 K/cmm      Monocytes Absolute 0.6 K/cmm      Eosinophils Absolute 0.1 K/cmm      BASO Absolute 0.1 K/cmm      RBC Morphology RBC Morphology Reviewed     Macrocytic 1+     Anisocytosis 1+    CMP [[id number]]  (Abnormal) Collected:  02/24/17 1019    Specimen:  Plasma Updated:  02/24/17 1049     Sodium 139 mMol/L      Potassium 4.3 mMol/L      Chloride 106 mMol/L      CO2 19.9 (L) mMol/L      Calcium 9.8 mg/dL      Glucose 95 mg/dL      Creatinine 4.09 mg/dL      BUN 20 mg/dL      Protein, Total 6.9 gm/dL      Albumin 4.1 gm/dL      Alkaline Phosphatase 81 U/L      ALT 16 U/L      AST (SGOT) 38 U/L      Bilirubin, Total 1.0 mg/dL      Albumin/Globulin Ratio 1.46 Ratio      Anion Gap 17.4 mMol/L      BUN/Creatinine Ratio 24.4 Ratio      EGFR 65 mL/min/1.61m2      Osmolality Calc 280 mOsm/kg      Globulin 2.8 gm/dL     Lipase [[id number]] Collected:  02/24/17 1019    Specimen:  Plasma Updated:  02/24/17 1049     Lipase 12 U/L           Imaging:     Radiology Results (24 Hour)     Procedure Component Value Units Date/Time    MRI Brain W WO Contrast [[id number]] Collected:  02/24/17 1423    Order Status:  Completed Updated:  02/24/17 1438    Narrative:       Clinical History:  83 year old with difficulty with speech articulation and confusion.  Abn Head CT showing a bleed.  10ml Dotarem    Technique:  MRI scan of the brain is obtained utilizing 3 mm axial slices with T1, T2, FLAIR, and susceptibility weighted images; 4 m sagittal slices to obtain T1 weighted images.  Echoplanar 4 mm diffusion-weighted images were obtained. T1-weighted views were   obtained postcontrast in the axial, sagittal and coronal planes.    Contrast:  GADOTERATE MEGLUMINE 7.5 MMOL/15ML IV SOLN/5 mmol    Comparison:  CT scan of the head February 24, 2017    Findings:  There is an acute cortical infarction of the left operculofrontal temporal region within the vascular distribution of branches the left  middle cerebral artery. There is hemorrhagic feature to the cortical infarction without clinically significant mass   effect. There is a chronic microhemorrhage within the left thalamus. There is no significant abnormal enhancement. Cortical sulci, fissures and cisterns demonstrate moderate widening. There is mild to moderate enlargement of the lateral and third   ventricles. The orbits and calvaria are unremarkable. There  is mild mucosal thickening within the ethmoid and left sphenoid sinus.      Impression:       Abnormal MRI scan of the head demonstrating:  1. There is an acute cortical infarction of the left operculofrontal region with hemorrhagic features without significant mass effect  2. There is a chronic microhemorrhage in the left thalamus  3. Moderate cortical and subcortical atrophy.  4. Mild chronic microvascular ischemic disease.    ReadingStation:[id number]    CT Head WO Contrast [[id number]] Collected:  02/24/17 1234    Order Status:  Completed Updated:  02/24/17 1253    Narrative:       Clinical History:  Reason For Exam: talking less  Pt presents with confusion, unable to tell me her name or why she is here.     Examination:  CT head without intravenous contrast.    CT images were acquired utilizing Automated Exposure Control for dose reduction.     Comparison:  None available.    Findings:  Ventricles are minimally asymmetric secondary to mild effacement of the left lateral ventricle. Additionally, there is approximately 3 mm left-to-right midline shift.    There is an area of diffuse edema and/or loss of gray-white matter differentiation involving the left frontal lobe, with superimposed foci of subarachnoid hemorrhage identified. A calcified meningioma is identified in the same region along the left   frontal convexity measuring 8 mm AP by 6 mm transverse.    The orbits are notable for bilateral cataract change. Paranasal sinuses are normally aerated. Mastoid air cells normally  aerated.    There is no acute bony abnormality.      Impression:       Subarachnoid hemorrhage in left frontal lobe, with surrounding vasogenic edema, loss of gray-white matter differentiation, mild effacement of left lateral ventricle, and 3 mm left-to-right midline shift. This mostly involves the region of Broca's area,   which may explain the patient's difficulty with speech articulation. An underlying mass cannot be excluded. A stat MRI of the brain with contrast is recommended for further characterization.    Findings were discussed with Dr. Nunzio Cory in the emergency room at 12:50pm on the 10th of December, 2018.    ReadingStation:[id number]             Assessment / Plan:   Principal Problem:    Cerebrovascular accident (CVA) with intracranial hemorrhage   discussed with neurologist, plan is o admit to medicine, neuro floor, NP already saw pt  No OAC and no ASA for now, CT will be repeated tomorrow by neuro    Active Problems:    PAF (paroxysmal atrial fibrillation)  Cont to hold eliquis due to hemorrhagic conversion  On d/c will likely require just ASA but will let neuro decide  Hold bb and cardizem for 24h , hopefulle HR will remain ok otherwise will have to push prn metoprolol      HTN (hypertension)  Hold BP meds including metoproll and cardziem for now      Hypothyroidism  Stable, cont synthroid      Chronic systolic (congestive) heart failure  Recent ef 35 to 40%, no acute exacerbation, watch for overload     Acute colitis  Still recovering but has improved  Cotntinue  ATB, will choose cipro/flagyl iv for now        Signed by: Roanna Epley, MD -   cc:Pcp, Kathreen Cosier, MD

## 2017-02-24 NOTE — ED Triage Notes (Signed)
Pt reports having diarrhea since Thursday am. Seen here in ED on Thursday with admission to the hospital due to Colitis. Discharged on Friday with abx. Reports diarrhea not improved. Denies fevers, abd, nausea, or vomiting.

## 2017-02-24 NOTE — ED Notes (Signed)
Pt does not need RN monitoring in MRI per Dr. Nunzio Cory, MD in ED.

## 2017-02-24 NOTE — Progress Notes (Signed)
Neurosurgery Update Note      Assessment/Plan:   If a neurosurgical lesion is found, please call for a formal consult.  I see no role for neurosurgical intervention based on the head CT.    Blanchard Mane, MD   Division of Neurosurgery   [facility]     HPI     Kristina [name] is a 83 y.o. female who presents with continued diarrhea. She was seen on 02/21/2017 for diarrhea and rectal bleeding. Absent with hemoglobin 14.3 and hematocrit 41.8. CT scan of abdomen/pelvis with IV contrast shows bowel wall thickening along the ascending colon suggestive of colitis. She was admitted to the hospital for 1 day for IV antibiotics and IV fluids. She was discharged home on Augmentin. Her diarrhea is a little worse and her daughter thinks it is related to the antibiotics. She is talkative but is not talking much. She just moved locally from [place] in October. She has appointment with the [facility] tomorrow to help her find a PCP.    I was contacted on the request of radiology.    Radiology     Ct Head Wo Contrast    Result Date: 02/24/2017  Subarachnoid hemorrhage in left frontal lobe, with surrounding vasogenic edema, loss of gray-white matter differentiation, mild effacement of left lateral ventricle, and 3 mm left-to-right midline shift. This mostly involves the region of Broca's area, which may explain the patient's difficulty with speech articulation. An underlying mass cannot be excluded. A stat MRI of the brain with contrast is recommended for further characterization. Findings were discussed with Dr. Nunzio Cory in the emergency room at 12:50pm on the 10th of December, 2018. ReadingStation:[id number]

## 2017-02-24 NOTE — ED Notes (Signed)
Speech pathologist recommeding PO medication whole in applesauce and regular diet for now. Speech to follow up with pt tomorrow.

## 2017-02-24 NOTE — ED Notes (Signed)
Meal tray requested

## 2017-02-24 NOTE — Plan of Care (Signed)
Advance Care Planning Services - [facility]      Patient Name: Kristina [name],Kristina [name] LOS: 0 days   Attending Physician: Clovis Riley, MD   Primary Care Physician: Patsy Lager, MD   Narrative of Meeting [facility]   Events prompting this discussion:  CVA    Participants: patient and daughter Kristina [name]    The types of care discussed:  Standard of care for disease and Resuscitation with CPR    Patient/Surrogate wishes and goals for treatment:  I have discussed about healthcare proxy/POA with the patient. Patient wants cammie faulstich  to be medical decision maker if patient cant make medical decisions.   Also discussed about long term goals of care given multiple medical co-morbidites (as mentioned above) and code status including CPR/intubation/vasopressors and BiPAP. Patient wants to     [x ] DNR with support (no CPR, no intubation but vasopressors and/or BiPAP ok)    Order placed in EPIC.    Active Problem List  Principal Problem:    Cerebrovascular accident (CVA) with intracranial hemorrhage  Active Problems:    PAF (paroxysmal atrial fibrillation)    HTN (hypertension)    Hypothyroidism    Chronic systolic (congestive) heart failure    Acute CVA (cerebrovascular accident)       Documents Filled Out As Part of Discussion               [facility]   None     The patient has  medical decision making capacity.     The patient/surrogate was informed that this session is completely voluntary and was given the opportunity to decline the ACP services. The patient/surrogate made the decision to participate in the ACP session with the discussion proceeding as described above.     Reference:  Detering et al, BMJ 2010; 340 doi: PopPath.it (Published 08 June 2008)  Advance care planning:    Improves end of life care   Improves patient & family satisfaction   Reduces stress, anxiety, & depression in surviving relatives   [phone] - first 30 minutes face-to-face    [phone] - each  additional 30 minutes face-to-face    I have spent 18 minutes on face-to-face Advance Care Planning Services   100% of the time was spent on discussion and counseling the patient and daughter.   No active management of the problems listed above was undertaken during the time period reported.            MRN: [id number]           CSN: [id number]  DOB: 06/26/1929   Roanna Epley, MD  02/24/17 4:20 PM   [facility]

## 2017-02-24 NOTE — SLP Eval Note (Signed)
[facility]  Speech Language Pathology   Speech/Language and Swallowing Evaluation    Patient:   Kristina [name]    CSN#: [id number]  Referring Physician:  Charter, Timoteo Expose, NP  Date of Referral: 12/25/16    REASON FOR REFERRAL:   Kristina [name] is a 83 y.o. female admitted 02/24/2017 for Acute CVA (cerebrovascular accident) was referred for SLP Evaluation and Treatment for s/p L CVA.    ASSESSMENT/PLAN/RECOMMENDATIONS:     Diagnosis:  Mild Oropharyngeal Dysphagia, Severe Broca's aphasia    Diet Recommendations:  Regular diet/Thin liquids   Additional restrictions:  Gluten restricted per pt diagnosis/medical hx and discussion with nurse.    Administration of Medications: Meds whole in applesauce/pureed pending tolerance of liquids    Precautions/Compensations:  Position patient upright 90 degrees for all po intake/meds, Keep patient upright 30 minutes after meals, Alternate liquids and solids (liquid wash), Take small bites/sips, Eat/feed slowly, Aspiration precautions, Reflux precautions  Suggestions for Feeding:  Close supervision  Prognosis:  Fair d/t advanced age, severity of deficits, though pt motivated and stimulable   Aspiration Risk: age >45, acute CVA  Modified Barium Swallow Study recommended: no;Not indicated at this time; will continue to monitor pt's needs  SLP Follow-Up Treatment needs: 3-5x/wk (M-F), pt/family training, swallowing tx, speech/language tx  Duration of Treatment: LOS  Projected Discharge Recommendations:  Patient will need ongoing SLP at next level of care, Acute Rehab  Other referrals:  n/a    Goals:   STG:  (in 3-4 days)  1. Pt will tolerate a REGULAR diet with THIN liquids free of signs/symptoms of aspiration. (NEW)  2. Pt will follow simple 2-step directions with 50% accuracy. (NEW)  3. Pt will ID objects from field of 2 with 50% accuracy. (NEW)  4. Pt will name objects with 25% accuracy independently. (NEW)  5. Pt/family/staff education (NEW)  LTG:  (By discharge)  1. Pt  will tolerate the least restrictive diet free of signs/symptoms of aspiration.  (NEW)  2. Pt will be mod assist for communication. (NEW)      History of present illness:     Medical Diagnosis: Acute CVA (cerebrovascular accident)    History of Present Illness:  Kristina [name] is a 83 y.o. female admitted on 02/24/2017 with   Patient Active Problem List   Diagnosis   . Hematochezia   . PAF (paroxysmal atrial fibrillation)   . HTN (hypertension)   . Hypokalemia   . Pleural effusion, right   . Hypothyroidism   . Chronic systolic (congestive) heart failure   . Cerebrovascular accident (CVA) with intracranial hemorrhage   . Acute CVA (cerebrovascular accident)        Past Medical/Surgical History:  Past Medical History:   Diagnosis Date   . A-fib    . Hypertension    . Melanoma       Past Surgical History:   Procedure Laterality Date   . arm surgery      roght   . HIP SURGERY      left       Baseline cognitive-communication/swallowing status:  No reported deficits in speech, language, cognitive-communication or swallowing skills prior to onset      SUBJECTIVE:     Patient is agreeable to participation in the therapy session. Nursing clears patient for therapy. Patient's medical condition is appropriate for Speech therapy intervention at this time.     S:  "I, umm, uhhh, I want... Umm."       OBJECTIVE:  Tests Administered/Score:  Bedside Western Aphasia Battery, Bedside Aphasia Score: +33/100 - Severe Broca's Aphasia;  Bedside Swallow evaluation - Mild Oropharyngeal Dysphagia    AUDITORY COMPREHENSION:  Yes/no:  reliable , simple level, 100% acc'y; 30% acc'y complex level  Open-ended questions:  Unable to reliably assess d/t severity of aphasia  Object ID:  Impaired, 25% acc'y, simple level  Following directions:  Impaired, 50% acc'y, simple level given repetition   Performance impacted by:  n/a      EXPRESSIVE LANGUAGE:    Confrontation Naming:  0% acc'y at familiar level increasing to 30% acc'y given carrier  phrase or written cue  Generative Naming:  Did not test d/t severity of aphasia  Repetition:  Not impaired at word or simple phrase level (though required increased time); phonemic paraphasias and jargon noted at complex phrase/sentence level  Picture description:  Impaired,  Incomplete description, effortful/hesitant, excess use of filler words, some pronouns, though no content words.   Conversation:  Pt with non-fluent verbal output characterized by halting/hesitant, anomic hesitations, paraphasic errors, excess use of filler words (umm, uhh, mhmm)  Ability to make needs known:  Impaired with increased complexity; most reliably when asked simple Y/N questions    Non-verbal Communication:  Intonation pattern:  Difficult to reliably comment d/t non-fluent aphasia; halting/effortful output  Facial expression:  Facial expression able to convey emotions and information at simple level.  Gestures:  Did not use gesture to assist verbal attempts  Augmentative:  Unable to utilize AAC at this time d/t impaired ability to ID objects in field of 3; also poor picture/meaning associations during evaluation    Written Expression:  Impaired; similar to expressive language; halting, effortful - paraphasias noted.  Able to write first name however phonemic paraphasias noted while writing last name, DOB, and city of residence.    Reading Comprehension:  Impaired;  75% acc'y at simple word level;  paraphasias and non-related word errors noted at phrase sentence level (e.g. "Some mixes open" for 'you know how'; "Down to an earthquake" for "down to earth.") - decreased insight into errors    MOTOR SPEECH/VOICE:  Pt with vocal quality and articulation WNL.    COGNITIVE-COMMUNICATION:   Orientation:  Pt is Ox3 (person, place when asked in Y/N format)  Attention:  Not impaired  Memory:  Unable to reliably assess d/t severity of aphasia  Problem solving:  simple nonverbal problem solving intact during evaluation (e.g. Independently  intiating sitting up to drink, moving phone/controller to allow more room on table for evaluation items)  Insight:  Decreased;  "Have you been having more trouble getting your words out? - no" though Y/N reliability during auditory verbal comprehension task performed with 100% acc'y at simple level  Safety:  mod assist for safety d/t decreased insight  Fall risk:  Defer to PT/OT  Supervision needs:  close supervision d/t expressive language deficits       SWALLOWING:  Observation of Patient/Vital Signs:    Positioning: Patient is upright in bed.  Respiratory status: Room air  Source of nutrition at time of eval:  NPO without access  Other medical equipment in place: IV, telemetry, O2 sat monitor     Oromotor skills:  Not impaired  Dentition: Upper dentures in place and well-fitting  Strength:  WFL  Coordination:  WFL  ROM: WFL  Symmetry:  WFL   Oral Apraxia:  N/A  Sensation:  WFL  Gag Reflex:  Not assessed  Laryngeal Elev:  +3 of 4 fingers width laryngeal  elevation palpated, mildly delayed initiation of the swallow reflex  Cough/Grunt:  strong cough on command, strong reflexive cough, produced immediately post swallow x2  Motor Speech/Voice:  WFL    Consistencies presented:  Pureed:   tsp x1; 1tsp x2  No signs/symptoms of aspiration, no change in respiratory status, no vocal change, timely initiation of the swallow reflex  Dysph Mech:   tsp x1; 1tsp x4  no change in respiratory status, no vocal change, timely initiation of the swallow reflex, immediate cough post swallow x1 (likely from juice from peaches) - no additional overt signs/symptoms of aspiration  Masticated:   tsp x1  No signs/symptoms of aspiration, no change in respiratory status, no vocal change, mildly prolonged but functional mastication  Thin Liquid: Cup x5; Straw x7  no change in respiratory status, no vocal change, timely initiation of the swallow reflex, strong immediate cough x1, no additional overt signs/symptoms of aspiration, passed 3oz  water test      Pt/Family/Caregiver Education Provided:     Patient was/were educated re: role of slp, purpose of evaluation, sign/symptoms/risks of aspiration, results/recommendations of today's evaluation, tx goals  Good understanding was verbalized/demonstrated: yes  Comprehension limited by: impaired cognitive-communication skills, aphasia    Team Communication:     Spoke to : RN/LPN - Tanya  Regarding: results/recommendations of today's evaluation, tx goals  Good understanding was verbalized: yes      G-CODES:           Spoken Language Expressive G Code Set  Spoken Language Expression, Current Status [phone]): At least 60 percent but less than 80 percent impaired, limited or restricted  Spoken Language Expression, Goal Status [phone]): At least 40 percent but less than 60 percent impaired, limited or restricted  Tools used to determine level of impairment:: Clinical judgement              Time of treatment:      SLP Received On: 02/24/17  Start Time: 1743  Stop Time: 1823  Time Calculation (min): 40 min    SLP Visit Number: 1                 Evaluation completed by:  Harrold Donath, M.S., CCC-SLP  Pager [phone]  Speech-Language Pathologist  [facility]

## 2017-02-24 NOTE — Plan of Care (Signed)
Problem: Day of Admission - Stroke  Goal: Core/Quality measure requirements - Admission  Outcome: Progressing   02/24/17 2349   Goal/Interventions addressed this shift   Core/Quality measure requirements - Admission Document NIH Stroke Scale on admission;Document nursing swallow/dysphagia screen on admission. If patient fails, keep patient NPO (follow your hospital protocol on swallowing screening).;VTE Prevention: Ensure anticoagulant(s) administered and/or anti-embolism stockings/devices documented as ordered;Ensure antithrombotic administered or contraindication documented by LIP;If diagnosis or history of Atrial Fib/Atrial Flutter, ensure oral anticoagulation is initiated or contraindication documented by LIP;Ensure lipid panel ordered;Begin stroke education on admission (must include Modifiable Risk Factors, Warning Signs and Symptoms of Stroke, Activation of Emergency Medical System and Follow-up Appointments) Ensure handout has been given and documented.;Ensure PT/OT and/or SLP ordered

## 2017-02-24 NOTE — Consults (Signed)
[facility] Patient    [facility]  [address]  [place], [place] [address]  [facility]: [phone]   [facility]: [phone]    NEUROLOGY CONSULT  NOTE    Date / Time: 02/24/17 4:22 PM  Patient Name: Kristina [name]  Attending Physician: Clovis Riley, Kristina [name]      CHIEF COMPLAINT:     Chief Complaint   Patient presents with   . Diarrhea       HISTORY OF PRESENT ILLNESS:   Kristina [name]:[id number], MRN: [id number], is a 83 y.o. female with history of atrial fibrillation, hypothyroid, HTN brought to ED for diarrhea and dehydration. Patient and family also reported difficulty speaking that was noticed when patient woke up around 7AM.     CT head initially read as left frontal lobe subarachnoid hemorrhage with edema and midline shift.   MRI brain was performed to further evaluate, and it was determined that abnormality was actually ischemic infarct with hemorrhagic conversion.     Patient was previously on Eliquis for a fib, but this was stopped during hospitalization last week for rectal bleed. DOAC stopped 02/21/17. Plan was to restart today, until imaging reveal hemorrhage.     ECHO performed 02/21/17 shows 35-40% EF, severe tricuspid regurgitation, moderate to severe pulmonic insufficiency, as well as other mild to moderate abnormalities.      REVIEW OF SYSTEMS:   Patient with expressive aphasia, unable to provide ROS.      CURRENT PROBLEM LIST:     Active Hospital Problems    Diagnosis   . Hypothyroidism   . Chronic systolic (congestive) heart failure   . Cerebrovascular accident (CVA) with intracranial hemorrhage   . Acute CVA (cerebrovascular accident)   . PAF (paroxysmal atrial fibrillation)   . HTN (hypertension)       PAST MEDICAL HISTORY:     Past Medical History:   Diagnosis Date   . A-fib    . Hypertension    . Melanoma        FAMILY HISTORY:   History reviewed. No pertinent family history.     SOCIAL HISTORY:     Social History     Social History   . Marital status: Widowed     Spouse  name: N/A   . Number of children: N/A   . Years of education: N/A     Social History Main Topics   . Smoking status: Never Smoker   . Smokeless tobacco: Never Used   . Alcohol use Yes   . Drug use: No   . Sexual activity: Not Asked     Other Topics Concern   . None     Social History Narrative   . None       ALLERGIES:     Allergies   Allergen Reactions   . Lisinopril Anaphylaxis       MEDICATIONS:     Prior to Admission medications    Medication Sig Start Date End Date Taking? Authorizing Provider   amoxicillin-clavulanate (AUGMENTIN) 875-125 MG per tablet Take 1 tablet by mouth 2 (two) times daily.for 6 days 02/21/17 02/27/17 Yes Theda Sers, Kristina [name]   dilTIAZem (CARDIZEM CD) 240 MG 24 hr capsule Take 240 mg by mouth every morning.     01/31/17  Yes Provider, Historical, Kristina [name]   folic acid (FOLVITE) 1 MG tablet Take 1 mg by mouth daily.   Yes Provider, Historical, Kristina [name]   furosemide (LASIX) 20 MG tablet Take 1 tablet (20 mg total)  by mouth daily.for 7 days 02/21/17 02/28/17 Yes Theda Sers, Kristina [name]   lactase (LACTAID) 3000 units tablet Take 1 tablet by mouth 3 (three) times daily with meals.   Yes Provider, Historical, Kristina [name]   lactobacillus species (BIO-K PLUS) capsule Take 1 capsule (50 Billion CFU total) by mouth daily. 02/22/17 03/24/17 Yes Theda Sers, Kristina [name]   latanoprost (XALATAN) 0.005 % ophthalmic solution Place 1 drop into both eyes nightly.       Yes Provider, Historical, Kristina [name]   levothyroxine (SYNTHROID, LEVOTHROID) 112 MCG tablet Take 112 mcg by mouth every morning.     01/20/17  Yes Provider, Historical, Kristina [name]   LUMIGAN 0.01 % Solution Place 1 drop into both eyes nightly. 01/31/17  Yes Provider, Historical, Kristina [name]   metoprolol tartrate (LOPRESSOR) 25 MG tablet Take 25 mg by mouth 2 (two) times daily.     01/31/17  Yes Provider, Historical, Kristina [name]   potassium chloride (K-TAB, KLOR-CON) 10 MEQ tablet Take 2 tablets (20 mEq total) by mouth daily.for 7 days 02/21/17 02/28/17 Yes Theda Sers, Kristina [name]   [place] 1-0.2 % Suspension Place 1 drop into both  eyes every morning. 01/31/17  Yes Provider, Historical, Kristina [name]   fluticasone (FLONASE) 50 MCG/ACT nasal spray 1 spray by Nasal route daily as needed for Rhinitis or Allergies.        Provider, Historical, Kristina [name]   methotrexate 2.5 MG tablet Take 10 mg by mouth Once a week on Monday evening.     01/21/17   Provider, Historical, Kristina [name]   nitroglycerin (NITROSTAT) 0.4 MG SL tablet Place 0.4 mg under the tongue every 5 (five) minutes as needed for Chest pain.     01/31/17   Provider, Historical, Kristina [name]   PROAIR HFA 108 (90 Base) MCG/ACT inhaler Inhale 2 puffs into the lungs 2 (two) times daily as needed for Wheezing or Shortness of Breath.     12/31/16   Provider, Historical, Kristina [name]               PHYSICAL EXAM:     Vitals:    02/24/17 1601   BP: 148/88   Pulse: (!) 105   Resp:    Temp:    SpO2:        General: Patient is a 83 year old female, in no acute distress.   Mental status: Awake, alert, with expressive aphasia. Unable to say her own name. Can follow some commands, others require demonstration. Unable to assess memory. When patient asked to identify objects in pictures, she says "yeah." When asked to read, she reads words incorrectly. Is able to read "mama," but then reads "breakfast" when the word is "thanks."   Cranial Nerve Exam:  CN 2 Normal vision without visual field defects   CN 3,4,6 EOMs intact without nystagmus, PERRLA  CN 5 Facial sensation is symmetrical.  CN 7 Spontaneous facial expressions are symmetrical.  CN 8 Auditory acuity intact bilaterally to casual conversation.  CN 9 Palate rises with phonation.  CN 11 Trapezius and sternocleidomastoids intact.  CN 12 Tongue protrudes in midline.  Motor: Power is 5/5 in upper extremities, 4/5 lower extremities. With normal tone and bulk for age. Mild right arm drift.   Sensory:  Sensation intact to light touch throughout.   Coordination: Patient does not follow commands for rapid alternating movements.   DTRs: 2+ and symmetric bilaterally  Gait: not tested    HEENT:  Head is  midline, normocephalic, atraumatic.   Cardiac:  RRR  Chest:  Breath sounds CTA bilaterally.  Abd:  Bowel sounds normoactive in all quadrants.    Extremities:  No deformities, cyanosis, clubbing or edema. No new rash or bruise.    SIGNIFICANT LABS:     Results     Procedure Component Value Units Date/Time    CBC [[id number]]  (Abnormal) Collected:  02/24/17 1019    Specimen:  Blood from Blood Updated:  02/24/17 1110     WBC 9.1 K/cmm      RBC 4.46 M/cmm      Hemoglobin 15.2 gm/dL      Hematocrit 40.9 %      MCV 107 (H) fL      MCH 34 pg      MCHC 32 gm/dL      RDW 81.1 (H) %      PLT CT 181 K/cmm      MPV 8.0 fL      NEUTROPHIL % 64.6 %      Lymphocytes 27.0 %      Monocytes 7.0 %      Eosinophils % 0.6 %      Basophils % 0.8 %      Neutrophils Absolute 5.9 K/cmm      Lymphocytes Absolute 2.5 K/cmm      Monocytes Absolute 0.6 K/cmm      Eosinophils Absolute 0.1 K/cmm      BASO Absolute 0.1 K/cmm      RBC Morphology RBC Morphology Reviewed     Macrocytic 1+     Anisocytosis 1+    CMP [[id number]]  (Abnormal) Collected:  02/24/17 1019    Specimen:  Plasma Updated:  02/24/17 1049     Sodium 139 mMol/L      Potassium 4.3 mMol/L      Chloride 106 mMol/L      CO2 19.9 (L) mMol/L      Calcium 9.8 mg/dL      Glucose 95 mg/dL      Creatinine 2.13 mg/dL      BUN 20 mg/dL      Protein, Total 6.9 gm/dL      Albumin 4.1 gm/dL      Alkaline Phosphatase 81 U/L      ALT 16 U/L      AST (SGOT) 38 U/L      Bilirubin, Total 1.0 mg/dL      Albumin/Globulin Ratio 1.46 Ratio      Anion Gap 17.4 mMol/L      BUN/Creatinine Ratio 24.4 Ratio      EGFR 65 mL/min/1.37m2      Osmolality Calc 280 mOsm/kg      Globulin 2.8 gm/dL     Lipase [[id number]] Collected:  02/24/17 1019    Specimen:  Plasma Updated:  02/24/17 1049     Lipase 12 U/L           SIGNIFICANT IMAGING:     MRI Brain W WO Contrast   Final Result   Abnormal MRI scan of the head demonstrating:   1. There is an acute cortical infarction of the left operculofrontal region with  hemorrhagic features without significant mass effect   2. There is a chronic microhemorrhage in the left thalamus   3. Moderate cortical and subcortical atrophy.   4. Mild chronic microvascular ischemic disease.      ReadingStation:[id number]      CT Head WO Contrast   Final Result   Subarachnoid hemorrhage in left frontal lobe, with surrounding vasogenic edema, loss of gray-white matter differentiation, mild effacement of left lateral ventricle,  and 3 mm left-to-right midline shift. This mostly involves the region of Broca's area,    which may explain the patient's difficulty with speech articulation. An underlying mass cannot be excluded. A stat MRI of the brain with contrast is recommended for further characterization.      Findings were discussed with Dr. Nunzio Cory in the emergency room at 12:50pm on the 10th of December, 2018.      ReadingStation:[id number]              ASSESSMENT AND PLAN:   Kristina [name], 83 year old female initially brought to hospital with diarrhea and dehydration, had imaging for expressive aphasia, found to have cerebral infarction with hemorrhagic conversion.     1. Diagnostics:  1. CT head reported as left frontal subarachnoid hemorrhage with edema and midline shift.  2. MRI brain reports left frontal cerebral infarction with hemorrhagic conversion.   3. ECHO completed 02/21/17. No need to repeat.   4. Will discuss with Dr. Nunzio Cory CDUS vs. CTA vs. MRA  5. Neuro checks and vital signs q4h  6. Telemetry.  2. Labs:   1. Stroke labs. Hgba1c, lipid panel, TSH, free T4, ESR, CRP, B12, folate pending  3. Medications:   1. Patient not candidate for tPA due to delay of presentation. LKW was prior to bed the night before.   2. Patient on Eliquis prior to rectal bleed last week. Recommendation would be to hold eliquis at this time, repeat CT outpatient in 3 weeks.   1. Discussed with pharmacist. Consider baby aspirin in interim.  3. Atorvastatin 80mg .   4. Rehabilitation services:    1. SLP  2. PT  3. OT    Case discussed with Dr. Nunzio Cory who will additionally review diagnostics/labs, personally evaluate the patient, further determine the treatment plan, and amend this note as indicated. Thank you for the consultation.    Cherah N Charter, AGAC-NP  02/24/2017  4:22 PM  [phone]    I have reviewed the notes, assessments, and/or procedures performed by NP Charter, I concur with her/his documentation of Kristina [name]. MRI brain confirmed a left frontal cerebral infarct with hemorrhagic conversion.  Images were reviewed. Follow up MRA neck.Pt not candidate for tpa with to delay in presentation. Hold Eliquis until ICH resolves based upon outpt CT head. Continue low dose ASA. Screen patient for possible OSA.  Avoid hypotension in acute CVA setting.  Neurological exam is notable for an expressive aphasia with no dysarthria.

## 2017-02-24 NOTE — ED Notes (Signed)
Pt unable to void or provide stool sample at this time. Informed of the need for sample.

## 2017-02-24 NOTE — ED Provider Notes (Signed)
[facility]  EMERGENCY DEPARTMENT  History and Physical Exam       Patient Name: Kristina [name], Kristina [name]  Encounter Date:  02/24/2017  Physician Assistant: Gweneth Dimitri, PA-C  Attending Physician: Clovis Riley, MD  PCP: Patsy Lager, MD  Patient DOB:  February 11, 1930  MRN:  [id number]  Room:  S23/S23-A      History of Presenting Illness     Chief complaint: Diarrhea    HPI/ROS given by: Patient    Kristina [name] is a 83 y.o. female who presents with continued diarrhea. She was seen on 02/21/2017 for diarrhea and rectal bleeding. She had labs with hemoglobin 14.3 and hematocrit 41.8. CT scan of abdomen/pelvis with IV contrast shows bowel wall thickening along the ascending colon suggestive of colitis. She was admitted to the hospital for 1 day for IV antibiotics and IV fluids. She was discharged home on Augmentin. She has atrial fibrillation on Eliquis, but it was discontinued due to the rectal bleeding. Her diarrhea is a little worse and her daughter thinks it is related to the antibiotics. She is talkative but is not talking much today. She just moved locally from [place] in October. She has appointment with [facility] tomorrow to help her find a PCP.       Review of Systems     Review of Systems   Constitutional: Negative for chills and fever.   HENT: Negative.    Respiratory: Negative.  Negative for cough, chest tightness and shortness of breath.    Cardiovascular: Negative for chest pain, palpitations and leg swelling.   Gastrointestinal: Positive for diarrhea. Negative for abdominal pain, nausea and vomiting.   Genitourinary: Negative.    Skin: Negative for rash.   Neurological: Positive for speech difficulty. Negative for dizziness, tremors, seizures, syncope, facial asymmetry, weakness, light-headedness, numbness and headaches.   Psychiatric/Behavioral: Negative for behavioral problems, confusion, decreased concentration and sleep disturbance. The patient is not nervous/anxious.        Rest of review of systems is negative.  Allergies & Medications     Pt is allergic to lisinopril.    Current/Home Medications    AMOXICILLIN-CLAVULANATE (AUGMENTIN) 875-125 MG PER TABLET    Take 1 tablet by mouth 2 (two) times daily.for 6 days    DILTIAZEM (CARDIZEM CD) 240 MG 24 HR CAPSULE    Take 240 mg by mouth every morning.        FLUTICASONE (FLONASE) 50 MCG/ACT NASAL SPRAY    1 spray by Nasal route daily as needed for Rhinitis or Allergies.        FOLIC ACID (FOLVITE) 1 MG TABLET    Take 1 mg by mouth daily.    FUROSEMIDE (LASIX) 20 MG TABLET    Take 1 tablet (20 mg total) by mouth daily.for 7 days    LACTASE (LACTAID) 3000 UNITS TABLET    Take 1 tablet by mouth 3 (three) times daily with meals.    LACTOBACILLUS SPECIES (BIO-K PLUS) CAPSULE    Take 1 capsule (50 Billion CFU total) by mouth daily.    LATANOPROST (XALATAN) 0.005 % OPHTHALMIC SOLUTION    Place 1 drop into both eyes nightly.        LEVOTHYROXINE (SYNTHROID, LEVOTHROID) 112 MCG TABLET    Take 112 mcg by mouth every morning.        LUMIGAN 0.01 % SOLUTION    Place 1 drop into both eyes nightly.    METHOTREXATE 2.5 MG TABLET    Take 10  mg by mouth Once a week on Monday evening.        METOPROLOL TARTRATE (LOPRESSOR) 25 MG TABLET    Take 25 mg by mouth 2 (two) times daily.        NITROGLYCERIN (NITROSTAT) 0.4 MG SL TABLET    Place 0.4 mg under the tongue every 5 (five) minutes as needed for Chest pain.        POTASSIUM CHLORIDE (K-TAB, KLOR-CON) 10 MEQ TABLET    Take 2 tablets (20 mEq total) by mouth daily.for 7 days    PROAIR HFA 108 (90 BASE) MCG/ACT INHALER    Inhale 2 puffs into the lungs 2 (two) times daily as needed for Wheezing or Shortness of Breath.        SIMBRINZA 1-0.2 % SUSPENSION    Place 1 drop into both eyes every morning.        Past Medical History     Pt has a past medical history of A-fib; Hypertension; and Melanoma.     Past Surgical History     Pt  has a past surgical history that includes Hip surgery and arm surgery.      Family History     The family history is not on file.     Social History     Pt reports that she has never smoked. She has never used smokeless tobacco. She reports that she drinks alcohol. She reports that she does not use drugs.     Physical Exam     Blood pressure (!) 162/103, pulse 100, temperature 97.8 F (36.6 C), temperature source Tympanic, resp. rate 18, height 1.549 m, weight 50.6 kg, SpO2 94 %.      Constitutional: Not in distress. Well-hydrated.  HEENT: No facial droop or weakness. Eyes: NC/AT, PERRL, no scleral icterus or conjunctival pallor. Nose: no nasal discharge. Oral: MMM, oropharynx without erythema or exudate.  Neck: Trachea midline, supple, no thyroidmegaly or masses.  Cardiovascular: RRR, normal S1 S2, no murmurs, gallops, or rubs.   Respiratory: Normal rate. Clear to auscultation bilaterally.  Gastrointestinal: +BS, non-distended, soft, non-tender, no rebound or guarding, no hepatosplenomegaly.  Genitourinary: No suprapubic or costovertebral angle tenderness.  Musculoskeletal: Normal appearance. Dorsal pedalis and radial pulses 2+ and symmetric.  Extremities: No edema or calf tenderness.  Skin: No rashes, lesions, or jaundice.  Neurologic: EOMI, CN 2-12 grossly intact. No gross motor or sensory deficits. No upper or lower extremity weakness. No gait abnormality. Difficulty speaking/articulating.  Psychiatric: AAOx3, affect and mood appropriate. Alert, interactive, and appropriate.         Diagnostic Results     The results of the diagnostic studies below have been reviewed by myself:    Labs  Results     Procedure Component Value Units Date/Time    CBC [[id number]]  (Abnormal) Collected:  02/24/17 1019    Specimen:  Blood from Blood Updated:  02/24/17 1110     WBC 9.1 K/cmm      RBC 4.46 M/cmm      Hemoglobin 15.2 gm/dL      Hematocrit 47.8 %      MCV 107 (H) fL      MCH 34 pg      MCHC 32 gm/dL      RDW 29.5 (H) %      PLT CT 181 K/cmm      MPV 8.0 fL      NEUTROPHIL % 64.6 %       Lymphocytes 27.0 %  Monocytes 7.0 %      Eosinophils % 0.6 %      Basophils % 0.8 %      Neutrophils Absolute 5.9 K/cmm      Lymphocytes Absolute 2.5 K/cmm      Monocytes Absolute 0.6 K/cmm      Eosinophils Absolute 0.1 K/cmm      BASO Absolute 0.1 K/cmm      RBC Morphology RBC Morphology Reviewed     Macrocytic 1+     Anisocytosis 1+    CMP [[id number]]  (Abnormal) Collected:  02/24/17 1019    Specimen:  Plasma Updated:  02/24/17 1049     Sodium 139 mMol/L      Potassium 4.3 mMol/L      Chloride 106 mMol/L      CO2 19.9 (L) mMol/L      Calcium 9.8 mg/dL      Glucose 95 mg/dL      Creatinine 0.63 mg/dL      BUN 20 mg/dL      Protein, Total 6.9 gm/dL      Albumin 4.1 gm/dL      Alkaline Phosphatase 81 U/L      ALT 16 U/L      AST (SGOT) 38 U/L      Bilirubin, Total 1.0 mg/dL      Albumin/Globulin Ratio 1.46 Ratio      Anion Gap 17.4 mMol/L      BUN/Creatinine Ratio 24.4 Ratio      EGFR 65 mL/min/1.43m2      Osmolality Calc 280 mOsm/kg      Globulin 2.8 gm/dL     Lipase [[id number]] Collected:  02/24/17 1019    Specimen:  Plasma Updated:  02/24/17 1049     Lipase 12 U/L           Radiologic Studies  Ct Head Wo Contrast    Result Date: 02/24/2017  Subarachnoid hemorrhage in left frontal lobe, with surrounding vasogenic edema, loss of gray-white matter differentiation, mild effacement of left lateral ventricle, and 3 mm left-to-right midline shift. This mostly involves the region of Broca's area, which may explain the patient's difficulty with speech articulation. An underlying mass cannot be excluded. A stat MRI of the brain with contrast is recommended for further characterization. Findings were discussed with Dr. Nunzio Cory in the emergency room at 12:50pm on the 10th of December, 2018. ReadingStation:[email or web address]    Mri Brain W Wo Contrast    Result Date: 02/24/2017  Abnormal MRI scan of the head demonstrating: 1. There is an acute cortical infarction of the left operculofrontal region with hemorrhagic features without  significant mass effect 2. There is a chronic microhemorrhage in the left thalamus 3. Moderate cortical and subcortical atrophy. 4. Mild chronic microvascular ischemic disease. ReadingStation:[id number]           ED Course and Medical Decision Making     ED Medication Orders     Start Ordered     Status Ordering Provider    02/24/17 1005 02/24/17 1004  sodium chloride 0.9 % bolus 500 mL  Once in ED     Route: Intravenous  Ordered Dose: 500 mL     Last MAR action:  Stopped LONG, JOSHUA D        Patient was recently diagnosed with colitis on Augmentin who was seen for worsening diarrhea and difficulty speaking/articulating that started this morning.    Labs: CBC, CMP, lipase, urinalysis, stool cultures, and C. difficile    Head CT scan  shows a subdural hemorrhage in the left frontal area with surrounding vasogenic edema, loss of gray-white matter differentiation, mild effacement of left lateral ventricle, and a 3 mm left-to-right midline shift involving mostly the region of the Broca area.    Her Eliquis was recently discontinued 02/21/2017 because of the rectal bleeding. She was supposed to start it back up today which she has not done yet. She did not fall recently, but fell about 3-4 weeks ago.    Dr. Barbara Cower spoke to Dr. Azzie Almas at [phone]. He recommended talking to Dr. Fransico Meadow.    I spoke to [facility] at 1315 and he recommended talking to the ICU.    MRI of the brain with contrast shows no intracranial bleeding, however, she does have a acute cortical infarction of the left operculofrontal region with hemorrhagic features without significant mass affect and with chronic microhemorrhages in the left thalamus.    I spoke to [facility] at 1440 and the patient will be admitted to medicine for further treatment and management.    I spoke to [employer], NP at 1455, who is just about to walk into the room to evaluate the patient.    In addition to the above history, please see nursing notes.  Allergies, meds, past medical, family, social hx, and the results of the diagnostic studies performed have been reviewed by myself.    I discussed this case with the attending physician in the emergency department and they agree with the assessment and treatment plan.     This chart was generated by an EMR and may contain errors or omissions not intended by the user.     Procedures / Critical Care     None     Diagnosis / Disposition     Clinical Impression  1. Cerebrovascular accident (CVA), unspecified mechanism    2. Speech articulation disorder    3. Colitis    4. Hypertension, unspecified type        Disposition  ED Disposition     ED Disposition Condition Date/Time Comment    Admit  Mon Feb 24, 2017  1:01 PM Admission Comment: SOUND: neuro consult              Follow up for Discharged Patients  No follow-up provider specified.    Prescriptions for Discharged Patients  New Prescriptions    No medications on file               Margret Chance, [place]  02/24/17 1539       Clovis Riley, MD  02/24/17 [phone]

## 2017-02-25 ENCOUNTER — Ambulatory Visit: Payer: Medicare Other | Admitting: Family

## 2017-02-25 ENCOUNTER — Inpatient Hospital Stay: Payer: Medicare Other

## 2017-02-25 LAB — LIPID PANEL
Cholesterol: 137 mg/dL (ref 75–199)
Coronary Heart Disease Risk: 4.15
HDL: 33 mg/dL — ABNORMAL LOW (ref 45–65)
LDL Calculated: 86 mg/dL
Triglycerides: 90 mg/dL (ref 10–150)
VLDL: 18 (ref 0–40)

## 2017-02-25 LAB — CBC AND DIFFERENTIAL
Basophils %: 0.5 % (ref 0.0–3.0)
Basophils Absolute: 0 10*3/uL (ref 0.0–0.3)
Eosinophils %: 0.5 % (ref 0.0–7.0)
Eosinophils Absolute: 0 10*3/uL (ref 0.0–0.8)
Hematocrit: 41.5 % (ref 36.0–48.0)
Hemoglobin: 13.9 gm/dL (ref 12.0–16.0)
Lymphocytes Absolute: 1.5 10*3/uL (ref 0.6–5.1)
Lymphocytes: 19.1 % (ref 15.0–46.0)
MCH: 36 pg — ABNORMAL HIGH (ref 28–35)
MCHC: 33 gm/dL (ref 32–36)
MCV: 106 fL — ABNORMAL HIGH (ref 80–100)
MPV: 8.2 fL (ref 6.0–10.0)
Monocytes Absolute: 0.8 10*3/uL (ref 0.1–1.7)
Monocytes: 10 % (ref 3.0–15.0)
Neutrophils %: 69.9 % (ref 42.0–78.0)
Neutrophils Absolute: 5.4 10*3/uL (ref 1.7–8.6)
PLT CT: 179 10*3/uL (ref 130–440)
RBC: 3.9 10*6/uL (ref 3.80–5.00)
RDW: 13.9 % (ref 11.0–14.0)
WBC: 7.8 10*3/uL (ref 4.0–11.0)

## 2017-02-25 LAB — BASIC METABOLIC PANEL
Anion Gap: 12.6 mMol/L (ref 7.0–18.0)
BUN / Creatinine Ratio: 20.6 Ratio (ref 10.0–30.0)
BUN: 14 mg/dL (ref 7–22)
CO2: 20.9 mMol/L (ref 20.0–30.0)
Calcium: 8.7 mg/dL (ref 8.5–10.5)
Chloride: 106 mMol/L (ref 98–110)
Creatinine: 0.68 mg/dL (ref 0.60–1.20)
EGFR: 80 mL/min/{1.73_m2} (ref 60–150)
Glucose: 92 mg/dL (ref 71–99)
Osmolality Calc: 272 mOsm/kg — ABNORMAL LOW (ref 275–300)
Potassium: 3.5 mMol/L (ref 3.5–5.3)
Sodium: 136 mMol/L (ref 136–147)

## 2017-02-25 LAB — VH C. DIFFICILE TOXIN B GENE BY DNA AMPLIFICATION: Stool Clostridium difficile Toxin B Gene DNA Amplification: NEGATIVE

## 2017-02-25 MED ORDER — METRONIDAZOLE 250 MG PO TABS
500.00 mg | ORAL_TABLET | Freq: Three times a day (TID) | ORAL | Status: DC
Start: 2017-02-25 — End: 2017-03-02
  Administered 2017-02-25 – 2017-03-02 (×15): 500 mg via ORAL
  Filled 2017-02-25 (×18): qty 2

## 2017-02-25 MED ORDER — CIPROFLOXACIN HCL 500 MG PO TABS
500.00 mg | ORAL_TABLET | Freq: Two times a day (BID) | ORAL | Status: DC
Start: 2017-02-25 — End: 2017-02-28
  Administered 2017-02-25 – 2017-02-28 (×6): 500 mg via ORAL
  Filled 2017-02-25 (×7): qty 1

## 2017-02-25 MED ORDER — IOHEXOL 350 MG/ML IV SOLN
50.00 mL | Freq: Once | INTRAVENOUS | Status: AC | PRN
Start: 2017-02-25 — End: 2017-02-25
  Administered 2017-02-25: 13:00:00 50 mL via INTRAVENOUS

## 2017-02-25 MED ORDER — ASPIRIN 81 MG PO CHEW
81.00 mg | CHEWABLE_TABLET | Freq: Every day | ORAL | Status: DC
Start: 2017-02-25 — End: 2017-03-04
  Administered 2017-02-25 – 2017-03-04 (×8): 81 mg via ORAL
  Filled 2017-02-25 (×8): qty 1

## 2017-02-25 NOTE — Progress Note - Problem Oriented Charting Notewrit (Signed)
Date Time:  02/25/17 9:01 AM Attending: Marlou Sa, MD   Pt. Name:  Kristina [name]     PCP: Patsy Lager, MD          INTERIM SUMMARY:    83 year old female with a history of A. fib, on eliquis, who was recently admitted for bloody colitis, discharged from this facility on December 7, eliquis discontinued who presented with acute CVA.          SUBJECTIVE:    She feels that her speech is slightly better. No other neurological complaints. 2 loose bowel movements this morning.         Objective :  Vitals:    02/25/17 0521 02/25/17 0801 02/25/17 0803 02/25/17 0817   BP:  (!) 142/100     Pulse: 97 (!) 121 (!) 106 (!) 106   Resp:  16     Temp:  99.1 F (37.3 C)     TempSrc:  Oral     SpO2:  90%     Weight:       Height:           Intake/Output Summary (Last 24 hours) at 02/25/17 0901  Last data filed at 02/24/17 1244   Gross per 24 hour   Intake              500 ml   Output                0 ml   Net              500 ml     Weight Monitoring 02/20/2017 02/24/2017 02/24/2017   Height - 154.9 cm 154.9 cm   Height Method - Stated -   Weight 50.576 kg 50.6 kg 50.6 kg   Weight Method Standing Scale - -   BMI (calculated) - 21.1 kg/m2 21.1 kg/m2       Body mass index is 21.09 kg/m.        MEDS: (SCHEDULED/INFUSIONS/PRN)  Current Facility-Administered Medications   Medication Dose Route Frequency   . aspirin  81 mg Oral Daily   . atorvastatin  80 mg Oral QHS   . ciprofloxacin  400 mg Intravenous Q12H   . fluticasone  1 spray Each Nare Daily   . latanoprost  1 drop Both Eyes QHS   . levothyroxine  112 mcg Oral QAM   . metroNIDAZOLE  500 mg Intravenous Q8H   . sodium chloride (PF)  3 mL Intravenous Q8H     Current Facility-Administered Medications   Medication Dose Route Frequency Last Rate     Current Facility-Administered Medications   Medication Dose Route   . acetaminophen  650 mg Oral    Or   . acetaminophen  650 mg per NG tube    Or   . acetaminophen  650 mg  Rectal   . hydrALAZINE  10 mg Intravenous   . metoprolol tartrate  5 mg Intravenous   . naloxone  0.4 mg Intravenous            LABS:    Recent CBC:    Recent Labs  Lab 02/25/17  0344 02/24/17  1019   WBC 7.8 9.1   RBC 3.90 4.46   Hemoglobin 13.9 15.2   Hematocrit 41.5 47.6   MCV 106* 107*   PLT CT 179 181       Recent BMP:    Recent Labs  Lab 02/25/17  0344 02/24/17  1019   Sodium  136 139   Potassium 3.5 4.3   Chloride 106 106   CO2 20.9 19.9*   BUN 14 20   Creatinine 0.68 0.82   Glucose 92 95   EGFR 80 65   Calcium 8.7 9.8       Other Labs (if any):  LFTs     -   Recent Labs  Lab 02/21/17  0551   Magnesium 1.6      Recent Labs  Lab 02/24/17  1019 02/20/17  0850   ALT 16 16   AST (SGOT) 38 32   Bilirubin, Total 1.0 1.4*   Bilirubin, Direct  --  0.6*   Albumin 4.1 4.4   Alkaline Phosphatase 81 103     Cardiac -     PT/PTT -   Recent Labs  Lab 02/20/17  0850   PT INR 1.1          IMAGING STUDIES:  Xr Chest Pa Or Ap    Result Date: 02/21/2017  No pneumothorax status post right-sided thoracentesis. ReadingStation:[email or web address]    Ct Head Wo Contrast    Result Date: 02/24/2017  Subarachnoid hemorrhage in left frontal lobe, with surrounding vasogenic edema, loss of gray-white matter differentiation, mild effacement of left lateral ventricle, and 3 mm left-to-right midline shift. This mostly involves the region of Broca's area, which may explain the patient's difficulty with speech articulation. An underlying mass cannot be excluded. A stat MRI of the brain with contrast is recommended for further characterization. Findings were discussed with Dr. Nunzio Cory in the emergency room at 12:50pm on the 10th of December, 2018. ReadingStation:[email or web address]    Ct Chest W Contrast (trauma)    Result Date: 02/20/2017  IMPRESSION: Contrast CT of the chest showing a large right effusion, which is free-flowing, without evidence of underlying pleural enhancement or nodularity to suggest an exudate by CT. Areas of subsegmental atelectasis as described  above, without evidence of endobronchial lesion or mass. Very significant right atrium and right ventricular enlargement with signs of right heart failure. Associated left atrial enhancement could indicate a combined right and left involvement. ReadingStation:[email or web address]    Mri Brain W Wo Contrast    Result Date: 02/24/2017  Abnormal MRI scan of the head demonstrating: 1. There is an acute cortical infarction of the left operculofrontal region with hemorrhagic features without significant mass effect 2. There is a chronic microhemorrhage in the left thalamus 3. Moderate cortical and subcortical atrophy. 4. Mild chronic microvascular ischemic disease. ReadingStation:[id number]    Xr Chest Ap Portable    Result Date: 02/20/2017  FINDINGS/IMPRESSION: Prominent consolidation of the right middle and right lower lobe, with underlying effusion, possibly partially loculated within the major fissure.. If a CT of the contrast is contemplated, it must be performed with IV contrast to characterize the consolidation and pleural process ReadingStation:[email or web address]    Ct Abdomen Pelvis Wo Iv/ W Po Cont    Result Date: 02/21/2017  1.  There is mild wall thickening along the ascending colon. This is suggestive of colitis. Findings could be infectious, inflammatory or ischemic in etiology. Consider further evaluation with endoscopy following the resolution of acute illness to exclude neoplastic wall thickening. 2.  Heterogeneous right basilar airspace disease is present and there is a small amount of residual right pleural fluid. In the setting of recent thoracentesis, airspace disease is suspected to represent resolving atelectasis. Superimposed consolidation cannot be excluded. 3.  Advanced atherosclerosis. 4.  Fibroid uterus. ReadingStation:[email or web address]    US Thoracentesis    Result Date:  02/21/2017  Successful ultrasound guided drainage catheter insertion and subsequent large volume thoracentesis. ReadingStation:[email or web address]         Physical  Exam:  Vitals:  T:99.1 F (37.3 C) (Oral)   BP:(!) 142/100, HR:(!) 106, RR:16, SaO2:90%  General: Awake, Alert. No Acute Distress.  HEENT:  NCAT, MMM, EOMI, PERRL  Cardiovascular: RRR. No murmurs. Brisk cap refill  Respiratory: CTAB. No rales, rhonchi or retractions.  Abdomen/GI: Nontender, nondistended. Normoactive BS  Integument: Skin intact, no bruises.  Extremities: Expected degrees of motion in all 4 extremities. No Edema.  Neurologic: Some degree of word finding, otherwise No focal neurologic abnormalities or gross focal deficit  Psychiatric: AAOx3, cooperative.       ASSESSMENT AND PLAN:    # Acute ischemic stroke with hemorrhagic conversion. On low-dose aspirin per neurology. Hold eliquis until repeat head CT is done in 3 weeks. Continue Lipitor. PT/OT/ST. Diet per ST.    # Colitis: Unclear etiology. Continue empirical Cipro and Flagyl.    #Hypertension: Continue to hold outpatient medications    #Hypothyroidism: On Synthroid    # Atrial fibrillation: Continue to hold eliquis. Rate controlled without Cardizem or metoprolol.        Signed by:  Marlou Sa, MD  Please contact at phone number [phone] for any questions.

## 2017-02-25 NOTE — OT Eval Note (Signed)
VHS: [facility]  Department of Rehabilitation Services: [phone]    Kristina [name]    CSN#: [id number]  NEURO SURGICAL 439/439-A    Occupational Therapy Evaluation    Consult received for Kristina [name] [place] for OT Evaluation and Treatment.  Patient's medical condition is appropriate for Occupational therapy intervention at this time.    Time of treatment:   Time Calculation  OT Received On: 02/25/17  Start Time: 0945  Stop Time: 1012  Time Calculation (min): 27 min    Visit#: 1    Precautions and Contraindications:   Falls  Mobility protocol   Broca's aphasia  Contact    OT Assessment/Clinical Decision Making:      Kristina [name] is a 83 y.o. female admitted 02/24/2017 presenting with diarrhea and difficulty with speaking- diagnosis with left CVA. resulting in impairments with decreased strength, decreased coordination, balance deficits, decreased problem solving, decreased activity tolerance, decrease safety awareness, fall risk and pain interfered with activity participation. These impairments are affecting the patient's ability to perform in needed/desired occupations resulting in performance deficits in bathing/showering, toileting & toilet hygiene, dressing, functional mobility/transfers, personal hygiene & grooming and meal preparation & cleanup. Would benefit from continued occupational therapy services for above noted impairments.     Rehabilitation Potential: Good With continued OT s/p acute discharge With family     Risks/benefits/POC discussed: Patient, Family    Plan:   Treatment/interventions: ADL retraining, visual perception retraining, functional transfer training, UE strengthening/ROM, cognition (safety awareness, problem solving, etc.), patient/family training, equipment eval/education, neuro muscular re-education, fine motor coordination activities, compensatory technique education    Treatment Frequency: OT Frequency Recommended: 4-5x/wk    Goals:   In 2 to 4  visits:  1. Patient will donn/doff socks with Modified independence  and use of AE/AT prn. NEW  2. Patient will thread pants/undergarments over knees with Modified independence  and use of AE/AT/AD prn. NEW  3. Patient will stand and arrange pants/undergarments over hips with Modified independence  and use of AE/AT/AD prn.  NEW  4. Patient will donn/doff shirt/gown with Modified independence  and use of AT/AE prn. NEW  5. Patient will perform toilet transfer with Supervision and use of AD/DME prn. NEW  6. Patient will perform tub/shower transfer with Supervision and use of AD/DME prn. NEW  7. Patient will perform 1-2 grooming tasks with Supervision and use of AE/AT prn. NEW    LTG (by d/c):   1. Patient will be Modified independence  for ADLs, Modified independence  for functional transfers/mobility with use of AD/DME/AT/AE. 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 ReadingStation:[email or web address]    Ct Head Wo Contrast    Result Date: 02/24/2017  Subarachnoid hemorrhage in left frontal lobe, with surrounding vasogenic edema, loss of gray-white matter differentiation, mild effacement of left lateral ventricle, and 3 mm left-to-right midline shift. This mostly involves the region of  Broca's area, which may explain the patient's difficulty with speech articulation. An underlying mass cannot be excluded. A stat MRI of the brain with  contrast is recommended for further characterization. Findings were discussed with Dr. Nunzio Cory in the emergency room at 12:50pm on the 10th of December, 2018. ReadingStation:[email or web address]    Ct Chest W Contrast (trauma)    Result Date: 02/20/2017  IMPRESSION: Contrast CT of the chest showing a large right effusion, which is free-flowing, without evidence of underlying pleural enhancement or nodularity to suggest an exudate by CT. Areas of subsegmental atelectasis as described above, without evidence of endobronchial lesion or mass. Very significant right atrium and right ventricular enlargement with signs of right heart failure. Associated left atrial enhancement could indicate a combined right and left involvement. ReadingStation:[email or web address]    Mri Brain W Wo Contrast    Result Date: 02/24/2017  Abnormal MRI scan of the head demonstrating: 1. There is an acute cortical infarction of the left operculofrontal region with hemorrhagic features without significant mass effect 2. There is a chronic microhemorrhage in the left thalamus 3. Moderate cortical and subcortical atrophy. 4. Mild chronic microvascular ischemic disease. ReadingStation:[id number]    Xr Chest Ap Portable    Result Date: 02/20/2017  FINDINGS/IMPRESSION: Prominent consolidation of the right middle and right lower lobe, with underlying effusion, possibly partially loculated within the major fissure.. If a CT of the contrast is contemplated, it must be performed with IV contrast to characterize the consolidation and pleural process ReadingStation:[email or web address]    Ct Abdomen Pelvis Wo Iv/ W Po Cont    Result Date: 02/21/2017  1.  There is mild wall thickening along the ascending colon. This is suggestive of colitis. Findings could be infectious, inflammatory or ischemic in etiology. Consider further evaluation with endoscopy following the resolution of acute illness to exclude neoplastic wall thickening. 2.  Heterogeneous right basilar airspace disease is present and  there is a small amount of residual right pleural fluid. In the setting of recent thoracentesis, airspace disease is suspected to represent resolving atelectasis. Superimposed consolidation cannot be excluded. 3.  Advanced atherosclerosis. 4.  Fibroid uterus. ReadingStation:[email or web address]    US Thoracentesis    Result Date: 02/21/2017  Successful ultrasound guided drainage catheter insertion and subsequent large volume thoracentesis. ReadingStation:[email or web address]    Discussed with patient/family/caregiver the patient's physical, cognitive and/or psychosocial history related to current functional performance: yes    Previous therapy services: No    Patient Active Problem List   Diagnosis   . Hematochezia   . PAF (paroxysmal atrial fibrillation)   . HTN (hypertension)   . Hypokalemia   . Pleural effusion, right   . Hypothyroidism   . Chronic systolic (congestive) heart failure   . Cerebrovascular accident (CVA) with intracranial hemorrhage   . Acute CVA (cerebrovascular accident)        Past Medical/Surgical History:  Past Medical History:   Diagnosis Date   . A-fib    . Hypertension    . Melanoma       Past Surgical History:   Procedure Laterality Date   . arm surgery      roght   . HIP SURGERY      left         Occupational Profile:   Home Living Arrangements  Living Arrangements: Children  Assistance Available: Part time  Type of Home: House  Home Layout: Two level, with no stairs to enter, Able to live on main level with bedroom and bathroom  Bathroom:  standard toilet;  tub/shower unit  DME Currently at Home: None    Prior Level of Function  Mobility: Mobility:  Independent with  No assistive device  Fall History: Denies     Activities of Daily Living  Patient was independent with all ADLs    Instrumental Activities of Daily Living  Meal preparation & cleanup: Light meal prep    Work  Retired    [employer]arrister's clerk goal for OT: return to [employer] concerns & priorities: return to [employer]    Subjective:   "I can do  that."   Patient is agreeable to participation in the therapy session. Family and/or guardian are agreeable to patient's participation in the therapy session. Nursing clears patient for therapy.    Pain:  At Rest: 0 /10  With Activity: 0/10  Location: N/A  Interventions: None required    ASSESSMENT OF OCCUPATIONAL PERFORMANCE:   Observation of Patient:    Patient is out of bed, ambulating, with PT with telemetry, peripheral IV            Vital Signs:   Stable with no signs/symptoms of distress  See PT note     Oriented to: Oriented x4  Command following: Follows ALL commands and directions without difficulty  Alertness/Arousal: Appropriate responses to stimuli   Attention Span:Appears intact  Memory: Appears intact  Safety Awareness: minimal verbal instruction  Insights: Decreased awareness of deficits  Problem Solving: minimal assistance  Behavior: Cooperative  Motor planning: Decreased Processing Speed  Coordination: Impaired Fine Motor,    Hand dominance: left handed    Musculoskeletal Examination:   Range of motion:  Bilateral UE: WFL except bilateral shoulder limited- chonic       Strength:  Right UE: Grossly 4/5 push/pull/grip  Left UE: Grossly 3+/5 push/pull/girp         Sensory/Oculomotor Examination:   Auditory:  WFL=intact  Tactile-Light Touch:  WFL=intact  Visual Acuity:  WFL=intact, limited assessment due to aphasia    Tracking:  Able to track stimulus in all quads without difficulty  Visual Fields:  WFL, during grooming tasks    Activities of Daily Living:   Eating:  Supervision/Set up, Simulated;   Grooming: Minimal assist, teeth care, denture care, standing at sink  UB dressing:  Supervision/Set up, Simulated  LB dressing: Minimal assist, don/doff R sock, don/doff L sock, pants simulated, seated in a chair, standing  Bathing:  Minimal assist, Simulated;  Toileting:  Minimal assist, Simulated;         Functional Mobility:     Transfers:  Sit to Stand:  Minimal assist with Front wheeled walker.   Cues  for Hand Placement  Stand to Sit:  Minimal assist.   Cues for Hand Placement  Chair: Minimal assist with Front wheeled walker.   Cues for Hand Placement  Functional mobility/ambulation: Minimal assist with Front wheeled walker.      Balance:   Static Sitting:  Good  Dynamic Sitting:  Good  Static Standing:  Fair  Dynamic Standing:  Fair-    Participation and Activity Tolerance   Participation effort: Good  Activity Tolerance: Tolerates 10-20 minutes of activity without rest breaks    Other Treatment Interventions:   Treatment Activities:   Evaluation          Education Provided:   Topics: Role of occupational therapy, plan of care, goals of therapy and safety with mobility and ADLs, benefits of activity, activity with nursing.    Individuals educated: Patient, Family.  Method: Explanation.  Response to education: Verbalized understanding and Needs reinforcement.    Team Communication:   OT communicated with: RN/LPN -   OT communicated regarding: Pre-session re: patient status, Patient position at end of session, Patient participation with Therapy  OT/COTA communication: via written note and verbal communication as needed.    Sitting, in a chair, Staff present: RN, Needs in reach, Bed/chair alarm set and No distress    Recommend client assist to chair for meals or toilet as needed outside of and in addition to OT session.    Loma Messing, MOTR/L

## 2017-02-25 NOTE — Progress Notes (Signed)
Pt'in afib HR  sustaining in the 140's. Dr Georgianne Fick called. Orders given

## 2017-02-25 NOTE — UM Notes (Signed)
[facility] Utilization Management Review Sheet    Facility :  [facility]    NAME: Kristina [name]  MR#: [id number]    CSN#: [id number]    ROOM: 439/439-A AGE: 83 y.o.    ADMIT DATE AND TIME: 02/24/2017  9:55 AM    PATIENT CLASS: Inpatient 02/24/2017 @ 1619, signed    ATTENDING PHYSICIAN: Marlou Sa, MD      PAYOR:Payor: MEDICARE / Plan: MEDICARE PART A AND B / Product Type: Medicare /       AUTH #:     DIAGNOSIS:     ICD-10-CM    1. Cerebrovascular accident (CVA), unspecified mechanism I63.9    2. Speech articulation disorder F80.0    3. Colitis K52.9    4. Hypertension, unspecified type I10        HPI: "Kristina [name] is a 83 y.o. female who presents to the hospital with  Main problem of dysarthria and  persistent diarrhea.  This a patient that was discharged from this facility on 02/21/2017, at that time she was admitted for abdominal pain with bloody diarrhea, CT scan showed ascending colitis so she was discharged on antibiotics and she was asked to stop eliquis for 3 days   Patient said that diarrhea has gotten better but has not gone away completely, yesterday she had 2 bowel movements that were nonbloody, today in the morning the daughter noticed that the patient was having slurred speech and sometimes problem getting the words out so the patient was brought to the ER, CAT scan showed initial concern for intracranial bleeding but then MRI showed acute CVA on the left operculum with hemorrhagic conversion, neurology had been consulted and they already saw the patient. Vital signs are stable so was called for admission.  I discussed the case with Dr. Nunzio Cory neurologist on call who said that the patient would be okay to be admitted to the floor as this is a primarily ischemic event and the patient is a stable" per Dr. Marcos Eke' H&P      ED Vitals & Treatment:  02/24/17 0944 97.8 F (36.6 C) Tympanic 93 95 % 18 154/85 - 162/103     MEDS: NS IV bolus x1, IV contrast for imaging, NS @ 44ml/hr,  IV cipro q12h, IV flagyl q8h    MRI brain:  Impression:    Abnormal MRI scan of the head demonstrating:  1. There is an acute cortical infarction of the left operculofrontal region with hemorrhagic features without significant mass effect            ASSESSMENT & PLAN    Medicine note:  Principal Problem:    Cerebrovascular accident (CVA) with intracranial hemorrhage   discussed with neurologist, plan is o admit to medicine, neuro floor, NP already saw pt  No OAC and no ASA for now, CT will be repeated tomorrow by neuro    Active Problems:    PAF (paroxysmal atrial fibrillation)  Cont to hold eliquis due to hemorrhagic conversion  On d/c will likely require just ASA but will let neuro decide  Hold bb and cardizem for 24h , hopefulle HR will remain ok otherwise will have to push prn metoprolol      HTN (hypertension)  Hold BP meds including metoproll and cardziem for now      Hypothyroidism  Stable, cont synthroid      Chronic systolic (congestive) heart failure  Recent ef 35 to 40%, no acute exacerbation, watch for overload  Acute colitis  Still recovering but has improved  Cotntinue  ATB, will choose cipro/flagyl iv for now      Neurology consult:  Patient was previously on Eliquis for a fib, but this was stopped during hospitalization last week for rectal bleed. DOAC stopped 02/21/17. Plan was to restart today, until imaging reveal hemorrhage.   ECHO performed 02/21/17 shows 35-40% EF, severe tricuspid regurgitation, moderate to severe pulmonic insufficiency, as well as other mild to moderate abnormalities.   ASSESSMENT AND PLAN:   Kristina [name], 83 year old female initially brought to hospital with diarrhea and dehydration, had imaging for expressive aphasia, found to have cerebral infarction with hemorrhagic conversion.     1. Diagnostics:  1. CT head reported as left frontal subarachnoid hemorrhage with edema and midline shift.  2. MRI brain reports left frontal cerebral infarction with hemorrhagic  conversion.   3. ECHO completed 02/21/17. No need to repeat.   4. Will discuss with Dr. Nunzio Cory CDUS vs. CTA vs. MRA  5. Neuro checks and vital signs q4h  6. Telemetry.  2. Labs:   1. Stroke labs. Hgba1c, lipid panel, TSH, free T4, ESR, CRP, B12, folate pending  3. Medications:   1. Patient not candidate for tPA due to delay of presentation. LKW was prior to bed the night before.   2. Patient on Eliquis prior to rectal bleed last week. Recommendation would be to hold eliquis at this time, repeat CT outpatient in 3 weeks.   1. Discussed with pharmacist. Consider baby aspirin in interim.  3. Atorvastatin 80mg .   4. Rehabilitation services:   1. SLP  2. PT  3. OT        Admit Inpatient: Continuous telemetry monitoring, vital signs and neuro checks Q4 hours, NIHSS assessment daily, aspiration precautions, echocardiogram, BP management (SBP goal 160), strict I&O qShift, nursing dysphagia screen, PT/OT/SLP eval and treat, pain management, falls precautions, SCDs for DVT prophylaxis.         Kristina Dan, RN BSN  Utilization Review Nurse  Utilization Management  [facility]  [address]  [place], [place] [address]  Phone: [phone], direct/confidential  Fax: [phone]  [email or web address]

## 2017-02-25 NOTE — Progress Notes (Signed)
Stool culture obtained and sent

## 2017-02-25 NOTE — Progress Notes (Signed)
[facility]  [address]  [place], [place] [address]  [facility]: [phone]   [facility]: [phone]    NEUROLOGY PROGRESS NOTE    Date / Time: 02/25/17 8:23 AM  Patient Name: [name]  Attending Physician: Marlou Sa, MD      CHIEF COMPLAINT:     Chief Complaint   Patient presents with   . Diarrhea       HISTORY OF PRESENT ILLNESS:   Enedelia Martorelli ION:[id number], MRN: [id number], is a 83 y.o. female with history of atrial fibrillation, hypothyroidIism, HTN, brought to ED for diarrhea and dehydration. Patient and family also reported difficulty speaking that was noticed when patient woke up around 7AM.     CT head initially read as left frontal lobe subarachnoid hemorrhage with edema and midline shift.   MRI brain was performed to further evaluate, and it was determined that abnormality was actually ischemic infarct with hemorrhagic conversion.     Patient was previously on Eliquis for a fib, but this was stopped during hospitalization last week for rectal bleed. DOAC stopped 02/21/17. Plan was to restart 12/10, until imaging revealed hemorrhage.     ECHO performed 02/21/17 shows 35-40% EF, severe tricuspid regurgitation, moderate to severe pulmonic insufficiency, as well as other mild to moderate abnormalities.     12/11-No neuro changes overnight. Continues to have aphasia.     REVIEW OF SYSTEMS:   Patient with expressive aphasia, unable to provide ROS.      CURRENT PROBLEM LIST:     Active Hospital Problems    Diagnosis   . Hypothyroidism   . Chronic systolic (congestive) heart failure   . Cerebrovascular accident (CVA) with intracranial hemorrhage   . Acute CVA (cerebrovascular accident)   . PAF (paroxysmal atrial fibrillation)   . HTN (hypertension)       PAST MEDICAL HISTORY:     Past Medical History:   Diagnosis Date   . A-fib    . Hypertension    . Melanoma        FAMILY HISTORY:   History reviewed. No pertinent family history.     SOCIAL HISTORY:     Social History     Social  History   . Marital status: Widowed     Spouse name: N/A   . Number of children: N/A   . Years of education: N/A     Social History Main Topics   . Smoking status: Never Smoker   . Smokeless tobacco: Never Used   . Alcohol use Yes   . Drug use: No   . Sexual activity: Not Asked     Other Topics Concern   . None     Social History Narrative   . None       ALLERGIES:     Allergies   Allergen Reactions   . Lisinopril Anaphylaxis       MEDICATIONS:     Prior to Admission medications    Medication Sig Start Date End Date Taking? Authorizing Provider   amoxicillin-clavulanate (AUGMENTIN) 875-125 MG per tablet Take 1 tablet by mouth 2 (two) times daily.for 6 days 02/21/17 02/27/17 Yes Theda Sers, MD   dilTIAZem (CARDIZEM CD) 240 MG 24 hr capsule Take 240 mg by mouth every morning.     01/31/17  Yes [provider]   folic acid (FOLVITE) 1 MG tablet Take 1 mg by mouth daily.   Yes [provider]   furosemide (LASIX) 20 MG tablet Take 1 tablet (20  mg total) by mouth daily.for 7 days 02/21/17 02/28/17 Yes Theda Sers, MD   lactase (LACTAID) 3000 units tablet Take 1 tablet by mouth 3 (three) times daily with meals.   Yes [provider]   lactobacillus species (BIO-K PLUS) capsule Take 1 capsule (50 Billion CFU total) by mouth daily. 02/22/17 03/24/17 Yes Theda Sers, MD   latanoprost (XALATAN) 0.005 % ophthalmic solution Place 1 drop into both eyes nightly.       Yes [provider]   levothyroxine (SYNTHROID, LEVOTHROID) 112 MCG tablet Take 112 mcg by mouth every morning.     01/20/17  Yes [provider]   LUMIGAN 0.01 % Solution Place 1 drop into both eyes nightly. 01/31/17  Yes [provider]   metoprolol tartrate (LOPRESSOR) 25 MG tablet Take 25 mg by mouth 2 (two) times daily.     01/31/17  Yes [provider]   potassium chloride (K-TAB, KLOR-CON) 10 MEQ tablet Take 2 tablets (20 mEq total) by mouth daily.for 7 days 02/21/17 02/28/17 Yes Theda Sers, MD    [facility] 1-0.2 % Suspension Place 1 drop into both eyes every morning. 01/31/17  Yes [provider]   fluticasone (FLONASE) 50 MCG/ACT nasal spray 1 spray by Nasal route daily as needed for Rhinitis or Allergies.        [provider]   methotrexate 2.5 MG tablet Take 10 mg by mouth Once a week on Monday evening.     01/21/17   [provider]   nitroglycerin (NITROSTAT) 0.4 MG SL tablet Place 0.4 mg under the tongue every 5 (five) minutes as needed for Chest pain.     01/31/17   [provider]   PROAIR HFA 108 (90 Base) MCG/ACT inhaler Inhale 2 puffs into the lungs 2 (two) times daily as needed for Wheezing or Shortness of Breath.     12/31/16   [provider]               PHYSICAL EXAM:     Vitals:    02/25/17 0817   BP:    Pulse: (!) 106   Resp:    Temp:    SpO2:        General: Patient is a 83 year old female, in no acute distress.   Mental status: Awake, alert, pleasantly confused. Continues to have significant expressive and receptive aphasia.  Cranial Nerve Exam:  CN 2 Normal vision without visual field defects   CN 3,4,6 EOMs intact without nystagmus, PERRLA  CN 5 Facial sensation is symmetrical.  CN 7 Spontaneous facial expressions are symmetrical.  CN 8 Auditory acuity intact bilaterally to casual conversation.  CN 9 Palate rises with phonation.  CN 11 Trapezius and sternocleidomastoids intact.  CN 12 Tongue protrudes in midline.  Motor: Power is 5/5 in upper extremities, 4+/5 lower extremities. With normal tone and bulk for age.   Sensory:  Sensation intact to light touch throughout.   Coordination: Patient does not follow commands for rapid alternating movements.   DTRs: 2+ and symmetric bilaterally  Gait: not tested    HEENT:  Head is midline, normocephalic, atraumatic.   Cardiac: irregularly irregular rhythm  Chest:  Breath sounds CTA bilaterally.  Abd:  Bowel sounds normoactive in all quadrants.    Extremities:  No deformities, cyanosis, clubbing or  edema. No new rash or bruise.    SIGNIFICANT LABS:     Results     Procedure Component Value Units Date/Time  Basic Metabolic Panel [[id number]]  (Abnormal) Collected:  02/25/17 0344    Specimen:  Plasma Updated:  02/25/17 0545     Sodium 136 mMol/L      Potassium 3.5 mMol/L      Chloride 106 mMol/L      CO2 20.9 mMol/L      Calcium 8.7 mg/dL      Glucose 92 mg/dL      Creatinine 5.64 mg/dL      BUN 14 mg/dL      Anion Gap 33.2 mMol/L      BUN/Creatinine Ratio 20.6 Ratio      EGFR 80 mL/min/1.31m2      Osmolality Calc 272 (L) mOsm/kg     Lipid panel (Fasting) [[id number]]  (Abnormal) Collected:  02/25/17 0344    Specimen:  Plasma Updated:  02/25/17 0545     Cholesterol 137 mg/dL      Triglycerides 90 mg/dL      HDL 33 (L) mg/dL      LDL Calculated 86 mg/dL      Coronary Heart Disease Risk 4.15     VLDL 18    CBC and differential [[id number]]  (Abnormal) Collected:  02/25/17 0344    Specimen:  Blood from Blood Updated:  02/25/17 0506     WBC 7.8 K/cmm      RBC 3.90 M/cmm      Hemoglobin 13.9 gm/dL      Hematocrit 93.2 %      MCV 106 (H) fL      MCH 36 (H) pg      MCHC 33 gm/dL      RDW 35.5 %      PLT CT 179 K/cmm      MPV 8.2 fL      NEUTROPHIL % 69.9 %      Lymphocytes 19.1 %      Monocytes 10.0 %      Eosinophils % 0.5 %      Basophils % 0.5 %      Neutrophils Absolute 5.4 K/cmm      Lymphocytes Absolute 1.5 K/cmm      Monocytes Absolute 0.8 K/cmm      Eosinophils Absolute 0.0 K/cmm      BASO Absolute 0.0 K/cmm     Hemoglobin A1C [[id number]] Collected:  02/24/17 1648    Specimen:  Blood Updated:  02/24/17 2149     Hgb A1C, % 5.7 %     T4, free [[id number]] Collected:  02/24/17 1019    Specimen:  Plasma Updated:  02/24/17 1815     T4 Free 1.12 ng/dL     Vitamin B15 And Folate [[id number]] Collected:  02/24/17 1019    Specimen:  Plasma Updated:  02/24/17 1757     Folate 16.8 ng/mL      Vitamin B-12 493 pg/mL     TSH [[id number]]  (Abnormal) Collected:  02/24/17 1019    Specimen:  Plasma Updated:  02/24/17 1741     TSH  5.94 (H) uIU/mL     C-Reactive Protein [[id number]] Collected:  02/24/17 1019    Specimen:  Plasma Updated:  02/24/17 1724     C-Reactive Protein 0.72 mg/dL     Sedimentation rate (ESR) [[id number]] Collected:  02/24/17 1648    Specimen:  Blood Updated:  02/24/17 1700     Sed Rate 11 mm/hr     CBC [[id number]]  (Abnormal) Collected:  02/24/17 1019    Specimen:  Blood from Blood Updated:  02/24/17 1110  WBC 9.1 K/cmm      RBC 4.46 M/cmm      Hemoglobin 15.2 gm/dL      Hematocrit 16.1 %      MCV 107 (H) fL      MCH 34 pg      MCHC 32 gm/dL      RDW 09.6 (H) %      PLT CT 181 K/cmm      MPV 8.0 fL      NEUTROPHIL % 64.6 %      Lymphocytes 27.0 %      Monocytes 7.0 %      Eosinophils % 0.6 %      Basophils % 0.8 %      Neutrophils Absolute 5.9 K/cmm      Lymphocytes Absolute 2.5 K/cmm      Monocytes Absolute 0.6 K/cmm      Eosinophils Absolute 0.1 K/cmm      BASO Absolute 0.1 K/cmm      RBC Morphology RBC Morphology Reviewed     Macrocytic 1+     Anisocytosis 1+    CMP [[id number]]  (Abnormal) Collected:  02/24/17 1019    Specimen:  Plasma Updated:  02/24/17 1049     Sodium 139 mMol/L      Potassium 4.3 mMol/L      Chloride 106 mMol/L      CO2 19.9 (L) mMol/L      Calcium 9.8 mg/dL      Glucose 95 mg/dL      Creatinine 9.14 mg/dL      BUN 20 mg/dL      Protein, Total 6.9 gm/dL      Albumin 4.1 gm/dL      Alkaline Phosphatase 81 U/L      ALT 16 U/L      AST (SGOT) 38 U/L      Bilirubin, Total 1.0 mg/dL      Albumin/Globulin Ratio 1.46 Ratio      Anion Gap 17.4 mMol/L      BUN/Creatinine Ratio 24.4 Ratio      EGFR 65 mL/min/1.34m2      Osmolality Calc 280 mOsm/kg      Globulin 2.8 gm/dL     Lipase [[id number]] Collected:  02/24/17 1019    Specimen:  Plasma Updated:  02/24/17 1049     Lipase 12 U/L           SIGNIFICANT IMAGING:     MRI Brain W WO Contrast   Final Result   Abnormal MRI scan of the head demonstrating:   1. There is an acute cortical infarction of the left operculofrontal region with hemorrhagic features  without significant mass effect   2. There is a chronic microhemorrhage in the left thalamus   3. Moderate cortical and subcortical atrophy.   4. Mild chronic microvascular ischemic disease.      ReadingStation:[id number]      CT Head WO Contrast   Final Result   Subarachnoid hemorrhage in left frontal lobe, with surrounding vasogenic edema, loss of gray-white matter differentiation, mild effacement of left lateral ventricle, and 3 mm left-to-right midline shift. This mostly involves the region of Broca's area,    which may explain the patient's difficulty with speech articulation. An underlying mass cannot be excluded. A stat MRI of the brain with contrast is recommended for further characterization.      Findings were discussed with Dr. Nunzio Cory in the emergency room at 12:50pm on the 10th of December, 2018.  ReadingStation:[email or web address]      MR Angiogram Neck W WO Contrast    (Results Pending)           ASSESSMENT AND PLAN:   TANYIAH LAURICH, 83 year old female initially brought to hospital with diarrhea and dehydration, had imaging for expressive aphasia, found to have cerebral infarction with hemorrhagic conversion.     1. Diagnostics:  1. MRI brain reports left frontal cerebral infarction with hemorrhagic conversion.   2. ECHO completed 02/21/17. No need to repeat.   3. CTA head/neck pending  4. Neuro checks and vital signs q4h  5. Telemetry.  6. Repeat CT head 3 weeks  2. Labs:   1. Cholesterol 137, HDL 33, LDL 86, TG 90, B12 493, folate 16.8, HA1c 5.7  3. Medications:   1. Hold Eliquis until repeat CT head in 3 weeks. Continue low dose ASA.  2. Atorvastatin 80 mg.   4. Rehabilitation services:   1. PT/OT  2. SLP    She will need to follow up with neurology in 4 weeks following CT head to determine if safe to restart Eliquis.     Case discussed with Dr. Nunzio Cory who will additionally review diagnostics/labs, personally evaluate the patient, further determine the treatment plan, and amend this note as indicated.      Cala Bradford, PA-C  02/25/2017  8:23 AM  [phone]    I have reviewed the notes, assessments, and/or procedures performed by PA Julien Girt, I concur with her/his documentation of Idamae R Gul.

## 2017-02-25 NOTE — PT Eval Note (Signed)
[facility]: [facility]  Department of Rehabilitation Services: [phone]  Kristina [name]    CSN: [id number]    NEURO SURGICAL   [id number]    Physical Therapy Evaluation    Time of treatment:  Time Calculation  PT Received On: 02/25/17  Start Time: 0907  Stop Time: 0925  Time Calculation (min): 18 min    Visit#: 1                                                                                 Precautions and Contraindications:   Falls  Mobility protocol       Clinical Presentation and Decision Making     PT Assessment:  Kristina [name] was admitted 02/24/2017 with dysarthria and persistent diarrhea with imaging revealing ischemic infarct with hemorrhagic conversion.  Patient now with Broca's aphasia.    At baseline, patient is very active, and independent.  Family confirms that she ambulates independently and will take their dogs on hikes daily (about 1 mile in length).  At evaluation, patient is unsteady and requires min assist for transfers and ambulation.  No asymmetrical strength deficits noted, but balance is impaired and patient now requires an assistive device for safe ambulation.  Recommending acute rehab before transitioning home with part time assist.     Patient presenting with the following PT Impairments:decreased activity tolerance, decreased functional mobility, decreased balance, gait deficits    Patient will benefit from skilled PT services in order to maximize functional independence.      Due to the presence of several treatment options and 1-2 comorbidities or personal factors that affect performance, as well as patient's stable and/or uncomplicated characteristics, minimal to moderate modifications of mobility and/or assistance were necessary to complete evaluation when examining total of 3 elements (includes body structures and functions, activity limitations and/or participation restriction) determines the degree of complexity for this patient is LOW    Rehabilitation  Potential:Good with ongoing PT upon discharge from acute care.  24 hour supervision recommended.    Discussed risk, benefits and Plan of Care with: Patient, Family    DISCHARGE RECOMMENDATIONS   DME recommended for Discharge:   TBD at next level of care    Discharge Recommendations:   Acute Rehab   Pending medical progress  Pending progress in next therapy session    Development of Plan of Care:     Goals:    LTG (by discharge)   1.  Patient will perform bed mobility with independence assistance in prep for out of bed activity. NEW  2.  Patient will perform sit to stand transfers with front wheeled walker and mod I assistance in prep for ambulation. NEW  3.  Patient will ambulate 150 feet with front wheeled walker and mod I assistance for household/community ambulation. 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 female admitted on 02/24/2017 with above chief complaints.     Patient Active Problem List   Diagnosis   . Hematochezia   . PAF (paroxysmal atrial fibrillation)   . HTN (hypertension)   . Hypokalemia   . Pleural effusion, right   . Hypothyroidism   . Chronic systolic (congestive) heart failure   . Cerebrovascular accident (CVA) with intracranial hemorrhage   . Acute CVA (cerebrovascular accident)        X-Rays/Tests/Labs:  Ct Angiogram Head    Result Date: 02/25/2017  Normal CTA head. ReadingStation:[id number]    Xr Chest Pa Or Ap    Result Date:  02/21/2017  No pneumothorax status post right-sided thoracentesis. ReadingStation:[email or web address]    Ct Head Wo Contrast    Result Date: 02/24/2017  Subarachnoid hemorrhage in left frontal lobe, with surrounding vasogenic edema, loss of gray-white matter differentiation, mild effacement of left lateral ventricle, and 3 mm left-to-right midline shift. This mostly involves the region of Broca's area, which may explain the patient's difficulty with speech articulation. An underlying mass cannot be excluded. A stat MRI of the brain with contrast is recommended for further characterization. Findings were discussed with Dr. Nunzio Cory in the emergency room at 12:50pm on the 10th of December, 2018. ReadingStation:[email or web address]    Ct Angiogram Neck    Result Date: 02/25/2017  Abnormal CT angiogram of the neck demonstrating: 1. On the right the internal carotid is moderate stenosis of approximately 60%. The right vertebral artery is patent. 2. On the left there is mild narrowing the proximal internal carotid artery. The left vertebral artery is patent. ReadingStation:[id number]    Ct Chest W Contrast (trauma)    Result Date: 02/20/2017  IMPRESSION: Contrast CT of the chest showing a large right effusion, which is free-flowing, without evidence of underlying pleural enhancement or nodularity to suggest an exudate by CT. Areas of subsegmental atelectasis as described above, without evidence of endobronchial lesion or mass. Very significant right atrium and right ventricular enlargement with signs of right heart failure. Associated left atrial enhancement could indicate a combined right and left involvement. ReadingStation:[email or web address]    Mri Brain W Wo Contrast    Result Date: 02/24/2017  Abnormal MRI scan of the head demonstrating: 1. There is an acute cortical infarction of the left operculofrontal region with hemorrhagic features without significant mass effect 2. There is a chronic microhemorrhage in the left thalamus 3. Moderate cortical and  subcortical atrophy. 4. Mild chronic microvascular ischemic disease. ReadingStation:[id number]    Xr Chest Ap Portable    Result Date: 02/20/2017  FINDINGS/IMPRESSION: Prominent consolidation of the right middle and right lower lobe, with underlying effusion, possibly partially loculated within the major fissure.. If a CT of the contrast is contemplated, it must be performed with IV contrast to characterize the consolidation and pleural process ReadingStation:[email or web address]    Ct Abdomen Pelvis Wo Iv/ W Po Cont    Result Date: 02/21/2017  1.  There is mild wall thickening along the ascending colon. This is suggestive of colitis. Findings could be infectious, inflammatory or ischemic in etiology. Consider further evaluation with endoscopy following the resolution of acute illness to exclude neoplastic wall thickening. 2.  Heterogeneous right basilar airspace disease is present and there is a small amount of residual right pleural fluid. In the setting of recent thoracentesis, airspace disease is suspected to represent resolving atelectasis. Superimposed consolidation cannot be excluded.  3.  Advanced atherosclerosis. 4.  Fibroid uterus. ReadingStation:[email or web address]    US Thoracentesis    Result Date: 02/21/2017  Successful ultrasound guided drainage catheter insertion and subsequent large volume thoracentesis. ReadingStation:[email or web address]        Past Medical/Surgical History:  Past Medical History:   Diagnosis Date   . A-fib    . Hypertension    . Melanoma       Past Surgical History:   Procedure Laterality Date   . arm surgery      roght   . HIP SURGERY      left         Social History:    Home Living Arrangements:  Living Arrangements: Children (daughter)  Assistance Available: Part time, daughter works during the day  Type of Home: House  Home Layout: Two level, with no stairs to enter, Able to live on main level with bedroom and bathroom    Prior Level of Function:  Community ambulation  Mobility:  Independent with  No assistive  device  Fall history: none    DME available at home:  None    Subjective      Patient is agreeable to participation in the therapy session. Nursing clears patient for therapy.    Patient/caregiver goal for PT: not stated    Pain:  At Rest: 0/10  With Activity: 0/10  Location: N/A  Interventions: None required    Examination of Body Systems (Structures, Function, Activity and Participation)   Patient's medical condition is appropriate for Physical therapy intervention at this time    Observation of patient:  Patient is in bed with telemetry, peripheral IV line     Cognition:  Oriented to: Oriented x4    Vital Signs (Cardiovascular):  See flowsheet between start time and stop times for vitals taken during session     Sensation: intact  to bilateral lower extremities    Balance:  Static Sitting:  Good  Dynamic Sitting:  Good  Static Standing:  Fair-  Dynamic Standing:  Fair-            Musculoskeletal Examination:            Range of motion:  Right LE: Grossly WFL  Left LE: Grossly WFL       Strength:  Right LE: Grossly 4+5  Left LE: Grossly 4+?5      Tone:  Normal     Functional Mobility:    Bed Mobility:  Not tested due to patient already OOB.    Transfers:  Sit to Stand:  Minimal assist with Front wheeled walker.    Cues for Sequencing, Cues for Hand Placement  Stand to Sit:  Minimal assist.    Cues for Sequencing, Cues for Hand Placement    Locomotion:  LEVEL AMBULATION:  Distance: x40 feet   Assistance level:  Minimal assist  Device:  Front wheeled walker  Pattern:  Decreased cadence, min assist for balance     AM-PACT "6 Clicks" Basic Mobility Inpatient Short Form  Turning Over in Bed: None  Sitting Down On/Standing From Armchair: A little  Lying on Back to Sitting on Side of Bed: A little  Assist Moving to/from Bed to Chair: A little  Assist to Walk in Hospital Room: A little  Assist to Climb 3-5 Steps with Railing: A little  PT Basic Mobility Raw Score: 19  CMS 0-100% Score: 41.77% Mobility G Code  Set  Mobility, Current Status (W0981): At least 40 percent but less than 60  percent impaired, limited or restricted  Mobility, Goal Status (Z6109): At least 20 percent but less than 40 percent impaired, limited or restricted               Participation and Activity Tolerance:  Participation effort: Good  Activity Tolerance: Tolerates 10-20 minutes of activity with multiple rests    Treatment Interventions this session:   Evaluation    Education Provided:   TOPICS: role of physical therapy, plan of care, goals of therapy and HEP, safety with mobility and ADLs, benefits of activity, home safety, use of adaptive equipment, bed mobility with use of adaptive equipment or strategy, activity with nursing     Learner educated: Patient  Method: Explanation  Response to education: Verbalized understanding    Patient Position at End of Treatment:   Supine, in bed, Needs in reach, Bed/chair alarm set and No distress    Team Communication:     Spoke to: RN/LPN - Kristen  Regarding: Patient participation with Therapy  Whiteboard updated: No  PT/PTA communication: via written note and verbal communication as needed.      Recommend patient ambulate as tolerated with assist outside of PT sessions.    Sydell Axon, PT, DPT

## 2017-02-25 NOTE — Progress Notes (Signed)
[facility]   [address]   [place] [address]     Case Manager    Patient identified as a Moderate Risk for readmission based on the current risk score  Estimated D/C Date: Not provided   Risk Score: 11%   RX Coverage:   yes   Utilize D/C Medication Program: yes   Confirmed PCP with patient: name/last visit: No PCP/had arranged appt at [facility] last adm   Confirmed Transportation to F/u Appt: Yes-daughter   PCP F/U Appt request is placed in the Navigator: Transition clinic liaison aware of pt admission-anticipate rehab   Anticipated DME needed for D/C: Anticipate rehab   Anticipated HH, arrange if appropriate: Anticipate rehab   Anticipated Placement:  Referral to DCP: Yes  yes   Inpatient Plan of Care:    Adm diarrhea/rectal bleeding-recent adm for colitis-IV abx; Dysarthria; MRI +infarct w/hemorrhagic conversion; Neuro consult-cont low dose ASA,Eliquis on hold; CTA head/neck pding; PT/OT UU:VOZD was on Eliquis,HTN   CM Interventions:    Reviewed chart and met with pt who was confused, spoke w/[name] via phone [phone]) Pt lives with daughter, recently moved here from [place]; No PCP-had appt with [facility] arranged last adm; PT rec Acute rehab-daughter agreeable; Referral to DCP          02/25/17 1312   Patient Type   Within 30 Days of Previous Admission? Yes   Healthcare Decisions   Interviewed: Family  (spoke with daughter via phone as pt unable to tell me name or where she is currently)   Orientation/Decision Making Abilities of Patient Confused, unable to make decisions   Prior to admission   Prior level of function Independent with ADLs;Ambulates independently   Type of Residence Private residence   Home Layout Two level;Able to live on main level with bedroom/bathroom  (no steps to enter)   Living Arrangements Children  (daughter and grandson)   How do you get to your MD appointments? daughter   How do you get your groceries? daughter   Who fixes your meals? daughter/self   Who does  your laundry? daughter   Who picks up your prescriptions? daughter   Dressing Independent   Grooming Independent   Feeding Independent   Bathing Independent   Toileting Independent   DME Currently at Home Other (Comment)  (None)   Home Care/Community Services None   Discharge Planning   Support Systems Children   Anticipated Gas plan discussed with: Same as interviewed   Mode of transportation: Private car (family member)   Consults/Providers   Correct PCP listed in Epic? Yes  (No PCP, had appt scheduled at [facility] for today from last adm)        02/25/17 1320   Readmission Patient Interview/Contributing Factors   D/C Instructions written, given to you? Yes   D/C Instructions easy to read? Yes   Post D/C Follow Up Made? Yes   When did you go in for D/C follow-up? admitted prior to f/u appt   Pt can tell why here/diagnosis pt cannot tell me, confused   Contributing Factors to Readmission New illness or injury not related to previous diagnosis   Patient active with Home Health? No, not house-bound previously   Patient active with home hospice? No   Was patient readmitted from a facility? Not readmitted from a facility     Marko Stai, RN, BSN  Nurse Case Manager-[facility]  [phone]

## 2017-02-26 ENCOUNTER — Inpatient Hospital Stay: Payer: Medicare Other

## 2017-02-26 DIAGNOSIS — M25561 Pain in right knee: Secondary | ICD-10-CM

## 2017-02-26 MED ORDER — LOPERAMIDE HCL 1 MG/5ML PO LIQD
1.00 mg | Freq: Four times a day (QID) | ORAL | Status: DC | PRN
Start: 2017-02-26 — End: 2017-03-04
  Administered 2017-03-02 – 2017-03-03 (×2): 1 mg via ORAL
  Filled 2017-02-26 (×4): qty 10

## 2017-02-26 MED ORDER — DILTIAZEM HCL ER COATED BEADS 240 MG PO CP24
240.00 mg | ORAL_CAPSULE | Freq: Every day | ORAL | Status: DC
Start: 2017-02-26 — End: 2017-03-01
  Administered 2017-02-26 – 2017-02-28 (×3): 240 mg via ORAL
  Filled 2017-02-26 (×4): qty 1

## 2017-02-26 MED ORDER — METOPROLOL TARTRATE 25 MG PO TABS
25.00 mg | ORAL_TABLET | Freq: Two times a day (BID) | ORAL | Status: DC
Start: 2017-02-26 — End: 2017-02-28
  Administered 2017-02-26 – 2017-02-27 (×4): 25 mg via ORAL
  Filled 2017-02-26 (×6): qty 1

## 2017-02-26 NOTE — Plan of Care (Signed)
Problem: Every Day - Stroke  Goal: Stable vital signs and fluid balance  Outcome: Progressing   02/26/17 2313   Goal/Interventions addressed this shift   Stable vital signs and fluid balance  Position patient for maximum circulation/cardiac output;Monitor and assess vitals every 4 hours or as ordered and hemodynamic parameters;Monitor intake and output. Notify LIP if urine output is < 30 mL/hour.;Encourage oral fluid intake;Apply telemetry monitor as ordered     Goal: Mobility/Activity is maintained at optimal level for patient  Outcome: Progressing   02/26/17 2313   Goal/Interventions addressed this shift   Mobility/activity is maintained at optimal level for patient Increase mobility as tolerated/progressive mobility;Encourage independent activity per ability;Perform active/passive ROM;Maintain proper body alignment;Plan activities to conserve energy, plan rest periods;Reposition patient every 2 hours and as needed unless able to reposition self;Assess for changes in respiratory status, level of consciousness and/or development of fatigue;Consult/collaborate with Physical Therapy and/or Occupational Therapy

## 2017-02-26 NOTE — Plan of Care (Signed)
Problem: Every Day - Stroke  Goal: Stable vital signs and fluid balance  Outcome: Progressing   02/26/17 0105   Goal/Interventions addressed this shift   Stable vital signs and fluid balance  Position patient for maximum circulation/cardiac output;Monitor and assess vitals every 4 hours or as ordered and hemodynamic parameters;Encourage oral fluid intake;Apply telemetry monitor as ordered

## 2017-02-26 NOTE — Consults (Signed)
[facility]    Consultation        Demographics:     Date and Time: 02/27/2017 8:36 AM  Patient Name: BAYLEY, YARBOROUGH  DOB: [birth year]  Age: 83 y.o.   PCP: Pcp, Notonfile, MD      History provided by EMR    History limited by patient acute CVA      History of Present Illness:     Patient is a 83 y.o. female admitted for stroke after being off anticoagulation that orthopedics is consulted to see regarding right knee pain. The patient is unable to give any history as the CVA she has had is limiting her ability to articulate (Brocas aphasia). Per MD notes, patient was having difficulty moving her right lower extremity on the evening of 02/25/17. X-rays done on 02/26/17 concerning for tibial plateau fracture. CT ordered showing chronic compression deformity without definite fracture in the lateral compartment of the right knee. Patient says yes when asked if her knee hurts, but has difficulty providing any history regarding the pain.      Past Medical History:     Past Medical History:   Diagnosis Date   . A-fib    . Hypertension    . Melanoma         Past Surgical History:   Procedure Laterality Date   . arm surgery      roght   . HIP SURGERY      left        Prescriptions Prior to Admission   Medication Sig Dispense Refill Last Dose   . amoxicillin-clavulanate (AUGMENTIN) 875-125 MG per tablet Take 1 tablet by mouth 2 (two) times daily.for 6 days 12 tablet 0 02/23/2017 at 1700   . dilTIAZem (CARDIZEM CD) 240 MG 24 hr capsule Take 240 mg by mouth every morning.       02/23/2017 at 0700   . folic acid (FOLVITE) 1 MG tablet Take 1 mg by mouth daily.   02/23/2017 at 0700   . furosemide (LASIX) 20 MG tablet Take 1 tablet (20 mg total) by mouth daily.for 7 days 7 tablet 0 02/23/2017 at 0700   . lactase (LACTAID) 3000 units tablet Take 1 tablet by mouth 3 (three) times daily with meals.   02/23/2017 at 1700   . lactobacillus species (BIO-K PLUS) capsule Take 1 capsule (50 Billion CFU total) by mouth daily.  30 capsule 0 02/23/2017 at 2000   . latanoprost (XALATAN) 0.005 % ophthalmic solution Place 1 drop into both eyes nightly.       02/23/2017 at 1900   . levothyroxine (SYNTHROID, LEVOTHROID) 112 MCG tablet Take 112 mcg by mouth every morning.       02/23/2017 at 0600   . LUMIGAN 0.01 % Solution Place 1 drop into both eyes nightly.   02/23/2017 at 1900   . metoprolol tartrate (LOPRESSOR) 25 MG tablet Take 25 mg by mouth 2 (two) times daily.       02/23/2017 at 1800   . potassium chloride (K-TAB, KLOR-CON) 10 MEQ tablet Take 2 tablets (20 mEq total) by mouth daily.for 7 days 14 tablet 0 02/23/2017 at 0700   . SIMBRINZA 1-0.2 % Suspension Place 1 drop into both eyes every morning.   02/23/2017 at 0600   . fluticasone (FLONASE) 50 MCG/ACT nasal spray 1 spray by Nasal route daily as needed for Rhinitis or Allergies.       Unknown at Unknown time   . methotrexate 2.5 MG tablet Take  10 mg by mouth Once a week on Monday evening.       02/17/2017   . nitroglycerin (NITROSTAT) 0.4 MG SL tablet Place 0.4 mg under the tongue every 5 (five) minutes as needed for Chest pain.       Unknown at Unknown time   . PROAIR HFA 108 (90 Base) MCG/ACT inhaler Inhale 2 puffs into the lungs 2 (two) times daily as needed for Wheezing or Shortness of Breath.       Unknown at Unknown time       Allergies   Allergen Reactions   . Lisinopril Anaphylaxis        Social History   Substance Use Topics   . Smoking status: Never Smoker   . Smokeless tobacco: Never Used   . Alcohol use Yes        History reviewed. No pertinent family history.       Review of Systems:    OBTAINED FROM EMR  General:  Negative for fever or chills.  HEENT:  Negative for headaches or blurred vision.  Respiratory:  Negative for shortness of breath or cough.  Cardiac:  Negative for chest pain or palpitations.  GI:  Positive of abdominal pain, + bloody diarrhea.   Neurologic:  +right sided weakness, aphasia.  Musculoskeletal:  As per HPI  Skin:  Negative for rashes or lesions.      Remaining review of systems negative.      Physical Examination:      Vital Signs:  BP 118/86   Pulse 73   Temp 98.2 F (36.8 C) (Axillary)   Resp 17   Ht 1.549 m (5' 0.98")   Wt 50.6 kg (111 lb 8.8 oz)   SpO2 (!) 88%   BMI 21.09 kg/m   General:  WNWD 83 y.o. female seen at bedside in no acute distress.   Head:  Normocephalic, atraumatic.  Eyes:  Pupils are equal/round bilaterally. EOMs intact.  Respiratory:  No signs of respiratory distress.  Cardiovascular:  DP pulses 2+. Cap refill < 2 seconds. Calves soft. No calf pain with palpation.  Neuro:  Alert. Sensation is grossly intact to the right lower extremity.  Extremity:  Right knee-passive ROM to about 45degrees, knee is soft, skin is warm and dry. No signs of infection. Skin intact. No laxity to collateral ligaments.  Skin:  No open lesions or rashes.   Psych:  Mood and affect appropriate.      Imaging Review:      Radiographs and CT of the right knee have been reviewed and discussed with Dr. Montel Culver. Interpretation:    ---    Xr Knee 1 Or 2 Views Right    Result Date: 02/26/2017  There is a large joint effusion and linear sclerosis along the lateral aspect of the proximal tibial metaphysis which is questionable for a minimally impacted fracture. Further evaluation with cross-sectional imaging should be considered when clinically feasible. MRI would be the modality of choice as it would allow for further characterization of the calcifications along the lateral joint line which could be secondary to atherosclerosis or a meniscal tear with a small amount of extruded meniscal tissue. ReadingStation:[email or web address]    Ct Knee Right Wo Contrast    Result Date: 02/26/2017  1.  Osteoporosis, marked cartilage loss in the lateral compartment, and chronic-appearing trabecular impaction lateral tibial plateau without lucent fracture line or depression. 2.  Large effusion and popliteal cyst. Chondrocalcinosis may be related to underlying hypercalcemia or CPPD.  ReadingStation:[email or web address]        Assessment:      83 year old female with Broca's aphasia secondary to CVA, seen by orthopedics for right knee pain.       Plan:      XR and CT reviewed. No definite fracture.  No orthopedic surgical intervention required at this time from Ortho trauma.  Weight bearing as tolerated.  Pain control.  Medical management per Hospitalist.  May need to follow up in Orthopedic joint specialist office after discharge for further evaluation of right knee.  Further recommendations per Dr. Manson Passey.         Thank you for consultation on this patient.      This chart was generated with the use of an electronic medical record and may contain errors not intended by the author.    --  Delia Chimes  [facility]  Pager # [phone]

## 2017-02-26 NOTE — Progress Note - Problem Oriented Charting Notewrit (Signed)
Date Time:  02/26/17 2:40 PM Attending: Marlou Sa, MD   Pt. Name:  Kristina [name]     PCP: Patsy Lager, MD          INTERIM SUMMARY:    83 year old female with a history of A. fib, on eliquis, who was recently admitted for bloody colitis, discharged from this facility on December 7, eliquis discontinued who presented with acute CVA.          SUBJECTIVE:    Overnight, patient complained of not being able to move her right lower extremity because of right knee pain.  Subsequent x-ray showed possible tibial fracture.  According to the daughter, the patient might have fallen a month ago.  3 loose bowel movements today       Objective :  Vitals:    02/26/17 0812 02/26/17 1100 02/26/17 1231 02/26/17 1238   BP: (!) 162/97 (!) 164/96 (!) 133/96    Pulse: (!) 107 (!) 106 83    Resp: 18      Temp: 98.1 F (36.7 C)  (!) 95.5 F (35.3 C) 98.4 F (36.9 C)   TempSrc: Oral  Oral    SpO2: 91%  97%    Weight:       Height:         No intake or output data in the 24 hours ending 02/26/17 1440  Weight Monitoring 02/20/2017 02/24/2017 02/24/2017   Height - 154.9 cm 154.9 cm   Height Method - Stated -   Weight 50.576 kg 50.6 kg 50.6 kg   Weight Method Standing Scale - -   BMI (calculated) - 21.1 kg/m2 21.1 kg/m2       Body mass index is 21.09 kg/m.        MEDS: (SCHEDULED/INFUSIONS/PRN)  Current Facility-Administered Medications   Medication Dose Route Frequency   . aspirin  81 mg Oral Daily   . atorvastatin  80 mg Oral QHS   . ciprofloxacin  500 mg Oral Q12H SCH   . dilTIAZem  240 mg Oral Daily   . fluticasone  1 spray Each Nare Daily   . latanoprost  1 drop Both Eyes QHS   . levothyroxine  112 mcg Oral QAM   . metoprolol tartrate  25 mg Oral Q12H SCH   . metroNIDAZOLE  500 mg Oral Q8H SCH   . sodium chloride (PF)  3 mL Intravenous Q8H     Current Facility-Administered Medications   Medication Dose Route Frequency Last Rate     Current Facility-Administered  Medications   Medication Dose Route   . acetaminophen  650 mg Oral    Or   . acetaminophen  650 mg per NG tube    Or   . acetaminophen  650 mg Rectal   . hydrALAZINE  10 mg Intravenous   . metoprolol tartrate  5 mg Intravenous   . naloxone  0.4 mg Intravenous            LABS:    Recent CBC:    Recent Labs  Lab 02/25/17  0344 02/24/17  1019   WBC 7.8 9.1   RBC 3.90 4.46   Hemoglobin 13.9 15.2   Hematocrit 41.5 47.6   MCV 106* 107*   PLT CT 179 181       Recent BMP:    Recent Labs  Lab 02/25/17  0344 02/24/17  1019   Sodium 136 139   Potassium 3.5 4.3   Chloride 106 106   CO2 20.9  19.9*   BUN 14 20   Creatinine 0.68 0.82   Glucose 92 95   EGFR 80 65   Calcium 8.7 9.8       Other Labs (if any):  LFTs     -     Recent Labs  Lab 02/21/17  0551   Magnesium 1.6        Recent Labs  Lab 02/24/17  1019 02/20/17  0850   ALT 16 16   AST (SGOT) 38 32   Bilirubin, Total 1.0 1.4*   Bilirubin, Direct  --  0.6*   Albumin 4.1 4.4   Alkaline Phosphatase 81 103     Cardiac -     PT/PTT -     Recent Labs  Lab 02/20/17  0850   PT INR 1.1          IMAGING STUDIES:  Ct Angiogram Head    Result Date: 02/25/2017  Normal CTA head. ReadingStation:[id number]    Xr Chest Pa Or Ap    Result Date: 02/21/2017  No pneumothorax status post right-sided thoracentesis. ReadingStation:[email or web address]    Xr Knee 1 Or 2 Views Right    Result Date: 02/26/2017  There is a large joint effusion and linear sclerosis along the lateral aspect of the proximal tibial metaphysis which is questionable for a minimally impacted fracture. Further evaluation with cross-sectional imaging should be considered when clinically feasible. MRI would be the modality of choice as it would allow for further characterization of the calcifications along the lateral joint line which could be secondary to atherosclerosis or a meniscal tear with a small amount of extruded meniscal tissue. ReadingStation:[email or web address]    Ct Head Wo Contrast    Result Date: 02/24/2017  Subarachnoid hemorrhage in  left frontal lobe, with surrounding vasogenic edema, loss of gray-white matter differentiation, mild effacement of left lateral ventricle, and 3 mm left-to-right midline shift. This mostly involves the region of Broca's area, which may explain the patient's difficulty with speech articulation. An underlying mass cannot be excluded. A stat MRI of the brain with contrast is recommended for further characterization. Findings were discussed with Dr. Nunzio Cory in the emergency room at 12:50pm on the 10th of December, 2018. ReadingStation:[email or web address]    Ct Angiogram Neck    Result Date: 02/25/2017  Abnormal CT angiogram of the neck demonstrating: 1. On the right the internal carotid is moderate stenosis of approximately 60%. The right vertebral artery is patent. 2. On the left there is mild narrowing the proximal internal carotid artery. The left vertebral artery is patent. ReadingStation:[id number]    Ct Chest W Contrast (trauma)    Result Date: 02/20/2017  IMPRESSION: Contrast CT of the chest showing a large right effusion, which is free-flowing, without evidence of underlying pleural enhancement or nodularity to suggest an exudate by CT. Areas of subsegmental atelectasis as described above, without evidence of endobronchial lesion or mass. Very significant right atrium and right ventricular enlargement with signs of right heart failure. Associated left atrial enhancement could indicate a combined right and left involvement. ReadingStation:[email or web address]    Mri Brain W Wo Contrast    Result Date: 02/24/2017  Abnormal MRI scan of the head demonstrating: 1. There is an acute cortical infarction of the left operculofrontal region with hemorrhagic features without significant mass effect 2. There is a chronic microhemorrhage in the left thalamus 3. Moderate cortical and subcortical atrophy. 4. Mild chronic microvascular ischemic disease. ReadingStation:[id number]    Xr Chest Ap Portable    Result Date: 02/20/2017  FINDINGS/IMPRESSION: Prominent consolidation of the right middle and right lower lobe, with underlying effusion, possibly partially loculated within the major fissure.. If a CT of the contrast is contemplated, it must be performed with IV contrast to characterize the consolidation and pleural process ReadingStation:[email or web address]    Ct Abdomen Pelvis Wo Iv/ W Po Cont    Result Date: 02/21/2017  1.  There is mild wall thickening along the ascending colon. This is suggestive of colitis. Findings could be infectious, inflammatory or ischemic in etiology. Consider further evaluation with endoscopy following the resolution of acute illness to exclude neoplastic wall thickening. 2.  Heterogeneous right basilar airspace disease is present and there is a small amount of residual right pleural fluid. In the setting of recent thoracentesis, airspace disease is suspected to represent resolving atelectasis. Superimposed consolidation cannot be excluded. 3.  Advanced atherosclerosis. 4.  Fibroid uterus. ReadingStation:[email or web address]    US Thoracentesis    Result Date: 02/21/2017  Successful ultrasound guided drainage catheter insertion and subsequent large volume thoracentesis. ReadingStation:[email or web address]         Physical Exam:  Vitals:  T:98.4 F (36.9 C)   BP:(!) 133/96, HR:83, RR:18, SaO2:97%  General: Awake, Alert. No Acute Distress.  HEENT:  NCAT, MMM, EOMI, PERRL  Cardiovascular: RRR. No murmurs. Brisk cap refill  Respiratory: CTAB. No rales, rhonchi or retractions.  Abdomen/GI: Nontender, nondistended. Normoactive BS  Integument: Skin intact, no bruises.  Extremities: Right knee effusion with decreased range of motion  Neurologic: Some degree of word finding, decreased mobility of the right lower extremity, unclear if this all due to right knee pain or weakness  Psychiatric: AAOx3, cooperative.       ASSESSMENT AND PLAN:    # Acute ischemic stroke with hemorrhagic conversion. On low-dose aspirin per neurology. Hold eliquis until repeat  head CT is done in 3 weeks. Continue Lipitor. PT/OT/ST. Diet per ST.    # Colitis: Unclear etiology. Continue empirical Cipro and Flagyl. Imodium when necessary for diarrhea    # Hypertension: Continue to hold outpatient medications    # Hypothyroidism: On Synthroid    # Atrial fibrillation: Continue to hold eliquis. Rate controlled without Cardizem or metoprolol.    # Right knee effusion with a possible tibial fracture: Consult orthopedics.      Signed by:  Marlou Sa, MD  Please contact at phone number [phone] for any questions.

## 2017-02-26 NOTE — Plan of Care (Signed)
Problem: Moderate/High Fall Risk Score >5  Goal: Patient will remain free of falls  Outcome: Progressing   02/26/17 1123   OTHER   High (Greater than 13) LOW-Fall Interventions Appropriate for Low Fall Risk;LOW-(VH Only) Continuous video monitoring;MOD-Initiate Yellow "Fall Risk" magnet communication tool;MOD-Remain with patient during toileting;MOD-Use of assistive devices -Bedside Commode if appropriate;MOD-Re-orient confused patients;MOD-Request PT/OT consult order for patients with gait/mobility impairment;MOD-Include family in multidisciplinary POC discussions;MOD-(VH Only) Consider use of "STOP Call For Help" sign;MOD-Place Fall Risk level on whiteboard in room;HIGH-Activate bed/chair exit alarm where available;HIGH-Apply yellow "Fall Risk" arm band;HIGH-(VH Only) Place red fall prevention sign on door/chart;HIGH-(VH Only) Place red star on assignment board;HIGH-(VH Only) Yellow slippers;HIGH-(VH Only) Place "Reset Bed Alarm" sign above bed if in use;HIGH-(VH Only) Keep door open for better visability;HIGH-(VH Only) Plan with CNA for priority hourly rounding       Problem: Every Day - Stroke  Goal: Stable vital signs and fluid balance  Outcome: Progressing   02/26/17 0105   Goal/Interventions addressed this shift   Stable vital signs and fluid balance  Position patient for maximum circulation/cardiac output;Monitor and assess vitals every 4 hours or as ordered and hemodynamic parameters;Encourage oral fluid intake;Apply telemetry monitor as ordered

## 2017-02-26 NOTE — PT Plan of Care Note (Signed)
[facility]  [facility]  Department of Rehabilitation Services  [phone]  MYRON LONA CSN: [id number]  NEURO SURGICAL 439/439-A    Physical Therapy General Note  Time: 1400    Last seen by a Physical Therapist vs. PTA: 02/25/17    Attempted to see patient for PT treatment at this time. RN informing that patient had and xray performed which showed a new fracture. Will hold PT intervention at this time until Orthopaedics consult completed.    Team Communication: RNMorrie Sheldon    Plan: Will follow up following Orthopaedics consult per POC.     Duard Brady, LPTA

## 2017-02-26 NOTE — Progress Notes (Addendum)
Patient is alert to self and place this morning. During assessment patient's right side is weaker and patient is unable to lift right arm off bed from elbow up. When patient is asked to lift right leg off bed patient states she is unable to and her right leg is numb and c/o pain. When asked if this is new patient said it was like this yesterday and today. Patient was on cell phone this morning. Took medications fine whole with water.

## 2017-02-26 NOTE — Progress Notes (Addendum)
MOBILITY NOTE:     Precautions: Weight Bearing: Full weight bearing     Activity:  Stood at bedside, Dangled on side of bed for 3 minutes     Level of Assistance: Moderate assist (patient does 50-74% of the task)     Device(s) used: Wheeled walker     Positioning frequency: Turns self as needed    Last Vitals: BP (!) 164/96   Pulse (!) 106   Temp 98.1 F (36.7 C) (Oral)   Resp 18   Ht 1.549 m (5' 0.98")   Wt 50.6 kg (111 lb 8.8 oz)   SpO2 91%   BMI 21.09 kg/m     Patient is unable to move right legs patient states " I can't do it ". Patient is up in chair needs are in reach Chair alarm is on nurse notified

## 2017-02-27 ENCOUNTER — Inpatient Hospital Stay: Payer: Medicare Other

## 2017-02-27 NOTE — OT Plan of Care Note (Signed)
[facility]  [facility]  [address]  [place] [address]  Department of Rehabilitation Services  [phone]    AINO HECKERT CSN: [id number]  NEURO SURGICAL 439/439-A    Occupational Therapy General Note  Time: 1520    Attempted to see pt for OT treatment; patient is out of room at this time for testing.     Team communication: PT - Luther Parody and RN Verdon Cummins    Plan: Will follow up this week per POC.     [place] Dejia Ebron, OTR/L

## 2017-02-27 NOTE — Progress Notes (Signed)
02/27/17 1508   CM Review   CM Comments DCP 02/27/17: Adm diarrhea/rectal bleeding-recent adm for colitis-IV abx; Dysarthria; MRI +infarct w/hemorrhagic conversion: Desires STR  PT/OT rec SRF referral sent to [facility] who is currently reviewing patient, othro has been consulted for knee pain, x-rays completed patient is currently non wt bearing, PT on hold                    [facility] LPN /DCP   [id number]

## 2017-02-27 NOTE — Progress Notes (Signed)
[facility]  [address]  [place], [place] [address]  [facility]: [phone]   FX: [phone]    NEUROLOGY PROGRESS NOTE    Date / Time: 02/27/17 9:27 AM  Patient Name: Kristina [name]  Attending Physician: Marlou Sa, MD      CHIEF COMPLAINT:     Chief Complaint   Patient presents with   . Diarrhea       HISTORY OF PRESENT ILLNESS:   Kristina [name] WNU:[id number], MRN: [id number], is a 83 y.o. female with history of atrial fibrillation, hypothyroidIism, HTN, brought to ED for diarrhea and dehydration. Patient and family also reported difficulty speaking that was noticed when patient woke up around 7AM.     CT head initially read as left frontal lobe subarachnoid hemorrhage with edema and midline shift.   MRI brain was performed to further evaluate, and it was determined that abnormality was actually ischemic infarct with hemorrhagic conversion.     Patient was previously on Eliquis for a fib, but this was stopped during hospitalization last week for rectal bleed. DOAC stopped 02/21/17. Plan was to restart 12/10, until imaging revealed hemorrhage.     ECHO performed 02/21/17 shows 35-40% EF, severe tricuspid regurgitation, moderate to severe pulmonic insufficiency, as well as other mild to moderate abnormalities.     12/11-No neuro changes overnight. Continues to have aphasia.    12/13- xray ordered for [name] knee pain and swelling, concerning for possible fracture. CT with large effusion and popliteal cyst, as well as osteoporosis and chronic trabecular impaction lateral tibial plateau.     REVIEW OF SYSTEMS:   Patient with expressive aphasia, unable to provide ROS.      CURRENT PROBLEM LIST:     Active Hospital Problems    Diagnosis   . Hypothyroidism   . Chronic systolic (congestive) heart failure   . Cerebrovascular accident (CVA) with intracranial hemorrhage   . Acute CVA (cerebrovascular accident)   . PAF (paroxysmal atrial fibrillation)   . HTN (hypertension)       PAST MEDICAL HISTORY:      Past Medical History:   Diagnosis Date   . A-fib    . Hypertension    . Melanoma        FAMILY HISTORY:   History reviewed. No pertinent family history.     SOCIAL HISTORY:     Social History     Social History   . Marital status: Widowed     Spouse name: N/A   . Number of children: N/A   . Years of education: N/A     Social History Main Topics   . Smoking status: Never Smoker   . Smokeless tobacco: Never Used   . Alcohol use Yes   . Drug use: No   . Sexual activity: Not Asked     Other Topics Concern   . None     Social History Narrative   . None       ALLERGIES:     Allergies   Allergen Reactions   . Lisinopril Anaphylaxis       MEDICATIONS:     Prior to Admission medications    Medication Sig Start Date End Date Taking? Authorizing Provider   amoxicillin-clavulanate (AUGMENTIN) 875-125 MG per tablet Take 1 tablet by mouth 2 (two) times daily.for 6 days 02/21/17 02/27/17 Yes Theda Sers, MD   dilTIAZem (CARDIZEM CD) 240 MG 24 hr capsule Take 240 mg by mouth every morning.     01/31/17  Yes  [provider]   folic acid (FOLVITE) 1 MG tablet Take 1 mg by mouth daily.   Yes [provider]   furosemide (LASIX) 20 MG tablet Take 1 tablet (20 mg total) by mouth daily.for 7 days 02/21/17 02/28/17 Yes Theda Sers, MD   lactase (LACTAID) 3000 units tablet Take 1 tablet by mouth 3 (three) times daily with meals.   Yes [provider]   lactobacillus species (BIO-K PLUS) capsule Take 1 capsule (50 Billion CFU total) by mouth daily. 02/22/17 03/24/17 Yes Theda Sers, MD   latanoprost (XALATAN) 0.005 % ophthalmic solution Place 1 drop into both eyes nightly.       Yes [provider]   levothyroxine (SYNTHROID, LEVOTHROID) 112 MCG tablet Take 112 mcg by mouth every morning.     01/20/17  Yes [provider]   LUMIGAN 0.01 % Solution Place 1 drop into both eyes nightly. 01/31/17  Yes [provider]   metoprolol tartrate (LOPRESSOR) 25 MG tablet Take 25 mg by mouth 2 (two)  times daily.     01/31/17  Yes [provider]   potassium chloride (K-TAB, KLOR-CON) 10 MEQ tablet Take 2 tablets (20 mEq total) by mouth daily.for 7 days 02/21/17 02/28/17 Yes Theda Sers, MD   [facility] 1-0.2 % Suspension Place 1 drop into both eyes every morning. 01/31/17  Yes [provider]   fluticasone (FLONASE) 50 MCG/ACT nasal spray 1 spray by Nasal route daily as needed for Rhinitis or Allergies.        [provider]   methotrexate 2.5 MG tablet Take 10 mg by mouth Once a week on Monday evening.     01/21/17   [provider]   nitroglycerin (NITROSTAT) 0.4 MG SL tablet Place 0.4 mg under the tongue every 5 (five) minutes as needed for Chest pain.     01/31/17   [provider]   PROAIR HFA 108 (90 Base) MCG/ACT inhaler Inhale 2 puffs into the lungs 2 (two) times daily as needed for Wheezing or Shortness of Breath.     12/31/16   [provider]               PHYSICAL EXAM:     Vitals:    02/27/17 0918   BP:    Pulse: (!) 109   Resp:    Temp:    SpO2: 91%       General: Patient is a 83 year old female, in no acute distress.   Mental status: Awake, alert, pleasantly confused. Continues to have significant expressive and receptive aphasia that is slightly improved.  Cranial Nerve Exam:  CN 2 Normal vision without visual field defects   CN 3,4,6 EOMs intact without nystagmus, PERRLA  CN 5 Facial sensation is symmetrical.  CN 7 Spontaneous facial expressions are symmetrical.  CN 8 Auditory acuity intact bilaterally to casual conversation.  CN 9 Palate rises with phonation.  CN 11 Trapezius and sternocleidomastoids intact.  CN 12 Tongue protrudes in midline.  Motor: Power is 5/5 in upper extremities, 4+/5 lower extremities. With normal tone and bulk for age. RLE is limited by pain.  Sensory:  Sensation intact to light touch throughout.   Coordination: Patient does not follow commands for rapid alternating movements.   DTRs: 2+ and symmetric  bilaterally  Gait: not tested    HEENT:  Head is midline, normocephalic, atraumatic.   Cardiac: irregularly irregular rhythm  Chest:  Breath sounds CTA bilaterally.  Abd:  Bowel  sounds normoactive in all quadrants.    Extremities:  No deformities, cyanosis, clubbing or edema. No new rash or bruise.    SIGNIFICANT LABS:     Results     Procedure Component Value Units Date/Time    Stool Culture [[id number]] Collected:  02/25/17 1037    Specimen:  Stool from Stool. Updated:  02/26/17 0956    Narrative:       Specimen/Source: Stool/Stool  Collected: 02/25/2017 10:37     Status: Valued      Last Updated: 02/26/2017 09:56                Culture Result (Prelim)      No Growth - Day 1, Reincubate                SIGNIFICANT IMAGING:     CT Knee Right WO Contrast   Final Result   1.  Osteoporosis, marked cartilage loss in the lateral compartment, and chronic-appearing trabecular impaction lateral tibial plateau without lucent fracture line or depression.   2.  Large effusion and popliteal cyst. Chondrocalcinosis may be related to underlying hypercalcemia or CPPD.             ReadingStation:[email or web address]      XR Knee 1 Or 2 Views Right   Final Result   There is a large joint effusion and linear sclerosis along the lateral aspect of the proximal tibial metaphysis which is questionable for a minimally impacted fracture. Further evaluation with cross-sectional imaging should be considered when clinically    feasible. MRI would be the modality of choice as it would allow for further characterization of the calcifications along the lateral joint line which could be secondary to atherosclerosis or a meniscal tear with a small amount of extruded meniscal    tissue.      ReadingStation:[email or web address]      CT Angiogram Head   Final Result   Normal CTA head.      ReadingStation:[id number]      CT Angiogram Neck   Final Result   Abnormal CT angiogram of the neck demonstrating:   1. On the right the internal carotid is moderate stenosis of  approximately 60%. The right vertebral artery is patent.   2. On the left there is mild narrowing the proximal internal carotid artery. The left vertebral artery is patent.      ReadingStation:[id number]      MRI Brain W WO Contrast   Final Result   Abnormal MRI scan of the head demonstrating:   1. There is an acute cortical infarction of the left operculofrontal region with hemorrhagic features without significant mass effect   2. There is a chronic microhemorrhage in the left thalamus   3. Moderate cortical and subcortical atrophy.   4. Mild chronic microvascular ischemic disease.      ReadingStation:[id number]      CT Head WO Contrast   Final Result   Subarachnoid hemorrhage in left frontal lobe, with surrounding vasogenic edema, loss of gray-white matter differentiation, mild effacement of left lateral ventricle, and 3 mm left-to-right midline shift. This mostly involves the region of Broca's area,    which may explain the patient's difficulty with speech articulation. An underlying mass cannot be excluded. A stat MRI of the brain with contrast is recommended for further characterization.      Findings were discussed with Dr. Nunzio Cory in the emergency room at 12:50pm on the 10th of December, 2018.      ReadingStation:[email or web address]  ASSESSMENT AND PLAN:   Kristina [name], 83 year old female initially brought to hospital with diarrhea and dehydration, had imaging for expressive aphasia, found to have cerebral infarction with hemorrhagic conversion.     1. Diagnostics:  1. MRI brain reports left frontal cerebral infarction with hemorrhagic conversion.   2. ECHO completed 02/21/17. No need to repeat.   3. CTA head/neck with [name] ICA ~60%.   4. Neuro checks and vital signs q4h  5. Telemetry.  6. Repeat CT head 3 weeks(has been ordered)  2. Labs:   1. Cholesterol 137, HDL 33, LDL 86, TG 90, B12 493, folate 16.8, HA1c 5.7  3. Medications:   1. Hold Eliquis until repeat CT head in 3 weeks. Continue low dose  ASA.  2. Atorvastatin 80 mg.   4. Rehabilitation services:   1. PT/OT  2. SLP    She will need to follow up with neurology in 4 weeks following CT head to determine if safe to restart Eliquis. Neurology signing off, please call with further questions.     Case discussed with Dr. Nunzio Cory who will additionally review diagnostics/labs, personally evaluate the patient, further determine the treatment plan, and amend this note as indicated.     Cala Bradford, PA-C  02/27/2017  9:27 AM  [phone]    I have reviewed the notes, assessments, and/or procedures performed by PA Julien Girt, I concur with her/his documentation of Kristina [name].

## 2017-02-27 NOTE — Progress Note - Problem Oriented Charting Notewrit (Signed)
Date Time:  02/27/17 2:05 PM Attending: Marlou Sa, MD   Pt. Name:  Kristina [name]     PCP: Patsy Lager, MD          INTERIM SUMMARY:    83 year old female with a history of A. fib, on eliquis, who was recently admitted for bloody colitis, discharged from this facility on December 7, eliquis discontinued who presented with acute Ischemic CVA and hemorrhagic conversion.          SUBJECTIVE:    Right knee less painful, still has difficulty ambulating. No diarrhea today       Objective :  Vitals:    02/27/17 0909 02/27/17 0918 02/27/17 1007 02/27/17 1155   BP:   114/72 107/65   Pulse: (!) 111 (!) 109 85 84   Resp:    18   Temp:    97.7 F (36.5 C)   TempSrc:    Axillary   SpO2: (!) 88% 91%  93%   Weight:       Height:         No intake or output data in the 24 hours ending 02/27/17 1405  Weight Monitoring 02/20/2017 02/24/2017 02/24/2017   Height - 154.9 cm 154.9 cm   Height Method - Stated -   Weight 50.576 kg 50.6 kg 50.6 kg   Weight Method Standing Scale - -   BMI (calculated) - 21.1 kg/m2 21.1 kg/m2       Body mass index is 21.09 kg/m.        MEDS: (SCHEDULED/INFUSIONS/PRN)  Current Facility-Administered Medications   Medication Dose Route Frequency   . aspirin  81 mg Oral Daily   . atorvastatin  80 mg Oral QHS   . ciprofloxacin  500 mg Oral Q12H SCH   . dilTIAZem  240 mg Oral Daily   . fluticasone  1 spray Each Nare Daily   . latanoprost  1 drop Both Eyes QHS   . levothyroxine  112 mcg Oral QAM   . metoprolol tartrate  25 mg Oral Q12H SCH   . metroNIDAZOLE  500 mg Oral Q8H SCH   . sodium chloride (PF)  3 mL Intravenous Q8H     Current Facility-Administered Medications   Medication Dose Route Frequency Last Rate     Current Facility-Administered Medications   Medication Dose Route   . acetaminophen  650 mg Oral    Or   . acetaminophen  650 mg per NG tube    Or   . acetaminophen  650 mg Rectal   . hydrALAZINE  10 mg Intravenous   . loperamide  1 mg  Oral   . metoprolol tartrate  5 mg Intravenous   . naloxone  0.4 mg Intravenous            LABS:    Recent CBC:    Recent Labs  Lab 02/25/17  0344 02/24/17  1019   WBC 7.8 9.1   RBC 3.90 4.46   Hemoglobin 13.9 15.2   Hematocrit 41.5 47.6   MCV 106* 107*   PLT CT 179 181       Recent BMP:    Recent Labs  Lab 02/25/17  0344 02/24/17  1019   Sodium 136 139   Potassium 3.5 4.3   Chloride 106 106   CO2 20.9 19.9*   BUN 14 20   Creatinine 0.68 0.82   Glucose 92 95   EGFR 80 65   Calcium 8.7 9.8  Other Labs (if any):  LFTs     -     Recent Labs  Lab 02/21/17  0551   Magnesium 1.6        Recent Labs  Lab 02/24/17  1019   ALT 16   AST (SGOT) 38   Bilirubin, Total 1.0   Albumin 4.1   Alkaline Phosphatase 81     Cardiac -     PT/PTT -            IMAGING STUDIES:  Ct Angiogram Head    Result Date: 02/25/2017  Normal CTA head. ReadingStation:[id number]    Xr Chest Pa Or Ap    Result Date: 02/21/2017  No pneumothorax status post right-sided thoracentesis. ReadingStation:[email or web address]    Xr Knee 1 Or 2 Views Right    Result Date: 02/26/2017  There is a large joint effusion and linear sclerosis along the lateral aspect of the proximal tibial metaphysis which is questionable for a minimally impacted fracture. Further evaluation with cross-sectional imaging should be considered when clinically feasible. MRI would be the modality of choice as it would allow for further characterization of the calcifications along the lateral joint line which could be secondary to atherosclerosis or a meniscal tear with a small amount of extruded meniscal tissue. ReadingStation:[id number]    Ct Head Wo Contrast    Result Date: 02/24/2017  Subarachnoid hemorrhage in left frontal lobe, with surrounding vasogenic edema, loss of gray-white matter differentiation, mild effacement of left lateral ventricle, and 3 mm left-to-right midline shift. This mostly involves the region of Broca's area, which may explain the patient's difficulty with speech  articulation. An underlying mass cannot be excluded. A stat MRI of the brain with contrast is recommended for further characterization. Findings were discussed with Dr. Nunzio Cory in the emergency room at 12:50pm on the 10th of December, 2018. ReadingStation:[id number]    Ct Angiogram Neck    Result Date: 02/25/2017  Abnormal CT angiogram of the neck demonstrating: 1. On the right the internal carotid is moderate stenosis of approximately 60%. The right vertebral artery is patent. 2. On the left there is mild narrowing the proximal internal carotid artery. The left vertebral artery is patent. ReadingStation:[id number]    Ct Knee Right Wo Contrast    Result Date: 02/26/2017  1.  Osteoporosis, marked cartilage loss in the lateral compartment, and chronic-appearing trabecular impaction lateral tibial plateau without lucent fracture line or depression. 2.  Large effusion and popliteal cyst. Chondrocalcinosis may be related to underlying hypercalcemia or CPPD.  ReadingStation:[email or web address]    Mri Brain W Wo Contrast    Result Date: 02/24/2017  Abnormal MRI scan of the head demonstrating: 1. There is an acute cortical infarction of the left operculofrontal region with hemorrhagic features without significant mass effect 2. There is a chronic microhemorrhage in the left thalamus 3. Moderate cortical and subcortical atrophy. 4. Mild chronic microvascular ischemic disease. ReadingStation:[id number]    Ct Abdomen Pelvis Wo Iv/ W Po Cont    Result Date: 02/21/2017  1.  There is mild wall thickening along the ascending colon. This is suggestive of colitis. Findings could be infectious, inflammatory or ischemic in etiology. Consider further evaluation with endoscopy following the resolution of acute illness to exclude neoplastic wall thickening. 2.  Heterogeneous right basilar airspace disease is present and there is a small amount of residual right pleural fluid. In the setting of recent thoracentesis, airspace disease is suspected  to represent resolving atelectasis. Superimposed consolidation cannot be excluded. 3.  Advanced atherosclerosis. 4.  Fibroid uterus. ReadingStation:[email or web address]    US Thoracentesis    Result Date: 02/21/2017  Successful ultrasound guided drainage catheter insertion and subsequent large volume thoracentesis. ReadingStation:[email or web address]         Physical Exam:  Vitals:  T:97.7 F (36.5 C) (Axillary)   BP:107/65, HR:84, RR:18, SaO2:93%  General: Awake, Alert. No Acute Distress.  HEENT:  NCAT, MMM, EOMI, PERRL  Cardiovascular: RRR. No murmurs. Brisk cap refill  Respiratory: CTAB. No rales, rhonchi or retractions.  Abdomen/GI: Nontender, nondistended. Normoactive BS  Integument: Skin intact, no bruises.  Extremities: Right knee effusion with improved range of motion  Neurologic: Improved speech. Questionable right-sided weakness.  Psychiatric: AAOx3, cooperative.       ASSESSMENT AND PLAN:    # Acute ischemic stroke with hemorrhagic conversion. On low-dose aspirin per neurology. Hold eliquis until repeat head CT is done in 3 weeks. Continue Lipitor. PT/OT/ST. Diet per ST.    # Colitis: Unclear etiology. Resolving. Continue empirical Cipro and Flagyl. Imodium when necessary for diarrhea    # Hypertension: Continue to hold outpatient medications    # Hypothyroidism: On Synthroid    # Atrial fibrillation: Continue to hold eliquis. Rate controlled without Cardizem or metoprolol.    # Right knee effusion with a possible old tibial fracture:  orthopedics following. Supportive care    Disposition: Family members will further discuss if she should go home or go to a nursing home for rehabilitation      Signed by:  Marlou Sa, MD  Please contact at phone number [phone] for any questions.

## 2017-02-27 NOTE — Plan of Care (Signed)
Problem: Every Day - Stroke  Goal: Neurological status is stable or improving  Outcome: Progressing   02/27/17 1610   Goal/Interventions addressed this shift   Neurological status is stable or improving Monitor/assess/document neurological assessment (Stroke: every 4 hours);Monitor/assess NIH Stroke Scale;Re-assess NIH Stroke Scale for any change in status;Perform CAM Assessment

## 2017-02-27 NOTE — Plan of Care (Addendum)
Problem: Every Day - Stroke  Goal: Neurological status is stable or improving  Outcome: Progressing  Neurological status is stable or improving according to NIH scale.     02/27/17 1610   Goal/Interventions addressed this shift   Neurological status is stable or improving Monitor/assess/document neurological assessment (Stroke: every 4 hours);Monitor/assess NIH Stroke Scale;Re-assess NIH Stroke Scale for any change in status;Perform CAM Assessment       Comments: Patient alert and interactive.  Patient a little withdrawn.  Patient compliant with meds.  Echocardiogram completed.  Patient in bed with alarm on and call bell within reach with son in room.

## 2017-02-27 NOTE — PT Plan of Care Note (Signed)
[facility]  [facility]  Department of Rehabilitation Services  [phone]  KINZI FREDIANI CSN: [id number]  NEURO SURGICAL 439/439-A    Physical Therapy General Note  Time: 1520    Last seen by a Physical Therapist vs. PTA: 02/25/17    Attempted to see patient for PT treatment.  Patient out of room at this time for testing.     Team Communication: [place], OT, chart review    Plan: Will follow up this week per POC.     Sydell Axon, PT, DPT

## 2017-02-28 LAB — CBC AND DIFFERENTIAL
Basophils %: 0.4 % (ref 0.0–3.0)
Basophils Absolute: 0 10*3/uL (ref 0.0–0.3)
Eosinophils %: 0.5 % (ref 0.0–7.0)
Eosinophils Absolute: 0 10*3/uL (ref 0.0–0.8)
Hematocrit: 39.1 % (ref 36.0–48.0)
Hemoglobin: 12.6 gm/dL (ref 12.0–16.0)
Lymphocytes Absolute: 1.4 10*3/uL (ref 0.6–5.1)
Lymphocytes: 16.5 % (ref 15.0–46.0)
MCH: 34 pg (ref 28–35)
MCHC: 32 gm/dL (ref 32–36)
MCV: 106 fL — ABNORMAL HIGH (ref 80–100)
MPV: 8.5 fL (ref 6.0–10.0)
Monocytes Absolute: 1.3 10*3/uL (ref 0.1–1.7)
Monocytes: 14.8 % (ref 3.0–15.0)
Neutrophils %: 67.8 % (ref 42.0–78.0)
Neutrophils Absolute: 5.9 10*3/uL (ref 1.7–8.6)
PLT CT: 191 10*3/uL (ref 130–440)
RBC: 3.68 10*6/uL — ABNORMAL LOW (ref 3.80–5.00)
RDW: 13.4 % (ref 11.0–14.0)
WBC: 8.7 10*3/uL (ref 4.0–11.0)

## 2017-02-28 LAB — BASIC METABOLIC PANEL
Anion Gap: 11.4 mMol/L (ref 7.0–18.0)
BUN / Creatinine Ratio: 31.8 Ratio — ABNORMAL HIGH (ref 10.0–30.0)
BUN: 21 mg/dL (ref 7–22)
CO2: 17.9 mMol/L — ABNORMAL LOW (ref 20.0–30.0)
Calcium: 8.7 mg/dL (ref 8.5–10.5)
Chloride: 105 mMol/L (ref 98–110)
Creatinine: 0.66 mg/dL (ref 0.60–1.20)
EGFR: 81 mL/min/{1.73_m2} (ref 60–150)
Glucose: 95 mg/dL (ref 71–99)
Osmolality Calc: 265 mOsm/kg — ABNORMAL LOW (ref 275–300)
Potassium: 3.3 mMol/L — ABNORMAL LOW (ref 3.5–5.3)
Sodium: 131 mMol/L — ABNORMAL LOW (ref 136–147)

## 2017-02-28 LAB — ECG 12-LEAD
Patient Age: 87 years
Q-T Interval(Corrected): 516 ms
Q-T Interval: 478 ms
QRS Axis: 2 deg
QRS Duration: 100 ms
T Axis: 21 years
Ventricular Rate: 70 //min

## 2017-02-28 MED ORDER — CEPHALEXIN 500 MG PO CAPS
500.00 mg | ORAL_CAPSULE | Freq: Three times a day (TID) | ORAL | Status: DC
Start: 2017-02-28 — End: 2017-03-02
  Administered 2017-02-28 – 2017-03-02 (×6): 500 mg via ORAL
  Filled 2017-02-28 (×8): qty 1

## 2017-02-28 MED ORDER — VH POTASSIUM CHLORIDE CRYS ER 20 MEQ PO TBCR (WRAP)
20.00 meq | EXTENDED_RELEASE_TABLET | Freq: Once | ORAL | Status: AC
Start: 2017-02-28 — End: 2017-02-28
  Administered 2017-02-28: 15:00:00 20 meq via ORAL
  Filled 2017-02-28 (×2): qty 1

## 2017-02-28 MED ORDER — LORAZEPAM 2 MG/ML IJ SOLN
0.50 mg | INTRAMUSCULAR | Status: DC | PRN
Start: 2017-02-28 — End: 2017-03-01
  Administered 2017-02-28: 22:00:00 0.5 mg via INTRAVENOUS
  Filled 2017-02-28: qty 1

## 2017-02-28 NOTE — Progress Notes (Signed)
02/28/17 1356   CM Review   CM Comments DCP 02/28/17: Adm diarrhea/rectal bleeding-recent adm for colitis-IV abx; Dysarthria; MRI +infarct w/hemorrhagic conversion: Desires STR  PT/OT rec SRF referral sent to [facility] who is currently reviewing patient, x-rays completed patient is currently non wt bearing right LE  PT/OT unable to work with last two sessions, DCP spoke to patient son and daughter and updated them that [facility] currently has no be to offer patient family has agreed to expand search to [place], [facility], and [facility] referrals sent awaiting bed offers                    Judson Roch LPN /DCP   [id number]

## 2017-02-28 NOTE — Progress Notes (Signed)
MOBILITY NOTE:     Precautions: Weight Bearing: Right LE Non weight bearing LLE weight bearing as tolerated      Activity:  Ambulated in the room     Level of Assistance: Moderate assist (patient does 50-74% of the task)     Device(s) used: Wheeled walker     Positioning frequency: Every 2 hours    Last Vitals: BP 105/60   Pulse 70   Temp 97.3 F (36.3 C) (Oral)   Resp 16   Ht 1.549 m (5' 0.98")   Wt 50.6 kg (111 lb 8.8 oz)   SpO2 91%   BMI 21.09 kg/m    ]  Patients Needs are in reach chair alarm is on nurse aware.

## 2017-02-28 NOTE — PT Progress Note (Signed)
VHS: [facility]  Department of Rehabilitation Services: [phone]  ANALYSIA DUNGEE    CSN: [id number]    NEURO SURGICAL   439/439-A    Physical Therapy Treatment Note    Time of treatment:   Time Calculation  PT Received On: 02/28/17  Start Time: 1329  Stop Time: 1355  Time Calculation (min): 26 min    Visit#: 2    Last seen by Physical therapist vs. PTA: 02/25/17    Medical Diagnosis/Pertinent medical/surgical details: admitted 02/24/2017 with dysarthria and persistent diarrhea with imaging revealing ischemic infarct with hemorrhagic conversion.  Patient now with Broca's aphasia.    Precautions and Contraindications:  Falls  Mobility protocol   WBAT RLE per Dr. Manson Passey (attested note 02/27/17, confirmed via phone call 12/14)    Assessment:   Patient's progress towards established goals: PTA noting discrepancy between weight bearing orders in managed orders and written weight bearing orders in MD note; author confirmed via phone call that patient is WBAT on RLE. She increased ambulation distance to 80 feet with minimal assistance; no gross loss of balance noted, but patient demonstrating intermittent unsteadiness.      Patient continues to have the following impairments: decreased activity tolerance, decreased functional mobility, decreased balance, gait deficits    Patient will continue to benefit from skilled PT services in order to maximize functional independence.        Goals:   LTG (by discharge)   1.  Patient will perform bed mobility with independence assistance in prep for out of bed activity. Ongoing  2.  Patient will perform sit to stand transfers with front wheeled walker and mod I assistance in prep for ambulation. Ongoing  3.  Patient will ambulate 150 feet with front wheeled walker and mod I assistance for household/community ambulation. Ongoing     *Further goals to be determined based on patient's progress in therapy     Plan:   Treatment/interventions: Exercise, Gait training,  Neuromuscular re-education, Functional transfer training, LE strengthening/ROM, Patient/caregiver training, Equipment eval/education, Bed mobility    Treatment Frequency: 4-5x/wk    DISCHARGE RECOMMENDATIONS   DME recommended for Discharge:   Front wheeled walker    Discharge Recommendations:   Acute Rehab     Subjective:   "I want to lay down."   Patient is agreeable to participation in the therapy session. Nursing clears patient for therapy.    Pain:  At Rest: 0 /10  With Activity: 0/10  Location: N/A  Interventions: None required    OBJECTIVE:   Observation of Patient/Vital Signs:   Patient is seated in a bedside chair with telemetry  Patient's medical condition is appropriate for Physical therapy intervention at this time.    Vital Signs:  See flowsheet between start time and stop times for vitals taken during session     Command following: Follows 1 step commands with increased time, Follows 1 step commands with repetition  Alertness/Arousal: Appropriate responses to stimuli   Attention Span:Attends to task with redirection  Safety Awareness: minimal verbal instruction  Insights: Educated in safety awareness    Musculoskeletal and Balance Details:   Patient able to perform all transfers and ambulate with FWW and no loss of balance, but intermittent unsteadiness noted.           Bed Mobility:   Sit to Supine:   Minimal assist.   Cues for Sequencing., assist at LE(s)    Transfers:  Sit to Stand:  Minimal assist with Front wheeled walker.  Cues for Sequencing, Cues for Hand Placement  Stand to Sit:  Minimal assist.    Cues for Hand Placement    Locomotion:  LEVEL AMBULATION:  Distance: 80 feet   Assistance level:  Minimal assist  Device:  Front wheeled walker  Pattern:  Step through, Decreased cadence, Decreased clearance:  bilaterally  Gait train details:cues for maintaining close proximity to walker    Participation and Activity Tolerance   Participation effort: Good  Activity Tolerance: Tolerates 10-20  minutes of activity with multiple rests    Other Treatment Interventions this session:   Therapeutic activity  Gait training  Patient/family/caregiver education     Education Provided:   TOPICS: role of physical therapy, plan of care, goals of therapy and safety with mobility and ADLs, benefits of activity, use of adaptive equipment, bed mobility with use of adaptive equipment or strategy, activity with nursing    Learner educated: Patient  Method: Explanation  Response to education: Verbalized understanding and Needs reinforcement    Patient Position at End of Treatment:   Supine, in bed, Needs in reach, Bed/chair alarm set and No distress    Team Communication:   Spoke to : RN/LPN - Verdon Cummins  Regarding: Pre-session re: patient status, Patient position at end of session, Patient participation with Therapy  Whiteboard updated: No  PT/PTA communication: via written note and verbal communication as needed.      Recommend patient up to chair for all meals, ambulate in hallway with FWW and Ax1 2-3x/day outside of PT sessions.    Duard Brady, LPTA

## 2017-02-28 NOTE — UM Notes (Signed)
R     [facility] - [address]  EHR eReferral Notification Requirements    To be sent by secure email to [email or web address] or   by fax to [phone]   [facility] Resources  FIELDS ARE REQUIRED TO BE COMPLETED     Patient Name  Kristina [name]  Account Number   [id number]  Date of Birth:  27-Aug-1929  Date of Admission:   02/20/2017  Start of Initial Service Date: 02/21/2017     PLEASE CHECK OFF TYPE OF REVIEW & CURRENT ADMISSION STATUS FROM LISTS BELOW    Type of Review:  x Admission Review  Observation Follow-Up Review  . Dates to be Reviewed:   IP Continued Stay Review  . Dates to be Reviewed:   Procedure Review  Cardiac Procedure Review      Concurrent Denial   (If selected, please complete page 2)  Admission Review & Concurrent Denial if found IP (If selected, please complete page 2)  xPost Discharge Review  . Discharged Date: 02/21/2017  Readmission Review  . Dates of Previous Admission:     Current Order:Inpatient      Observation / Outpatient Services  Outpatient Procedure / Cardiology / Vascular   Other  Case Manager Name/Contact Number: Antoine Poche [phone]  Attending Physician/Contact Number: DO NOT CONTACT MD   Comments:        Additional Information being Emailed or Faxed:   Yes              x   No   Fax Handwritten Supporting Documents to EHR at [phone]

## 2017-02-28 NOTE — Progress Note - Problem Oriented Charting Notewrit (Addendum)
Date Time:  02/28/17 12:22 PM Attending: Ronnette Juniper, DO   Pt. Name:  Kristina [name]     PCP: Patsy Lager, MD          INTERIM SUMMARY: per Dr Dierdre Searles    83 year old female with a history of A. fib, on eliquis, who was recently admitted for bloody colitis, discharged from this facility on December 7, eliquis discontinued who presented with acute Ischemic CVA and hemorrhagic conversion.            Follow up for CVA and colitis  12/14 pt is mildly confused, having difficulty speaking, expressive asphasia, RN states last diarrhea was yesterday, pt with n/v this afternoon         Objective :  Vitals:    02/28/17 0300 02/28/17 0811 02/28/17 0925 02/28/17 1125   BP: 105/65 105/60 105/60 98/59   Pulse:  70 70 73   Resp: 19 16  15    Temp: 98.7 F (37.1 C) 97.3 F (36.3 C)  98.2 F (36.8 C)   TempSrc: Oral Oral  Axillary   SpO2: 97% 91%  91%   Weight:       Height:           Intake/Output Summary (Last 24 hours) at 02/28/17 1222  Last data filed at 02/28/17 0300   Gross per 24 hour   Intake              450 ml   Output                0 ml   Net              450 ml     Weight Monitoring 02/20/2017 02/24/2017 02/24/2017   Height - 154.9 cm 154.9 cm   Height Method - Stated -   Weight 50.576 kg 50.6 kg 50.6 kg   Weight Method Standing Scale - -   BMI (calculated) - 21.1 kg/m2 21.1 kg/m2       Body mass index is 21.09 kg/m.        MEDS: (SCHEDULED/INFUSIONS/PRN)  Current Facility-Administered Medications   Medication Dose Route Frequency   . aspirin  81 mg Oral Daily   . atorvastatin  80 mg Oral QHS   . ciprofloxacin  500 mg Oral Q12H SCH   . dilTIAZem  240 mg Oral Daily   . fluticasone  1 spray Each Nare Daily   . latanoprost  1 drop Both Eyes QHS   . levothyroxine  112 mcg Oral QAM   . metroNIDAZOLE  500 mg Oral Q8H SCH   . sodium chloride (PF)  3 mL Intravenous Q8H     Current Facility-Administered Medications   Medication Dose Route Frequency Last Rate      Current Facility-Administered Medications   Medication Dose Route   . acetaminophen  650 mg Oral    Or   . acetaminophen  650 mg per NG tube    Or   . acetaminophen  650 mg Rectal   . hydrALAZINE  10 mg Intravenous   . loperamide  1 mg Oral   . metoprolol tartrate  5 mg Intravenous   . naloxone  0.4 mg Intravenous            LABS:    Recent CBC:    Recent Labs  Lab 02/28/17  0528 02/25/17  0344   WBC 8.7 7.8   RBC 3.68* 3.90   Hemoglobin 12.6 13.9   Hematocrit 39.1 41.5  MCV 106* 106*   PLT CT 191 179       Recent BMP:    Recent Labs  Lab 02/28/17  0528 02/25/17  0344   Sodium 131* 136   Potassium 3.3* 3.5   Chloride 105 106   CO2 17.9* 20.9   BUN 21 14   Creatinine 0.66 0.68   Glucose 95 92   EGFR 81 80   Calcium 8.7 8.7       Other Labs (if any):  LFTs     -          Recent Labs  Lab 02/24/17  1019   ALT 16   AST (SGOT) 38   Bilirubin, Total 1.0   Albumin 4.1   Alkaline Phosphatase 81     Cardiac -     PT/PTT -            IMAGING STUDIES:  Ct Angiogram Head    Result Date: 02/25/2017  Normal CTA head. ReadingStation:[id number]    Xr Knee 1 Or 2 Views Right    Result Date: 02/26/2017  There is a large joint effusion and linear sclerosis along the lateral aspect of the proximal tibial metaphysis which is questionable for a minimally impacted fracture. Further evaluation with cross-sectional imaging should be considered when clinically feasible. MRI would be the modality of choice as it would allow for further characterization of the calcifications along the lateral joint line which could be secondary to atherosclerosis or a meniscal tear with a small amount of extruded meniscal tissue. ReadingStation:[email or web address]    Ct Head Wo Contrast    Result Date: 02/24/2017  Subarachnoid hemorrhage in left frontal lobe, with surrounding vasogenic edema, loss of gray-white matter differentiation, mild effacement of left lateral ventricle, and 3 mm left-to-right midline shift. This mostly involves the region of Broca's area,  which may explain the patient's difficulty with speech articulation. An underlying mass cannot be excluded. A stat MRI of the brain with contrast is recommended for further characterization. Findings were discussed with Dr. Nunzio Cory in the emergency room at 12:50pm on the 10th of December, 2018. ReadingStation:[email or web address]    Ct Angiogram Neck    Result Date: 02/25/2017  Abnormal CT angiogram of the neck demonstrating: 1. On the right the internal carotid is moderate stenosis of approximately 60%. The right vertebral artery is patent. 2. On the left there is mild narrowing the proximal internal carotid artery. The left vertebral artery is patent. ReadingStation:[id number]    Ct Knee Right Wo Contrast    Result Date: 02/26/2017  1.  Osteoporosis, marked cartilage loss in the lateral compartment, and chronic-appearing trabecular impaction lateral tibial plateau without lucent fracture line or depression. 2.  Large effusion and popliteal cyst. Chondrocalcinosis may be related to underlying hypercalcemia or CPPD.  ReadingStation:[email or web address]    Mri Brain W Wo Contrast    Result Date: 02/24/2017  Abnormal MRI scan of the head demonstrating: 1. There is an acute cortical infarction of the left operculofrontal region with hemorrhagic features without significant mass effect 2. There is a chronic microhemorrhage in the left thalamus 3. Moderate cortical and subcortical atrophy. 4. Mild chronic microvascular ischemic disease. ReadingStation:[id number]    Ct Abdomen Pelvis Wo Iv/ W Po Cont    Result Date: 02/21/2017  1.  There is mild wall thickening along the ascending colon. This is suggestive of colitis. Findings could be infectious, inflammatory or ischemic in etiology. Consider further evaluation with endoscopy following the resolution of acute illness to exclude neoplastic wall  thickening. 2.  Heterogeneous right basilar airspace disease is present and there is a small amount of residual right pleural fluid. In the setting  of recent thoracentesis, airspace disease is suspected to represent resolving atelectasis. Superimposed consolidation cannot be excluded. 3.  Advanced atherosclerosis. 4.  Fibroid uterus. ReadingStation:[email or web address]         Physical Exam:  Vitals:  T:98.2 F (36.8 C) (Axillary)   BP:98/59, HR:73, RR:15, SaO2:91%  General: Awake, Alert. No Acute Distress.  HEENT:  NCAT, MMM, EOMI  Cardiovascular: RRR. No murmurs. Brisk cap refill  Respiratory: CTAB. No rales, rhonchi or retractions.  Abdomen/GI: Nontender, nondistended. Normoactive BS  Integument: Skin intact, no bruises.  Extremities: Right knee effusion with improved range of motion  Neurologic: Expressive aphasia, no facial droop, mild right-sided weakness.  Psychiatric: AA, cooperative.       ASSESSMENT AND PLAN:    # Acute ischemic stroke with hemorrhagic conversion.   On low-dose aspirin per neurology.   Hold eliquis until repeat head CT is done in 3 weeks.   Continue Lipitor. PT/OT/ST. Diet per ST.  Echo:    Conclusions        1: The left ventricular ejection fraction is mildly decreased, estimated at 40-45%.                       2: Unable to assess diastolic function due to atrial fibrillation.                       3: The right ventricular size is moderately increased.                       4: The right ventricular systolic function is mildly decreased.                       5: The left atrium is moderately enlarged.                       6: The right atrium is severely enlarged.                       7: RVSP normal at 25-30 mmHg.       # chronic systolic CHF  bblocker stopped due to bradycardia   Not currently on lasix as not acute CHF  No ACE due to allergy    # Colitis: Unclear etiology. Resolving. Continue empirical Cipro and Flagyl. Imodium when necessary for diarrhea  cipro stopped due to prolonged QT  Started keflex instead along with flagyl  Diarrhea improving  negative c diff    # Hypertension: Continue to hold outpatient medications    #  Hypothyroidism: On Synthroid    # Atrial fibrillation: Continue to hold eliquis. Rate controlled without Cardizem and metoprolol.  However bradycardia transiently into 30s will stop metoprolol and follow on tele     # Right knee effusion with a possible old tibial fracture:  orthopedics following. Supportive care    # prolonged QTc 516, stop cipro  Avoid anti-emetics, not given due to prolonged QT  Ativan prn for nausea/anxiety ordered  Monitor on tele    Disposition: awaiting PT eval for dispo recommendations      Signed by:  Ronnette Juniper, DO  Please contact at phone number [phone] for any questions.

## 2017-02-28 NOTE — Plan of Care (Addendum)
Problem: Every Day - Stroke  Goal: Neurological status is stable or improving  Outcome: Progressing  Patients neurological status is stable or improving according to NIH scale.     02/28/17 0825   Goal/Interventions addressed this shift   Neurological status is stable or improving Monitor/assess/document neurological assessment (Stroke: every 4 hours);Monitor/assess NIH Stroke Scale;Re-assess NIH Stroke Scale for any change in status       Comments: Patient alert and interactive.  Patient Demonstrating better fluency and improved aphasia.  Patient mobility as tolerated.  Patient prescribed ativan for anxiety and nausea.  Patient has prolonged QT interval.  Patient in bed with call bell within reach and alarm on.

## 2017-02-28 NOTE — OT Progress Note (Signed)
VHS: [facility]  Department of Rehabilitation Services: [phone]    ALBERTIA CARVIN    CSN#: [id number]  NEURO SURGICAL 439/439-[id number]    Occupational Therapy Progress Note    Patient's medical condition is appropriate for Occupational therapy intervention at this time.    Time of treatment:   Time Calculation  OT Received On: 02/28/17  Start Time: 1330  Stop Time: 1355  Time Calculation (min): 25 min    Visit#: 2    Medical Diagnosis/Pertinent medical/surgical details: and persistent diarrhea with imaging revealing ischemic infarct with hemorrhagic conversion. Patient now with Broca's aphasia    Precautions and Contraindications:   Falls  Mobility protocol   Broca's aphasia  WBAT RLE per Dr. Manson Passey (attested note 02/27/17, confirmed via phone call 12/14)    Assessment:   Patient's progress towards established goals: patient met two goals this session; patient has been cleared by orthopedics to be WBAT on right LE (in note and confirmed by phone). Patient with increased activity tolerance and independence with self-care tasks. Patient still requiring close supervision to [facility] for mobility.       Patient continues to have the following impairments: decreased ROM, decreased strength, balance deficits, decreased independence with ADLs, decreased independence with IADLs, decreased activity tolerance, decrease safety awareness, decreased problem solving, decreased functional mobility, decreased functional transfers    Patient will continue to benefit from skilled OT services in order to increase independence and safety with ADLs and functional transfers/mobility.        Goals:   In 2 to 4 visits:  1. Patient will donn/doff socks with Modified independence  and use of AE/AT prn. ONGOING  2. Patient will thread pants/undergarments over knees with Modified independence  and use of AE/AT/AD prn. ONGOING  3. Patient will stand and arrange pants/undergarments over hips with Modified independence  and use of  AE/AT/AD prn.  ONGOING  4. Patient will donn/doff shirt/gown with Modified independence  and use of AT/AE prn. ONGOING  5. Patient will perform toilet transfer with Supervision and use of AD/DME prn. MET 02/28/2017  6. Patient will perform tub/shower transfer with Supervision and use of AD/DME prn. ONGOING  7. Patient will perform 1-2 grooming tasks with Supervision and use of AE/AT prn. MET 02/28/2017    LTG (by d/c):   1. Patient will be Modified independence  for ADLs, Modified independence  for functional transfers/mobility with use of AD/DME/AT/AE. NEW      Plan:   Treatment/interventions: ADL retraining, functional transfer training, activity pacing, cognition (safety awareness, problem solving, etc.), patient/family training, equipment eval/education, compensatory technique education    Treatment Frequency: OT Frequency Recommended: 4-5x/wk     DISCHARGE RECOMMENDATIONS   DME recommended for Discharge:   TBD closer to d/c  TBD at next level of care    Discharge Recommendations:   Acute Rehab         Subjective:   "I think I need to use the bathroom"     Patient still with Broca's aphasia but able to hint at want she needs    Patient is agreeable to participation in the therapy session. Nursing clears patient for therapy.    Pain:  At Rest: 0 /10  With Activity: 0/10  Location: N/A  Interventions: None required    Objective:   Observation of Patient:    Patient is seated in a bedside chair with no medical equipment            Vital Signs:  See flowsheet between start time and stop times for vitals taken during session      Oriented to: Person and Place   Command following: Follows 1 step commands with increased time, Follows 1 step commands with repetition  Alertness/Arousal: Delayed responses to stimuli   Attention Span:Appears intact  Memory: Appears intact  Safety Awareness: minimal verbal instruction  Insights: Fully aware of deficits, Educated in safety awareness  Problem Solving: supervision  Behavior:  Cooperative  Cognitive Deficits: Broca's aphasia    Balance:  Static Sitting:  Good  Dynamic Sitting:  Good  Static Standing:  Fair+  Dynamic Standing:  Fair+    Activities of Daily Living:   GROOMING: Supervision;  wash/dry hands performed standing at sink with FWW   LB Dressing: Supervision; thread RLE into underwear, thread LLE into underwear, pull up over hips, pull down from hips performed  standing with  Front wheeled walker, seated on toilet  TOILETING:  Supervision for clothing management up, clothing management down, perineal hygiene; standard toilet, standing with FWW      Functional Mobility:   Bed Mobility:  Sit to Supine:   Minimal assist.        Seated Scooting:   Supervision    Transfers:  Sit to Stand:  Minimal assist with Front wheeled walker.        Stand to Sit:  Minimal assist.        Toilet: Minimal assist with Front wheeled walker.        Functional mobility/ambulation: Minimal assist with Front wheeled walker.      Participation and Activity Tolerance   Participation effort: Good  Activity Tolerance: Tolerates 10-20 minutes of activity with multiple rests    Other Treatment Interventions:   Treatment Activities:   Not applicable          Education Provided:   Topics: Role of occupational therapy, plan of care, goals of therapy and safety with mobility and ADLs, benefits of activity, bed mobility with use of adaptive equipment or strategy, activity with nursing.    Individuals educated: Patient.  Method: Explanation.  Response to education: Needs reinforcement.    Team Communication:   OT communicated with: RN/LPN - Verdon Cummins and PTA - Kailin  Co-treatment with PTA secondary to patient fatigue and decreased activity tolerance  OT communicated regarding: Pre-session re: patient status, Patient position at end of session, Discharge needs, Patient participation with Therapy, Vital signs, Further recommendations  OT/COTA communication: via written note and verbal communication as needed.    Supine, in  bed, in the room, Needs in reach, Bed/chair alarm set and No distress    Recommend client be assisted to chair for all meals outside of and in addition to OT session.    [place] Syon Tews, OTR/L

## 2017-03-01 LAB — BASIC METABOLIC PANEL
Anion Gap: 12.1 mMol/L (ref 7.0–18.0)
BUN / Creatinine Ratio: 38 Ratio — ABNORMAL HIGH (ref 10.0–30.0)
BUN: 27 mg/dL — ABNORMAL HIGH (ref 7–22)
CO2: 16.4 mMol/L — ABNORMAL LOW (ref 20.0–30.0)
Calcium: 8.8 mg/dL (ref 8.5–10.5)
Chloride: 107 mMol/L (ref 98–110)
Creatinine: 0.71 mg/dL (ref 0.60–1.20)
EGFR: 78 mL/min/{1.73_m2} (ref 60–150)
Glucose: 88 mg/dL (ref 71–99)
Osmolality Calc: 269 mOsm/kg — ABNORMAL LOW (ref 275–300)
Potassium: 3.5 mMol/L (ref 3.5–5.3)
Sodium: 132 mMol/L — ABNORMAL LOW (ref 136–147)

## 2017-03-01 LAB — MAGNESIUM: Magnesium: 1.7 mg/dL (ref 1.6–2.6)

## 2017-03-01 MED ORDER — LORAZEPAM 0.5 MG PO TABS
0.50 mg | ORAL_TABLET | ORAL | Status: DC | PRN
Start: 2017-03-01 — End: 2017-03-04

## 2017-03-01 MED ORDER — DILTIAZEM HCL 30 MG PO TABS
30.00 mg | ORAL_TABLET | Freq: Four times a day (QID) | ORAL | Status: DC
Start: 2017-03-01 — End: 2017-03-02
  Administered 2017-03-01 – 2017-03-02 (×4): 30 mg via ORAL
  Filled 2017-03-01 (×9): qty 1

## 2017-03-01 MED ORDER — LACTATED RINGERS IV SOLN
INTRAVENOUS | Status: AC
Start: 2017-03-01 — End: 2017-03-01

## 2017-03-01 NOTE — Plan of Care (Addendum)
Problem: Moderate/High Fall Risk Score >5  Goal: Patient will remain free of falls  Outcome: Progressing   03/01/17 1622   OTHER   High (Greater than 13) LOW-Fall Interventions Appropriate for Low Fall Risk;HIGH-Activate bed/chair exit alarm where available;HIGH-Apply yellow "Fall Risk" arm band;HIGH-(VH Only) Place red fall prevention sign on door/chart;HIGH-(VH Only) Place red star on assignment board;HIGH-(VH Only) Yellow slippers;HIGH-(VH Only) Keep door open for better visability;HIGH-([facility] Only) Plan with CNA for priority hourly rounding       Problem: Every Day - Stroke  Goal: Neurological status is stable or improving  Outcome: Progressing   03/01/17 1622   Goal/Interventions addressed this shift   Neurological status is stable or improving Monitor/assess/document neurological assessment (Stroke: every 4 hours);Monitor/assess NIH Stroke Scale;Re-assess NIH Stroke Scale for any change in status;Observe for seizure activity and initiate seizure precautions if indicated     Goal: Skin integrity is maintained or improved  Outcome: Progressing   03/01/17 1622   Goal/Interventions addressed this shift   Skin integrity is maintained or improved Assess Braden Scale every shift;Turn or reposition patient every 2 hours or as needed unless able to reposition self;Increase activity as tolerated/progressive mobility;Relieve pressure to bony prominences;Avoid shearing;Keep skin clean and dry     Assumed care of patient at 0715 hrs. Vital signs are stable. Assessment completed. No changes in neuro status. Patient received 500 ml's of LR as ordered. Dr is aware of systolic BP's in 16'X. Speech has improved throughout the day. Family in to visit today. Has been up in chair and ambulated in hall as well. Call bell is within reach.

## 2017-03-01 NOTE — Progress Note - Problem Oriented Charting Notewrit (Signed)
Date Time:  03/01/17 7:40 AM Attending: Marlou Sa, MD   Pt. Name:  Kristina [name]     PCP: Patsy Lager, MD          INTERIM SUMMARY: per Dr Dierdre Searles    83 year old female with a history of A. fib, on eliquis, who was recently admitted for bloody colitis, discharged from this facility on December 7, eliquis discontinued who presented with acute Ischemic CVA and hemorrhagic conversion.            Follow up for CVA and colitis  12/14 pt is mildly confused, having difficulty speaking, expressive asphasia, RN states last diarrhea was yesterday, pt with n/v this afternoon    12/15: n/v resolved.  Still having expressive aphasia/wordfinding.  No diarrhea         Objective :  Vitals:    02/28/17 1927 02/28/17 1929 02/28/17 2346 03/01/17 0428   BP:  103/71 (!) 86/51 97/67   Pulse:  67 64 72   Resp:  17 17 16    Temp: 100.4 F (38 C) 97.7 F (36.5 C) 97.7 F (36.5 C) 98.2 F (36.8 C)   TempSrc: Oral Oral Oral Axillary   SpO2:  90% 90% 90%   Weight:       Height:         No intake or output data in the 24 hours ending 03/01/17 0740  Weight Monitoring 02/20/2017 02/24/2017 02/24/2017   Height - 154.9 cm 154.9 cm   Height Method - Stated -   Weight 50.576 kg 50.6 kg 50.6 kg   Weight Method Standing Scale - -   BMI (calculated) - 21.1 kg/m2 21.1 kg/m2       Body mass index is 21.09 kg/m.        MEDS: (SCHEDULED/INFUSIONS/PRN)  Current Facility-Administered Medications   Medication Dose Route Frequency   . aspirin  81 mg Oral Daily   . atorvastatin  80 mg Oral QHS   . cephALEXin  500 mg Oral TID   . dilTIAZem  240 mg Oral Daily   . fluticasone  1 spray Each Nare Daily   . latanoprost  1 drop Both Eyes QHS   . levothyroxine  112 mcg Oral QAM   . metroNIDAZOLE  500 mg Oral Q8H SCH   . sodium chloride (PF)  3 mL Intravenous Q8H     Current Facility-Administered Medications   Medication Dose Route Frequency Last Rate     Current Facility-Administered Medications    Medication Dose Route   . acetaminophen  650 mg Oral    Or   . acetaminophen  650 mg per NG tube    Or   . acetaminophen  650 mg Rectal   . hydrALAZINE  10 mg Intravenous   . loperamide  1 mg Oral   . LORazepam  0.5 mg Intravenous   . metoprolol tartrate  5 mg Intravenous   . naloxone  0.4 mg Intravenous            LABS:    Recent CBC:    Recent Labs  Lab 02/28/17  0528 02/25/17  0344   WBC 8.7 7.8   RBC 3.68* 3.90   Hemoglobin 12.6 13.9   Hematocrit 39.1 41.5   MCV 106* 106*   PLT CT 191 179       Recent BMP:    Recent Labs  Lab 03/01/17  0556 02/28/17  0528   Sodium 132* 131*   Potassium 3.5 3.3*  Chloride 107 105   CO2 16.4* 17.9*   BUN 27* 21   Creatinine 0.71 0.66   Glucose 88 95   EGFR 78 81   Calcium 8.8 8.7       Other Labs (if any):  LFTs     -     Recent Labs  Lab 03/01/17  0556   Magnesium 1.7        Recent Labs  Lab 02/24/17  1019   ALT 16   AST (SGOT) 38   Bilirubin, Total 1.0   Albumin 4.1   Alkaline Phosphatase 81     Cardiac -     PT/PTT -            IMAGING STUDIES:  Ct Angiogram Head    Result Date: 02/25/2017  Normal CTA head. ReadingStation:[id number]    Xr Knee 1 Or 2 Views Right    Result Date: 02/26/2017  There is a large joint effusion and linear sclerosis along the lateral aspect of the proximal tibial metaphysis which is questionable for a minimally impacted fracture. Further evaluation with cross-sectional imaging should be considered when clinically feasible. MRI would be the modality of choice as it would allow for further characterization of the calcifications along the lateral joint line which could be secondary to atherosclerosis or a meniscal tear with a small amount of extruded meniscal tissue. ReadingStation:[email or web address]    Ct Head Wo Contrast    Result Date: 02/24/2017  Subarachnoid hemorrhage in left frontal lobe, with surrounding vasogenic edema, loss of gray-white matter differentiation, mild effacement of left lateral ventricle, and 3 mm left-to-right midline shift. This  mostly involves the region of Broca's area, which may explain the patient's difficulty with speech articulation. An underlying mass cannot be excluded. A stat MRI of the brain with contrast is recommended for further characterization. Findings were discussed with Dr. Nunzio Cory in the emergency room at 12:50pm on the 10th of December, 2018. ReadingStation:[email or web address]    Ct Angiogram Neck    Result Date: 02/25/2017  Abnormal CT angiogram of the neck demonstrating: 1. On the right the internal carotid is moderate stenosis of approximately 60%. The right vertebral artery is patent. 2. On the left there is mild narrowing the proximal internal carotid artery. The left vertebral artery is patent. ReadingStation:[id number]    Ct Knee Right Wo Contrast    Result Date: 02/26/2017  1.  Osteoporosis, marked cartilage loss in the lateral compartment, and chronic-appearing trabecular impaction lateral tibial plateau without lucent fracture line or depression. 2.  Large effusion and popliteal cyst. Chondrocalcinosis may be related to underlying hypercalcemia or CPPD.  ReadingStation:[email or web address]    Mri Brain W Wo Contrast    Result Date: 02/24/2017  Abnormal MRI scan of the head demonstrating: 1. There is an acute cortical infarction of the left operculofrontal region with hemorrhagic features without significant mass effect 2. There is a chronic microhemorrhage in the left thalamus 3. Moderate cortical and subcortical atrophy. 4. Mild chronic microvascular ischemic disease. ReadingStation:[id number]         Physical Exam:  Vitals:  T:98.2 F (36.8 C) (Axillary)   BP:97/67, HR:72, RR:16, SaO2:90%  General: Awake, Alert. No Acute Distress.  HEENT:  NCAT, MMM, EOMI  Cardiovascular: RRR. No murmurs. Brisk cap refill  Respiratory: CTAB. No rales, rhonchi or retractions.  Abdomen/GI: Nontender, nondistended. Normoactive BS  Integument: Skin intact, no bruises.  Extremities: Right knee effusion with improved range of motion  Neurologic:  Expressive aphasia, no facial droop, mild right-sided weakness.  Psychiatric: AA, cooperative.       ASSESSMENT AND PLAN:    # Acute ischemic stroke with hemorrhagic conversion.   On low-dose aspirin per neurology.   Hold eliquis until repeat head CT is done in 3 weeks.   Continue Lipitor. PT/OT/ST. Diet per ST.  Echo:    Conclusions        1: The left ventricular ejection fraction is mildly decreased, estimated at 40-45%.                       2: Unable to assess diastolic function due to atrial fibrillation.                       3: The right ventricular size is moderately increased.                       4: The right ventricular systolic function is mildly decreased.                       5: The left atrium is moderately enlarged.                       6: The right atrium is severely enlarged.                       7: RVSP normal at 25-30 mmHg.       # chronic systolic CHF  bblocker stopped due to bradycardia   Not currently on lasix as not acute CHF  No ACE due to allergy    # Colitis: Unclear etiology. Resolving. Continue empirical Cipro and Flagyl. Imodium when necessary for diarrhea  cipro stopped due to prolonged QT  Started keflex instead along with flagyl  Diarrhea resolving  negative c diff    # Hypertension: BP soft, Continue to hold outpatient medications    # Hypothyroidism: On Synthroid    # Atrial fibrillation: Continue to hold eliquis. Change cardizem to short-acting.  Hold metoprolol.   Cont tele.     # Right knee effusion with a possible old tibial fracture:  orthopedics following. Supportive care    # prolonged QTc 516, cipro stopped.  Repeat EKG.    Monitor on tele    Disposition: SNF next week      Signed by:  Marlou Sa, MD  Please contact at phone number [phone] for any questions.

## 2017-03-01 NOTE — SLP Progress Note (Signed)
[facility]  Speech Language Pathology   Daily Progress Note    Patient: Kristina [name]    CSN#: [id number]    ASSESSMENT/PLAN/RECOMMENDATIONS:     SLP diagnosis:Mild Oropharyngeal Dysphagia, Severe Broca's aphasia    Diet recommendations: Regular diet/Thin liquids              Additional restrictions:  Gluten restricted per pt diagnosis/medical hx and discussion with nurse.    Projected Discharge Recommendations:  Patient will need ongoing SLP at next level of care    Prognosis:  Fair d/t, severity of deficits and advanced age/disease process    x Continue therapy for: 3-5x/wk (M-F), pt/family training, swallowing tx, speech/language tx    Discontinue therapy        Goals:  STG:  (in 3-4 days)  1. Pt will tolerate a REGULAR diet with THIN liquids free of signs/symptoms of aspiration. (Ongoing)  2. Pt will follow simple 2-step directions with 50% accuracy. (Ongoing)  3. Pt will ID objects from field of 2 with 50% accuracy. (MET)  4. Pt will name objects with 25% accuracy independently. (Ongoing)  5. Pt/family/staff education (Ongoing)  LTG:  (By discharge)  1. Pt will tolerate the least restrictive diet free of signs/symptoms of aspiration.  (Ongoing)  2. Pt will be mod assist for communication. (Ongoing)    SUBJECTIVE:      Subjective: Patient is agreeable to participation in the therapy session. Family and/or guardian are agreeable to patient's participation in the therapy session. Nursing clears patient for therapy. Patient's medical condition is appropriate for Speech therapy intervention at this time.    S:  "Well, you know, Its ok." ( SLP arrived at room as pt was finishing lunch.)       OBJECTIVE:     O: SLP Treatment:  Pt was seen for SLP tx with progress as follows:    1. SLP observed pt taking straw sips of thin and eating soup for lunch today. No s/s of aspiration w/ these consistencies noted today. Family reports intermittent cough while eating, although none was observed today during session.    2. Pt able to follow 2 step functional directions with 50% accuracy given repetition, performance decreased to 25% accuracy  with more novel directions, such as "touch your ear then touch your nose."   3. Pt able to receptively ID objects from a field of 2 today with 100% accuracy. For example, "Point to the brush". Pt unable to name objects.   4. Pt unable to name any objects today 0/10. Pt given carrier phrase but did not assist with naming task, pt unable to repeat. Pt unable to use description strategies or gestures. Pt showed inc frustration and became tearful. Pt is able to i'ly count to 10 but unable to name days of the week.     Nsg and family reports periods of more fluent speech today. Pt attempted to tell me about her family. Per pt," He lives in [place], [place]. I traveled home by myself. I have no one."  Pt emotional and tearful.       Pt/Family/Caregiver Education Provided:     Patient, family was/were educated re: progress on goals, prognosis for improvement  Good understanding was verbalized/demonstrated: no, family yes   Comprehension limited by: aphasia    Team Communication:     Spoke to : RN/LPN Velna Hatchet  Regarding: tx goals, progress on goals  Good understanding was verbalized: yes    G-CODES:  Spoken Language Expressive G Code Set  Spoken Language Expression, Current Status [id number]): At least 60 percent but less than 80 percent impaired, limited or restricted  Spoken Language Expression, Goal Status [id number]): At least 40 percent but less than 60 percent impaired, limited or restricted                                  Time of treatment:   SLP Received On: 03/01/17  Start Time: 1250  Stop Time: 1319  Time Calculation (min): 29 min    SLP Visit Number: 2      Completed by:  Luetta Nutting. Chessica Audia, M.S., CCC-SLP [phone]  Speech Language Pathologist  [facility]

## 2017-03-01 NOTE — Plan of Care (Signed)
Problem: Moderate/High Fall Risk Score >5  Goal: Patient will remain free of falls  Outcome: Progressing

## 2017-03-01 NOTE — Progress Notes (Signed)
Assumed care of pt at 1900 this pm, resting in bed quietly, awake and alert, denies needs/wants, callbell in reach, will moniter

## 2017-03-01 NOTE — Progress Notes (Signed)
Pharmacy IV to PO Conversion Note: Lorazepam    Previous IV dose: 0.5 mg q4h PRN anxiety, nausea  New oral dose: 0.5 mg q4h PRN anxiety, nausea      1.  The patient met the following conversion criteria and can receive oral therapy.    Able to eat or tolerate enteral feeding OR   Receiving enteral nutrition by the oral, gastric, or nasogastric tube OR   Receiving other scheduled medications by the oral route    2.  Patient does not have any of the following:    Seizures   Inability to swallow, refuses oral medication or is strict NPO   Malabsorption syndrome   Unresolved ileus or complete bowel obstruction       If you have any questions, please contact the pharmacist at [phone].      [facility] Pharmacy Department

## 2017-03-01 NOTE — Plan of Care (Signed)
Problem: Every Day - Stroke  Goal: Stable vital signs and fluid balance  Outcome: Progressing   02/26/17 2313   Goal/Interventions addressed this shift   Stable vital signs and fluid balance  Position patient for maximum circulation/cardiac output;Monitor and assess vitals every 4 hours or as ordered and hemodynamic parameters;Monitor intake and output. Notify LIP if urine output is < 30 mL/hour.;Encourage oral fluid intake;Apply telemetry monitor as ordered

## 2017-03-02 LAB — CBC AND DIFFERENTIAL
Basophils %: 0.7 % (ref 0.0–3.0)
Basophils Absolute: 0.1 10*3/uL (ref 0.0–0.3)
Eosinophils %: 0.9 % (ref 0.0–7.0)
Eosinophils Absolute: 0.1 10*3/uL (ref 0.0–0.8)
Hematocrit: 40.6 % (ref 36.0–48.0)
Hemoglobin: 13.7 gm/dL (ref 12.0–16.0)
Lymphocytes Absolute: 2 10*3/uL (ref 0.6–5.1)
Lymphocytes: 22.5 % (ref 15.0–46.0)
MCH: 36 pg — ABNORMAL HIGH (ref 28–35)
MCHC: 34 gm/dL (ref 32–36)
MCV: 105 fL — ABNORMAL HIGH (ref 80–100)
MPV: 8.6 fL (ref 6.0–10.0)
Monocytes Absolute: 1.4 10*3/uL (ref 0.1–1.7)
Monocytes: 15.8 % — ABNORMAL HIGH (ref 3.0–15.0)
Neutrophils %: 60.1 % (ref 42.0–78.0)
Neutrophils Absolute: 5.2 10*3/uL (ref 1.7–8.6)
PLT CT: 250 10*3/uL (ref 130–440)
RBC: 3.88 10*6/uL (ref 3.80–5.00)
RDW: 13.5 % (ref 11.0–14.0)
WBC: 8.7 10*3/uL (ref 4.0–11.0)

## 2017-03-02 LAB — BASIC METABOLIC PANEL
Anion Gap: 11.8 mMol/L (ref 7.0–18.0)
BUN / Creatinine Ratio: 30.6 Ratio — ABNORMAL HIGH (ref 10.0–30.0)
BUN: 19 mg/dL (ref 7–22)
CO2: 17.6 mMol/L — ABNORMAL LOW (ref 20.0–30.0)
Calcium: 8.4 mg/dL — ABNORMAL LOW (ref 8.5–10.5)
Chloride: 107 mMol/L (ref 98–110)
Creatinine: 0.62 mg/dL (ref 0.60–1.20)
EGFR: 82 mL/min/{1.73_m2} (ref 60–150)
Glucose: 87 mg/dL (ref 71–99)
Osmolality Calc: 268 mOsm/kg — ABNORMAL LOW (ref 275–300)
Potassium: 3.4 mMol/L — ABNORMAL LOW (ref 3.5–5.3)
Sodium: 133 mMol/L — ABNORMAL LOW (ref 136–147)

## 2017-03-02 MED ORDER — DILTIAZEM HCL 60 MG PO TABS
60.00 mg | ORAL_TABLET | Freq: Four times a day (QID) | ORAL | Status: DC
Start: 2017-03-03 — End: 2017-03-04
  Administered 2017-03-03 – 2017-03-04 (×5): 60 mg via ORAL
  Filled 2017-03-02 (×10): qty 1

## 2017-03-02 MED ORDER — VH POTASSIUM CHLORIDE CRYS ER 20 MEQ PO TBCR (WRAP)
40.00 meq | EXTENDED_RELEASE_TABLET | Freq: Once | ORAL | Status: AC
Start: 2017-03-02 — End: 2017-03-02
  Administered 2017-03-02: 18:00:00 40 meq via ORAL
  Filled 2017-03-02 (×2): qty 2

## 2017-03-02 NOTE — Progress Note - Problem Oriented Charting Notewrit (Signed)
Date Time:  03/02/17 6:01 PM Attending: Marlou Sa, MD   Pt. Name:  Kristina [name]     PCP: Patsy Lager, MD              83 year old female with a history of A. fib, on eliquis, who was recently admitted for bloody colitis, discharged from this facility on December 7, eliquis discontinued who presented with acute Ischemic CVA and hemorrhagic conversion.            Follow up for CVA and colitis  12/14 pt is mildly confused, having difficulty speaking, expressive asphasia, RN states last diarrhea was yesterday, pt with n/v this afternoon    12/15: n/v resolved.  Still having expressive aphasia/wordfinding.  No diarrhea    12/16: + diarrhea         Objective :  Vitals:    03/02/17 1139 03/02/17 1301 03/02/17 1541 03/02/17 1744   BP: 141/78  141/88 (!) 159/95   Pulse: (!) 111 93 87 (!) 119   Resp: 16  17    Temp: 97.5 F (36.4 C)  97.5 F (36.4 C)    TempSrc: Oral  Oral    SpO2: 95%  98%    Weight:       Height:         No intake or output data in the 24 hours ending 03/02/17 1801  Weight Monitoring 02/20/2017 02/24/2017 02/24/2017   Height - 154.9 cm 154.9 cm   Height Method - Stated -   Weight 50.576 kg 50.6 kg 50.6 kg   Weight Method Standing Scale - -   BMI (calculated) - 21.1 kg/m2 21.1 kg/m2       Body mass index is 21.09 kg/m.        MEDS: (SCHEDULED/INFUSIONS/PRN)  Current Facility-Administered Medications   Medication Dose Route Frequency   . aspirin  81 mg Oral Daily   . atorvastatin  80 mg Oral QHS   . cephALEXin  500 mg Oral TID   . [START ON 03/03/2017] dilTIAZem  60 mg Oral 4 times per day   . fluticasone  1 spray Each Nare Daily   . latanoprost  1 drop Both Eyes QHS   . levothyroxine  112 mcg Oral QAM   . metroNIDAZOLE  500 mg Oral Q8H SCH   . sodium chloride (PF)  3 mL Intravenous Q8H     Current Facility-Administered Medications   Medication Dose Route Frequency Last Rate     Current Facility-Administered Medications   Medication Dose  Route   . acetaminophen  650 mg Oral    Or   . acetaminophen  650 mg per NG tube    Or   . acetaminophen  650 mg Rectal   . hydrALAZINE  10 mg Intravenous   . loperamide  1 mg Oral   . LORazepam  0.5 mg Oral   . metoprolol tartrate  5 mg Intravenous   . naloxone  0.4 mg Intravenous            LABS:    Recent CBC:    Recent Labs  Lab 03/02/17  0344 02/28/17  0528   WBC 8.7 8.7   RBC 3.88 3.68*   Hemoglobin 13.7 12.6   Hematocrit 40.6 39.1   MCV 105* 106*   PLT CT 250 191       Recent BMP:    Recent Labs  Lab 03/02/17  0344 03/01/17  0556   Sodium 133* 132*   Potassium  3.4* 3.5   Chloride 107 107   CO2 17.6* 16.4*   BUN 19 27*   Creatinine 0.62 0.71   Glucose 87 88   EGFR 82 78   Calcium 8.4* 8.8       Other Labs (if any):  LFTs     -     Recent Labs  Lab 03/01/17  0556   Magnesium 1.7        Recent Labs  Lab 02/24/17  1019   ALT 16   AST (SGOT) 38   Bilirubin, Total 1.0   Albumin 4.1   Alkaline Phosphatase 81     Cardiac -     PT/PTT -            IMAGING STUDIES:  Ct Angiogram Head    Result Date: 02/25/2017  Normal CTA head. ReadingStation:[id number]    Xr Knee 1 Or 2 Views Right    Result Date: 02/26/2017  There is a large joint effusion and linear sclerosis along the lateral aspect of the proximal tibial metaphysis which is questionable for a minimally impacted fracture. Further evaluation with cross-sectional imaging should be considered when clinically feasible. MRI would be the modality of choice as it would allow for further characterization of the calcifications along the lateral joint line which could be secondary to atherosclerosis or a meniscal tear with a small amount of extruded meniscal tissue. ReadingStation:[email or web address]    Ct Head Wo Contrast    Result Date: 02/24/2017  Subarachnoid hemorrhage in left frontal lobe, with surrounding vasogenic edema, loss of gray-white matter differentiation, mild effacement of left lateral ventricle, and 3 mm left-to-right midline shift. This mostly involves the  region of Broca's area, which may explain the patient's difficulty with speech articulation. An underlying mass cannot be excluded. A stat MRI of the brain with contrast is recommended for further characterization. Findings were discussed with Dr. Nunzio Cory in the emergency room at 12:50pm on the 10th of December, 2018. ReadingStation:[email or web address]    Ct Angiogram Neck    Result Date: 02/25/2017  Abnormal CT angiogram of the neck demonstrating: 1. On the right the internal carotid is moderate stenosis of approximately 60%. The right vertebral artery is patent. 2. On the left there is mild narrowing the proximal internal carotid artery. The left vertebral artery is patent. ReadingStation:[id number]    Ct Knee Right Wo Contrast    Result Date: 02/26/2017  1.  Osteoporosis, marked cartilage loss in the lateral compartment, and chronic-appearing trabecular impaction lateral tibial plateau without lucent fracture line or depression. 2.  Large effusion and popliteal cyst. Chondrocalcinosis may be related to underlying hypercalcemia or CPPD.  ReadingStation:[email or web address]    Mri Brain W Wo Contrast    Result Date: 02/24/2017  Abnormal MRI scan of the head demonstrating: 1. There is an acute cortical infarction of the left operculofrontal region with hemorrhagic features without significant mass effect 2. There is a chronic microhemorrhage in the left thalamus 3. Moderate cortical and subcortical atrophy. 4. Mild chronic microvascular ischemic disease. ReadingStation:[id number]         Physical Exam:  Vitals:  T:97.5 F (36.4 C) (Oral)   BP:(!) 159/95, HR:(!) 119, RR:17, SaO2:98%  General: Awake, Alert. No Acute Distress.  HEENT:  NCAT, MMM, EOMI  Cardiovascular: RRR. No murmurs. Brisk cap refill  Respiratory: CTAB. No rales, rhonchi or retractions.  Abdomen/GI: Nontender, nondistended. Normoactive BS  Integument: Skin intact, no bruises.  Extremities: Right knee effusion with improved range of motion  Neurologic: Expressive  aphasia, no  facial droop, mild right-sided weakness.  Psychiatric: AA, cooperative.       ASSESSMENT AND PLAN:    # Acute ischemic stroke with hemorrhagic conversion.   On low-dose aspirin per neurology.   Hold eliquis until repeat head CT is done in 3 weeks.   Continue Lipitor. PT/OT/ST. Diet per ST.  Echo:    Conclusions        1: The left ventricular ejection fraction is mildly decreased, estimated at 40-45%.                       2: Unable to assess diastolic function due to atrial fibrillation.                       3: The right ventricular size is moderately increased.                       4: The right ventricular systolic function is mildly decreased.                       5: The left atrium is moderately enlarged.                       6: The right atrium is severely enlarged.                       7: RVSP normal at 25-30 mmHg.       # chronic systolic CHF  bblocker stopped due to bradycardia   Not currently on lasix as not acute CHF  No ACE due to allergy    # Colitis: Unclear etiology. negative c diff.  D/c abx.    # Hypertension: BP soft, Continue to hold outpatient medictions    # Hypothyroidism: On Synthroid    # Atrial fibrillation: Continue to hold eliquis. Titrate cardizem.  Metoprolol held.   Cont tele.     # Right knee effusion with a possible old tibial fracture:  orthopedics following. Supportive care    # prolonged QTc 516, resolved.    # hypokalemia: replace.    Disposition: SNF next week      Signed by:  Marlou Sa, MD  Please contact at phone number [phone] for any questions.

## 2017-03-02 NOTE — Progress Notes (Signed)
Awakens easily, no acute changes in assessment noted, bed alarm on, will moniter

## 2017-03-02 NOTE — Plan of Care (Signed)
Problem: Every Day - Stroke  Goal: Neurological status is stable or improving  Outcome: Progressing   03/02/17 1444   Goal/Interventions addressed this shift   Neurological status is stable or improving Monitor/assess/document neurological assessment (Stroke: every 4 hours);Monitor/assess NIH Stroke Scale     Goal: Stable vital signs and fluid balance  Outcome: Progressing   03/02/17 1444   Goal/Interventions addressed this shift   Stable vital signs and fluid balance  Monitor and assess vitals every 4 hours or as ordered and hemodynamic parameters;Apply telemetry monitor as ordered

## 2017-03-03 ENCOUNTER — Inpatient Hospital Stay: Payer: Medicare Other

## 2017-03-03 MED ORDER — PREDNISONE 20 MG PO TABS
20.00 mg | ORAL_TABLET | Freq: Every morning | ORAL | Status: DC
Start: 2017-03-03 — End: 2017-03-04
  Administered 2017-03-03 – 2017-03-04 (×2): 20 mg via ORAL
  Filled 2017-03-03 (×2): qty 1

## 2017-03-03 MED ORDER — METHOTREXATE SODIUM 2.5 MG PO TABS
10.00 mg | ORAL_TABLET | ORAL | Status: DC
Start: 2017-03-03 — End: 2017-03-04
  Administered 2017-03-03: 18:00:00 10 mg via ORAL
  Filled 2017-03-03: qty 4

## 2017-03-03 NOTE — Plan of Care (Signed)
Problem: Every Day - Stroke  Goal: Neurological status is stable or improving  Outcome: Progressing   03/02/17 1444   Goal/Interventions addressed this shift   Neurological status is stable or improving Monitor/assess/document neurological assessment (Stroke: every 4 hours);Monitor/assess NIH Stroke Scale

## 2017-03-03 NOTE — Progress Notes (Signed)
MOBILITY NOTE:     Precautions: Weight Bearing: Full weight bearing     Activity:  Dangled on side of bed for 5 minutes     Level of Assistance: Minimal assist (patient does 75% or more of the task)     Device(s) used: No device     Positioning frequency: Turns self as needed    Last Vitals: BP 116/77   Pulse 99   Temp 98.2 F (36.8 C)   Resp 14   Ht 1.549 m (5' 0.98")   Wt 50.6 kg (111 lb 8.8 oz)   SpO2 94%   BMI 21.09 kg/m     Assisted patient to the EOB. Patient went to stand and complained of left leg pain patient would not bear weight onto left leg. Nurse aware of leg pain. Patient is back in bed needs are in reach bed alarm is on .

## 2017-03-03 NOTE — PT Plan of Care Note (Signed)
[facility]  [facility]  Department of Rehabilitation Services  [phone]  BABE ANTHIS CSN: [id number]  NEURO SURGICAL [id number]    Physical Therapy General Note  Time: 1000    Last seen by a Physical Therapist vs. PTA: 02/25/17    COTA informing patient this AM that patient has x-ray pending due to difficulty weight bearing on L foot.    Team Communication: Milas Hock    Plan: Will follow up as schedule allows per POC.     Duard Brady, LPTA

## 2017-03-03 NOTE — Progress Notes (Signed)
[facility]  [address]  [place], [place] [address]  [facility]: [phone]   FX: [phone]    NEUROLOGY PROGRESS NOTE    Date / Time: 03/03/17 2:36 PM  Patient Name: Kristina [name]  Attending Physician: Marlou Sa, MD      CHIEF COMPLAINT:     Chief Complaint   Patient presents with   . Diarrhea       HISTORY OF PRESENT ILLNESS:   Kristina [name] XBM:[id number], MRN: [id number], is a 83 y.o. female with history of atrial fibrillation, hypothyroidIism, HTN, brought to ED for diarrhea and dehydration. Patient and family also reported difficulty speaking that was noticed when patient woke up around 7AM.     CT head initially read as left frontal lobe subarachnoid hemorrhage with edema and midline shift.   MRI brain was performed to further evaluate, and it was determined that abnormality was actually ischemic infarct with hemorrhagic conversion.     Patient was previously on Eliquis for a fib, but this was stopped during hospitalization last week for rectal bleed. DOAC stopped 02/21/17. Plan was to restart 12/10, until imaging revealed hemorrhage.     ECHO performed 02/21/17 shows 35-40% EF, severe tricuspid regurgitation, moderate to severe pulmonic insufficiency, as well as other mild to moderate abnormalities.     12/11-No neuro changes overnight. Continues to have aphasia.    12/13- xray ordered for [name] knee pain and swelling, concerning for possible fracture. CT with large effusion and popliteal cyst, as well as osteoporosis and chronic trabecular impaction lateral tibial plateau.    12/17: Neurology signed off on 12/13. Received call from RN; concerned about patient's confusion, was told in report that patient was alert and oriented. I went to see patient, and she was aphasic, similar to how she presented in ED, but mildly improved ability to follow commands. Patient getting up to side of bed to walk with CNA. She has difficulty with word finding.      REVIEW OF SYSTEMS:   Patient with  expressive aphasia, unable to provide ROS.      CURRENT PROBLEM LIST:     Active Hospital Problems    Diagnosis   . Hypothyroidism   . Chronic systolic (congestive) heart failure   . Cerebrovascular accident (CVA) with intracranial hemorrhage   . Acute CVA (cerebrovascular accident)   . PAF (paroxysmal atrial fibrillation)   . HTN (hypertension)       PAST MEDICAL HISTORY:     Past Medical History:   Diagnosis Date   . A-fib    . Hypertension    . Melanoma        FAMILY HISTORY:   History reviewed. No pertinent family history.     SOCIAL HISTORY:     Social History     Social History   . Marital status: Widowed     Spouse name: N/A   . Number of children: N/A   . Years of education: N/A     Social History Main Topics   . Smoking status: Never Smoker   . Smokeless tobacco: Never Used   . Alcohol use Yes   . Drug use: No   . Sexual activity: Not Asked     Other Topics Concern   . None     Social History Narrative   . None       ALLERGIES:     Allergies   Allergen Reactions   . Lisinopril Anaphylaxis       MEDICATIONS:  Prior to Admission medications    Medication Sig Start Date End Date Taking? Authorizing Provider   amoxicillin-clavulanate (AUGMENTIN) 875-125 MG per tablet Take 1 tablet by mouth 2 (two) times daily.for 6 days 02/21/17 02/27/17 Yes Theda Sers, MD   dilTIAZem (CARDIZEM CD) 240 MG 24 hr capsule Take 240 mg by mouth every morning.     01/31/17  Yes [provider]   folic acid (FOLVITE) 1 MG tablet Take 1 mg by mouth daily.   Yes [provider]   furosemide (LASIX) 20 MG tablet Take 1 tablet (20 mg total) by mouth daily.for 7 days 02/21/17 02/28/17 Yes Theda Sers, MD   lactase (LACTAID) 3000 units tablet Take 1 tablet by mouth 3 (three) times daily with meals.   Yes [provider]   lactobacillus species (BIO-K PLUS) capsule Take 1 capsule (50 Billion CFU total) by mouth daily. 02/22/17 03/24/17 Yes Theda Sers, MD   latanoprost (XALATAN) 0.005 % ophthalmic solution Place 1  drop into both eyes nightly.       Yes [provider]   levothyroxine (SYNTHROID, LEVOTHROID) 112 MCG tablet Take 112 mcg by mouth every morning.     01/20/17  Yes [provider]   LUMIGAN 0.01 % Solution Place 1 drop into both eyes nightly. 01/31/17  Yes [provider]   metoprolol tartrate (LOPRESSOR) 25 MG tablet Take 25 mg by mouth 2 (two) times daily.     01/31/17  Yes [provider]   potassium chloride (K-TAB, KLOR-CON) 10 MEQ tablet Take 2 tablets (20 mEq total) by mouth daily.for 7 days 02/21/17 02/28/17 Yes Theda Sers, MD   [facility] 1-0.2 % Suspension Place 1 drop into both eyes every morning. 01/31/17  Yes [provider]   fluticasone (FLONASE) 50 MCG/ACT nasal spray 1 spray by Nasal route daily as needed for Rhinitis or Allergies.        [provider]   methotrexate 2.5 MG tablet Take 10 mg by mouth Once a week on Monday evening.     01/21/17   [provider]   nitroglycerin (NITROSTAT) 0.4 MG SL tablet Place 0.4 mg under the tongue every 5 (five) minutes as needed for Chest pain.     01/31/17   [provider]   PROAIR HFA 108 (90 Base) MCG/ACT inhaler Inhale 2 puffs into the lungs 2 (two) times daily as needed for Wheezing or Shortness of Breath.     12/31/16   [provider]               PHYSICAL EXAM:     Vitals:    03/03/17 1146   BP: 131/83   Pulse: 93   Resp: 20   Temp: 97.5 F (36.4 C)   SpO2: 98%       General: Patient is a 83 year old female, in no acute distress.   Mental status: Awake, alert, pleasantly confused. Continues to have significant expressive and receptive aphasia that is slightly improved.  Cranial Nerve Exam:  CN 2 Normal vision without visual field defects   CN 3,4,6 EOMs intact without nystagmus, PERRLA  CN 5 Facial sensation is symmetrical.  CN 7 Spontaneous facial expressions are symmetrical.  CN 8 Auditory acuity intact bilaterally to casual conversation.  CN 9 Palate rises with  phonation.  CN 11 Trapezius and sternocleidomastoids intact.  CN 12 Tongue protrudes in midline.  Motor: Power is 5/5 in upper extremities, 4+/5 lower extremities. With normal tone and  bulk for age. RLE is limited by pain.  Sensory:  Sensation intact to light touch throughout.   Coordination: Patient does not follow commands for rapid alternating movements.   DTRs: 2+ and symmetric bilaterally  Gait: not tested    HEENT:  Head is midline, normocephalic, atraumatic.   Cardiac: irregularly irregular rhythm  Chest:  Breath sounds CTA bilaterally.  Abd:  Bowel sounds normoactive in all quadrants.    Extremities:  No deformities, cyanosis, clubbing or edema. No new rash or bruise.    SIGNIFICANT LABS:     Results     ** No results found for the last 24 hours. **          SIGNIFICANT IMAGING:     XR Ankle Left AP And Lateral   Final Result   Nonspecific soft tissue swelling, and degenerative changes. Vascular calcification noted. Chronic degenerative changes. Lucency of navicular bone nonspecific but could represent chronic osteomyelitis. If there is clinical concern, MRI would be modality    of choice.      ReadingStation:[email or web address]      XR Chest AP Portable   Final Result   IMPRESSION: Recurrent moderate right pleural effusion with middle and lower lobe atelectasis. Correlate clinically to exclude pneumonia.      ReadingStation:[email or web address]      Echo Doppler Waveform Complete   Final Result      CT Knee Right WO Contrast   Final Result   1.  Osteoporosis, marked cartilage loss in the lateral compartment, and chronic-appearing trabecular impaction lateral tibial plateau without lucent fracture line or depression.   2.  Large effusion and popliteal cyst. Chondrocalcinosis may be related to underlying hypercalcemia or CPPD.             ReadingStation:[email or web address]      XR Knee 1 Or 2 Views Right   Final Result   There is a large joint effusion and linear sclerosis along the lateral aspect of the proximal tibial metaphysis which is  questionable for a minimally impacted fracture. Further evaluation with cross-sectional imaging should be considered when clinically    feasible. MRI would be the modality of choice as it would allow for further characterization of the calcifications along the lateral joint line which could be secondary to atherosclerosis or a meniscal tear with a small amount of extruded meniscal    tissue.      ReadingStation:[email or web address]      CT Angiogram Head   Final Result   Normal CTA head.      ReadingStation:[id number]      CT Angiogram Neck   Final Result   Abnormal CT angiogram of the neck demonstrating:   1. On the right the internal carotid is moderate stenosis of approximately 60%. The right vertebral artery is patent.   2. On the left there is mild narrowing the proximal internal carotid artery. The left vertebral artery is patent.      ReadingStation:[id number]      MRI Brain W WO Contrast   Final Result   Abnormal MRI scan of the head demonstrating:   1. There is an acute cortical infarction of the left operculofrontal region with hemorrhagic features without significant mass effect   2. There is a chronic microhemorrhage in the left thalamus   3. Moderate cortical and subcortical atrophy.   4. Mild chronic microvascular ischemic disease.      ReadingStation:[id number]      CT Head WO Contrast   Final Result   Subarachnoid hemorrhage in left  frontal lobe, with surrounding vasogenic edema, loss of gray-white matter differentiation, mild effacement of left lateral ventricle, and 3 mm left-to-right midline shift. This mostly involves the region of Broca's area,    which may explain the patient's difficulty with speech articulation. An underlying mass cannot be excluded. A stat MRI of the brain with contrast is recommended for further characterization.      Findings were discussed with Dr. Nunzio Cory in the emergency room at 12:50pm on the 10th of December, 2018.      ReadingStation:[email or web address]      CT Head WO Contrast     (Results Pending)           ASSESSMENT AND PLAN:   Kristina [name], 83 year old female initially brought to hospital with diarrhea and dehydration, had imaging for expressive aphasia, found to have cerebral infarction with hemorrhagic conversion.    Was called to evaluate patient for concerns of new confusion. Upon assessment, patient is unchanged from previous assessments. Able to follow some commands, still with expressive aphasia.  No further neurologic workup indicated.     1. Diagnostics:  1. MRI brain reports left frontal cerebral infarction with hemorrhagic conversion.   2. ECHO completed 02/21/17. No need to repeat.   3. CTA head/neck with [name] ICA ~60%.   4. Neuro checks and vital signs q4h  5. Telemetry.  6. Repeat CT head 3 weeks(has been ordered)  2. Labs:   1. Cholesterol 137, HDL 33, LDL 86, TG 90, B12 493, folate 16.8, HA1c 5.7  3. Medications:   1. Hold Eliquis until repeat CT head in 3 weeks. Continue low dose ASA.  2. Atorvastatin 80 mg.   4. Rehabilitation services:   1. PT/OT  2. SLP    She will need to follow up with neurology in 4 weeks following CT head to determine if safe to restart Eliquis.   Neurology signing off, please call with further questions.     Case discussed with Dr. Alvino Chapel who will additionally review diagnostics/labs, personally evaluate the patient, further determine the treatment plan, and amend this note as indicated.     Kristina [name], AGAC-NP  03/03/2017  2:36 PM  [phone]    I have seen and evaluated her.  She and daughter agree she is better.  Agree with exam. Will sign off.  Burman Foster, MD MPH

## 2017-03-03 NOTE — Progress Note - Problem Oriented Charting Notewrit (Signed)
Date Time:  03/03/17 9:43 AM Attending: Marlou Sa, MD   Pt. Name:  Kristina [name]     PCP: Patsy Lager, MD              83 year old female with a history of A. fib, on eliquis, who was recently admitted for bloody colitis, discharged from this facility on December 7, eliquis discontinued who presented with acute Ischemic CVA and hemorrhagic conversion.            Follow up for CVA and colitis  12/14 pt is mildly confused, having difficulty speaking, expressive asphasia, RN states last diarrhea was yesterday, pt with n/v this afternoon    12/15: n/v resolved.  Still having expressive aphasia/wordfinding.  No diarrhea    12/16: + diarrhea    12/17: Patient reported left ankle pain overnight.         Objective :  Vitals:    03/03/17 0456 03/03/17 0508 03/03/17 0751 03/03/17 0806   BP:  (!) 153/93 129/72 116/77   Pulse: (!) 105  (!) 110 99   Resp:    14   Temp:    98.2 F (36.8 C)   TempSrc:       SpO2:   94% 94%   Weight:       Height:         No intake or output data in the 24 hours ending 03/03/17 0943  Weight Monitoring 02/20/2017 02/24/2017 02/24/2017   Height - 154.9 cm 154.9 cm   Height Method - Stated -   Weight 50.576 kg 50.6 kg 50.6 kg   Weight Method Standing Scale - -   BMI (calculated) - 21.1 kg/m2 21.1 kg/m2       Body mass index is 21.09 kg/m.        MEDS: (SCHEDULED/INFUSIONS/PRN)  Current Facility-Administered Medications   Medication Dose Route Frequency   . aspirin  81 mg Oral Daily   . atorvastatin  80 mg Oral QHS   . dilTIAZem  60 mg Oral 4 times per day   . fluticasone  1 spray Each Nare Daily   . latanoprost  1 drop Both Eyes QHS   . levothyroxine  112 mcg Oral QAM   . predniSONE  20 mg Oral QAM W/BREAKFAST   . sodium chloride (PF)  3 mL Intravenous Q8H     Current Facility-Administered Medications   Medication Dose Route Frequency Last Rate     Current Facility-Administered Medications   Medication Dose Route   . acetaminophen   650 mg Oral    Or   . acetaminophen  650 mg per NG tube    Or   . acetaminophen  650 mg Rectal   . hydrALAZINE  10 mg Intravenous   . loperamide  1 mg Oral   . LORazepam  0.5 mg Oral   . metoprolol tartrate  5 mg Intravenous   . naloxone  0.4 mg Intravenous            LABS:    Recent CBC:    Recent Labs  Lab 03/02/17  0344 02/28/17  0528   WBC 8.7 8.7   RBC 3.88 3.68*   Hemoglobin 13.7 12.6   Hematocrit 40.6 39.1   MCV 105* 106*   PLT CT 250 191       Recent BMP:    Recent Labs  Lab 03/02/17  0344 03/01/17  0556   Sodium 133* 132*   Potassium 3.4* 3.5   Chloride  107 107   CO2 17.6* 16.4*   BUN 19 27*   Creatinine 0.62 0.71   Glucose 87 88   EGFR 82 78   Calcium 8.4* 8.8       Other Labs (if any):  LFTs     -     Recent Labs  Lab 03/01/17  0556   Magnesium 1.7        Recent Labs  Lab 02/24/17  1019   ALT 16   AST (SGOT) 38   Bilirubin, Total 1.0   Albumin 4.1   Alkaline Phosphatase 81     Cardiac -     PT/PTT -            IMAGING STUDIES:  Ct Angiogram Head    Result Date: 02/25/2017  Normal CTA head. ReadingStation:[id number]    Xr Knee 1 Or 2 Views Right    Result Date: 02/26/2017  There is a large joint effusion and linear sclerosis along the lateral aspect of the proximal tibial metaphysis which is questionable for a minimally impacted fracture. Further evaluation with cross-sectional imaging should be considered when clinically feasible. MRI would be the modality of choice as it would allow for further characterization of the calcifications along the lateral joint line which could be secondary to atherosclerosis or a meniscal tear with a small amount of extruded meniscal tissue. ReadingStation:[email or web address]    Ct Head Wo Contrast    Result Date: 02/24/2017  Subarachnoid hemorrhage in left frontal lobe, with surrounding vasogenic edema, loss of gray-white matter differentiation, mild effacement of left lateral ventricle, and 3 mm left-to-right midline shift. This mostly involves the region of Broca's area, which  may explain the patient's difficulty with speech articulation. An underlying mass cannot be excluded. A stat MRI of the brain with contrast is recommended for further characterization. Findings were discussed with Dr. Nunzio Cory in the emergency room at 12:50pm on the 10th of December, 2018. ReadingStation:[email or web address]    Ct Angiogram Neck    Result Date: 02/25/2017  Abnormal CT angiogram of the neck demonstrating: 1. On the right the internal carotid is moderate stenosis of approximately 60%. The right vertebral artery is patent. 2. On the left there is mild narrowing the proximal internal carotid artery. The left vertebral artery is patent. ReadingStation:[id number]    Ct Knee Right Wo Contrast    Result Date: 02/26/2017  1.  Osteoporosis, marked cartilage loss in the lateral compartment, and chronic-appearing trabecular impaction lateral tibial plateau without lucent fracture line or depression. 2.  Large effusion and popliteal cyst. Chondrocalcinosis may be related to underlying hypercalcemia or CPPD.  ReadingStation:[email or web address]    Mri Brain W Wo Contrast    Result Date: 02/24/2017  Abnormal MRI scan of the head demonstrating: 1. There is an acute cortical infarction of the left operculofrontal region with hemorrhagic features without significant mass effect 2. There is a chronic microhemorrhage in the left thalamus 3. Moderate cortical and subcortical atrophy. 4. Mild chronic microvascular ischemic disease. ReadingStation:[id number]    Xr Chest Ap Portable    Result Date: 03/03/2017  IMPRESSION: Recurrent moderate right pleural effusion with middle and lower lobe atelectasis. Correlate clinically to exclude pneumonia. ReadingStation:[email or web address]         Physical Exam:  Vitals:  T:98.2 F (36.8 C)   BP:116/77, HR:99, RR:14, SaO2:94%  General: Awake, Alert. No Acute Distress.  HEENT:  NCAT, MMM, EOMI  Cardiovascular: RRR. No murmurs. Brisk cap refill  Respiratory: CTAB. No rales, rhonchi or retractions.  Abdomen/GI:  Nontender,  nondistended. Normoactive BS  Integument: Skin intact, no bruises.  Extremities: Right knee effusion resolving, significant left ankle joint edema with limited range of motion due to pain   Neurologic: Expressive aphasia, mild right-sided weakness.  Psychiatric: AA, cooperative.       ASSESSMENT AND PLAN:    # Acute ischemic stroke with hemorrhagic conversion.   On low-dose aspirin per neurology.   Hold eliquis until repeat head CT is done in 3 weeks.   Continue Lipitor. PT/OT/ST. Diet per ST.  Echo:    Conclusions        1: The left ventricular ejection fraction is mildly decreased, estimated at 40-45%.                       2: Unable to assess diastolic function due to atrial fibrillation.                       3: The right ventricular size is moderately increased.                       4: The right ventricular systolic function is mildly decreased.                       5: The left atrium is moderately enlarged.                       6: The right atrium is severely enlarged.                       7: RVSP normal at 25-30 mmHg.       # chronic systolic CHF  bblocker stopped due to bradycardia   Not currently on lasix as not acute CHF  No ACE due to allergy    # Colitis: Unclear etiology. negative c diff.  Off abx.    # Hypertension: BP soft, Continue to hold outpatient medictions    # Hypothyroidism: On Synthroid    # Atrial fibrillation: Continue to hold eliquis. Titrate cardizem.  Metoprolol held.   Cont tele.     # Right knee effusion with a possible old tibial fracture:  orthopedics following. Supportive care    # prolonged QTc 516, resolved.    # hypokalemia: replace.    # Left ankle joint effusion: Unclear etiology. Obtain x-ray. Reconsult orthopedics. Restart methotrexate in case this is related to her underlining rheumatoid arthritis.    Disposition: Hold discharge.      Signed by:  Marlou Sa, MD  Please contact at phone number [phone] for any questions.

## 2017-03-03 NOTE — Plan of Care (Signed)
Problem: Every Day - Stroke  Goal: Effective coping demonstrated  Outcome: Progressing   03/03/17 2002   Goal/Interventions addressed this shift   Effective coping demonstrated  Assess/report to LIP uncontrolled anxiety, depression, or ineffective coping;Offer reassurance to decrease anxiety

## 2017-03-03 NOTE — Progress Notes (Addendum)
Ask to see patient again about left ankle pain and swelling.  X-rays reveal no fracture, but there is a small effusion.  If concern for infection or gout, would recommend aspiration by IR.  Patient discussed with Dr. Manson Passey. Further recommendations to follow.    Kelton Pillar. Raelyn Number, PA-C  Karle Plumber, MD   [facility]  Pager [phone]

## 2017-03-03 NOTE — UM Notes (Signed)
CONTINUED STAY REVIEW 03/03/2017  MEDICARE      Kristina [name]  1929-10-05  MRN: [id number]      Neurology note:  12/17: Neurology signed off on 12/13. Received call from RN; concerned about patient's confusion, was told in report that patient was alert and oriented. I went to see patient, and she was aphasic, similar to how she presented in ED, but mildly improved ability to follow commands. Patient getting up to side of bed to walk with CNA. She has difficulty with word finding.   ASSESSMENT AND PLAN:   Kristina [name], 83 year old female initially brought to hospital with diarrhea and dehydration, had imaging for expressive aphasia, found to have cerebral infarction with hemorrhagic conversion.    Was called to evaluate patient for concerns of new confusion. Upon assessment, patient is unchanged from previous assessments. Able to follow some commands, still with expressive aphasia.  No further neurologic workup indicated.     1. Diagnostics:  1. MRI brain reports left frontal cerebral infarction with hemorrhagic conversion.   2. ECHO completed 02/21/17. No need to repeat.   3. CTA head/neck with R ICA ~60%.   4. Neuro checks and vital signs q4h  5. Telemetry.  6. Repeat CT head 3 weeks(has been ordered)  2. Labs:   1. Cholesterol 137, HDL 33, LDL 86, TG 90, B12 493, folate 16.8, HA1c 5.7  3. Medications:   1. Hold Eliquis until repeat CT head in 3 weeks. Continue low dose ASA.  2. Atorvastatin 80 mg.   4. Rehabilitation services:   1. PT/OT  2. SLP    She will need to follow up with neurology in 4 weeks following CT head to determine if safe to restart Eliquis.   Neurology signing off, please call with further questions.       CASE MANAGEMENT:   03/03/17 1535    CM Review   CM Comments DCP 03/03/17: Adm diarrhea/rectal bleeding-recent adm for colitis-IV abx; Dysarthria; MRI +infarct w/hemorrhagic conversion: Desires STR  PT/OT rec STR  referral sent to [facility], [facility], and [facility]  re reviewing patient TVO has  declined, patient is not medically ready for D/C             VITALS: BP 131/72   Pulse 95   Temp 97.7 F (36.5 C) (Oral)   Resp 19   Ht 1.549 m (5' 0.98")   Wt 50.6 kg (111 lb 8.8 oz)   SpO2 95%   BMI 21.09 kg/m            Adele Dan, RN BSN  Utilization Review Nurse  Utilization Management  [facility]  [address]  [place], [place] [address]  Phone: [phone], direct/confidential  Fax: [phone]  [email or web address]

## 2017-03-03 NOTE — Plan of Care (Signed)
Problem: Every Day - Stroke  Goal: Neurological status is stable or improving  Outcome: Progressing

## 2017-03-03 NOTE — OT Plan of Care Note (Signed)
[facility]  [facility]  [address]  [place] [address]  Department of Rehabilitation Services  [phone]    Kristina [name] CSN: [id number]  NEURO SURGICAL [id number]    Occupational Therapy General Note  Time: [phone]    Attempted to see pt for OT treatment, however RN stated patient with L ankle pain and unable to bear weight and perform mobility.  RN stated pt to get X-ray and hold therapy until given results.      Team communication: Tobi Bastos, RN    Plan: Will follow up as schedule permits per POC.     Geraldine Tesar J Cassadie Pankonin, COTA/L

## 2017-03-03 NOTE — Progress Notes (Signed)
03/03/17 1535   CM Review   CM Comments DCP 03/03/17: Adm diarrhea/rectal bleeding-recent adm for colitis-IV abx; Dysarthria; MRI +infarct w/hemorrhagic conversion: Desires STR  PT/OT rec STR  referral sent to [facility], [facility], and [facility]  re reviewing patient TVO has declined, patient is not medically ready for D/C                 Judson Roch LPN /DCP   [id number]

## 2017-03-04 DIAGNOSIS — I509 Heart failure, unspecified: Secondary | ICD-10-CM | POA: Diagnosis not present

## 2017-03-04 DIAGNOSIS — Z85820 Personal history of malignant melanoma of skin: Secondary | ICD-10-CM | POA: Diagnosis not present

## 2017-03-04 DIAGNOSIS — R1312 Dysphagia, oropharyngeal phase: Secondary | ICD-10-CM | POA: Diagnosis present

## 2017-03-04 DIAGNOSIS — I69020 Aphasia following nontraumatic subarachnoid hemorrhage: Secondary | ICD-10-CM | POA: Diagnosis not present

## 2017-03-04 DIAGNOSIS — I4891 Unspecified atrial fibrillation: Secondary | ICD-10-CM | POA: Diagnosis not present

## 2017-03-04 DIAGNOSIS — J918 Pleural effusion in other conditions classified elsewhere: Secondary | ICD-10-CM | POA: Diagnosis present

## 2017-03-04 DIAGNOSIS — R197 Diarrhea, unspecified: Secondary | ICD-10-CM | POA: Diagnosis not present

## 2017-03-04 DIAGNOSIS — J168 Pneumonia due to other specified infectious organisms: Secondary | ICD-10-CM | POA: Diagnosis not present

## 2017-03-04 DIAGNOSIS — I6200 Nontraumatic subdural hemorrhage, unspecified: Secondary | ICD-10-CM | POA: Diagnosis not present

## 2017-03-04 DIAGNOSIS — I480 Paroxysmal atrial fibrillation: Secondary | ICD-10-CM | POA: Diagnosis not present

## 2017-03-04 DIAGNOSIS — Z7982 Long term (current) use of aspirin: Secondary | ICD-10-CM | POA: Diagnosis not present

## 2017-03-04 DIAGNOSIS — E785 Hyperlipidemia, unspecified: Secondary | ICD-10-CM | POA: Diagnosis not present

## 2017-03-04 DIAGNOSIS — I10 Essential (primary) hypertension: Secondary | ICD-10-CM | POA: Diagnosis not present

## 2017-03-04 DIAGNOSIS — J189 Pneumonia, unspecified organism: Secondary | ICD-10-CM | POA: Diagnosis not present

## 2017-03-04 DIAGNOSIS — M6281 Muscle weakness (generalized): Secondary | ICD-10-CM | POA: Diagnosis not present

## 2017-03-04 DIAGNOSIS — R2689 Other abnormalities of gait and mobility: Secondary | ICD-10-CM | POA: Diagnosis not present

## 2017-03-04 DIAGNOSIS — R531 Weakness: Secondary | ICD-10-CM | POA: Diagnosis not present

## 2017-03-04 DIAGNOSIS — Z888 Allergy status to other drugs, medicaments and biological substances status: Secondary | ICD-10-CM | POA: Diagnosis not present

## 2017-03-04 DIAGNOSIS — E039 Hypothyroidism, unspecified: Secondary | ICD-10-CM | POA: Diagnosis present

## 2017-03-04 DIAGNOSIS — R072 Precordial pain: Secondary | ICD-10-CM | POA: Diagnosis not present

## 2017-03-04 DIAGNOSIS — I5023 Acute on chronic systolic (congestive) heart failure: Secondary | ICD-10-CM | POA: Diagnosis present

## 2017-03-04 DIAGNOSIS — I110 Hypertensive heart disease with heart failure: Secondary | ICD-10-CM | POA: Diagnosis present

## 2017-03-04 DIAGNOSIS — I69391 Dysphagia following cerebral infarction: Secondary | ICD-10-CM | POA: Diagnosis not present

## 2017-03-04 DIAGNOSIS — J90 Pleural effusion, not elsewhere classified: Secondary | ICD-10-CM | POA: Diagnosis not present

## 2017-03-04 DIAGNOSIS — F329 Major depressive disorder, single episode, unspecified: Secondary | ICD-10-CM | POA: Diagnosis present

## 2017-03-04 DIAGNOSIS — E559 Vitamin D deficiency, unspecified: Secondary | ICD-10-CM | POA: Diagnosis present

## 2017-03-04 DIAGNOSIS — I69211 Memory deficit following other nontraumatic intracranial hemorrhage: Secondary | ICD-10-CM | POA: Diagnosis not present

## 2017-03-04 DIAGNOSIS — R079 Chest pain, unspecified: Secondary | ICD-10-CM | POA: Diagnosis not present

## 2017-03-04 DIAGNOSIS — M25531 Pain in right wrist: Secondary | ICD-10-CM | POA: Diagnosis not present

## 2017-03-04 DIAGNOSIS — I5082 Biventricular heart failure: Secondary | ICD-10-CM | POA: Diagnosis present

## 2017-03-04 DIAGNOSIS — Z66 Do not resuscitate: Secondary | ICD-10-CM | POA: Diagnosis present

## 2017-03-04 DIAGNOSIS — I071 Rheumatic tricuspid insufficiency: Secondary | ICD-10-CM | POA: Diagnosis present

## 2017-03-04 DIAGNOSIS — I69320 Aphasia following cerebral infarction: Secondary | ICD-10-CM | POA: Diagnosis not present

## 2017-03-04 DIAGNOSIS — M069 Rheumatoid arthritis, unspecified: Secondary | ICD-10-CM | POA: Diagnosis not present

## 2017-03-04 DIAGNOSIS — I639 Cerebral infarction, unspecified: Secondary | ICD-10-CM | POA: Diagnosis not present

## 2017-03-04 DIAGNOSIS — R609 Edema, unspecified: Secondary | ICD-10-CM | POA: Diagnosis not present

## 2017-03-04 DIAGNOSIS — I482 Chronic atrial fibrillation: Secondary | ICD-10-CM | POA: Diagnosis present

## 2017-03-04 LAB — ECG 12-LEAD
Patient Age: 87 years
Q-T Interval(Corrected): 517 ms
Q-T Interval: 434 ms
QRS Axis: -11 deg
QRS Duration: 86 ms
T Axis: 32 years
Ventricular Rate: 85 //min

## 2017-03-04 MED ORDER — PREDNISONE 20 MG PO TABS
20.00 mg | ORAL_TABLET | Freq: Every morning | ORAL | Status: DC
Start: 2017-03-05 — End: 2017-03-25

## 2017-03-04 MED ORDER — ASPIRIN 81 MG PO CHEW
81.00 mg | CHEWABLE_TABLET | Freq: Every day | ORAL | Status: DC
Start: 2017-03-05 — End: 2017-08-16

## 2017-03-04 MED ORDER — ATORVASTATIN CALCIUM 80 MG PO TABS
80.00 mg | ORAL_TABLET | Freq: Every evening | ORAL | Status: DC
Start: 2017-03-04 — End: 2017-06-05

## 2017-03-04 NOTE — Plan of Care (Addendum)
Problem: Every Day - Stroke  Goal: Neurological status is stable or improving  Outcome: Progressing  Neurological status is stable or improving according to NIH scale.     03/04/17 1610   Goal/Interventions addressed this shift   Neurological status is stable or improving Monitor/assess/document neurological assessment (Stroke: every 4 hours);Monitor/assess NIH Stroke Scale;Re-assess NIH Stroke Scale for any change in status;Perform CAM Assessment       Comments: Patient alert and interactive.  Expressive aphasia improved since last shift.  Patient states no pain this AM.  Call report to Amy at [facility]   Patient transported at 1330.

## 2017-03-04 NOTE — Discharge Summary (Signed)
[facility]      Patient: Kristina [name]  Admission Date: 02/24/2017   DOB: 09-29-1929  Discharge Date: 03/04/2017    MRN: [id number]  Discharge Attending: Durward Mallard, MD   Referring Physician: Patsy Lager, MD  PCP: Patsy Lager, MD       DISCHARGE SUMMARY     Discharge Information   Discharge Diagnoses:    Acute ischemic CVA with hemorrhagic compression  Aphasia/dysarthria  Diarrhea  History of atrial fibrillation  Rheumatoid arthritis  Right knee and left ankle joint effusion of unclear etiology  Possible right old tibial fracture  Hyperlipidemia  Hypothyroidism  Bradycardia    Discharge Medications:     Medication List      START taking these medications    aspirin 81 MG chewable tablet  Chew 1 tablet (81 mg total) by mouth daily.  Start taking on:  03/05/2017     atorvastatin 80 MG tablet  Commonly known as:  LIPITOR  Take 1 tablet (80 mg total) by mouth nightly.     predniSONE 20 MG tablet  Commonly known as:  DELTASONE  Take 1 tablet (20 mg total) by mouth every morning with breakfast.For 5 days  Start taking on:  03/05/2017        CONTINUE taking these medications    dilTIAZem 240 MG 24 hr capsule  Commonly known as:  CARDIZEM CD     fluticasone 50 MCG/ACT nasal spray  Commonly known as:  FLONASE     folic acid 1 MG tablet  Commonly known as:  FOLVITE     lactase 3000 units tablet  Commonly known as:  LACTAID     lactobacillus species capsule  Commonly known as:  BIO-K PLUS  Take 1 capsule (50 Billion CFU total) by mouth daily.     latanoprost 0.005 % ophthalmic solution  Commonly known as:  XALATAN     levothyroxine 112 MCG tablet  Commonly known as:  SYNTHROID, LEVOTHROID     LUMIGAN 0.01 % Soln  Generic drug:  bimatoprost     methotrexate 2.5 MG tablet     nitroglycerin 0.4 MG SL tablet  Commonly known as:  NITROSTAT     PROAIR HFA 108 (90 Base) MCG/ACT inhaler  Generic drug:  albuterol     SIMBRINZA 1-0.2 % Susp  Generic drug:  Brinzolamide-Brimonidine        STOP taking these medications     amoxicillin-clavulanate 875-125 MG per tablet  Commonly known as:  AUGMENTIN     furosemide 20 MG tablet  Commonly known as:  LASIX     metoprolol tartrate 25 MG tablet  Commonly known as:  LOPRESSOR     potassium chloride 10 MEQ tablet  Commonly known as:  K-TAB, KLOR-CON           Where to Get Your Medications      Information about where to get these medications is not yet available    Ask your nurse or doctor about these medications   aspirin 81 MG chewable tablet   atorvastatin 80 MG tablet   predniSONE 20 MG tablet             Hospital Course   History of Present Illness and Hospital Course (8 Days)     Kristina [name] is a 83 y.o. female with history of atrial fibrillation who used to be on eliquis which was discontinued when she was discharged from [facility] recently after she was hospitalized for bloody  diarrhea. Within a few days, the patient was noted to have developed slurred speech. In the ER at the initial head CT showed concern for intracranial bleed. Subsequent MRI showed ischemic CVA with hemorrhagic conversion. She was subsequently admitted to [facility] and treated with aspirin for her stroke per neurology's recommendation. Eliquis has been discontinued.   She was also continued on empirical abx for her colitis.  Her diarrhea has resolved and the abx has been discontinued prior to discharge.    She also developed transient bradycardia.  Lopressor has been discontinued.      Lastly she did develop right knee effusion and left ankle effusion.  Xrays showed possible right tibial, old fracture.  A 5-day course of prednisone is being tried for possible gout/pseudogout.         Procedures/Imaging   XR Ankle Left AP And Lateral   Final Result   Nonspecific soft tissue swelling, and degenerative changes. Vascular calcification noted. Chronic degenerative changes. Lucency of navicular bone nonspecific but could represent chronic osteomyelitis. If there is clinical  concern, MRI would be modality    of choice.      ReadingStation:[email or web address]      XR Chest AP Portable   Final Result   IMPRESSION: Recurrent moderate right pleural effusion with middle and lower lobe atelectasis. Correlate clinically to exclude pneumonia.      ReadingStation:[email or web address]      Echo Doppler Waveform Complete   Final Result      CT Knee Right WO Contrast   Final Result   1.  Osteoporosis, marked cartilage loss in the lateral compartment, and chronic-appearing trabecular impaction lateral tibial plateau without lucent fracture line or depression.   2.  Large effusion and popliteal cyst. Chondrocalcinosis may be related to underlying hypercalcemia or CPPD.             ReadingStation:[email or web address]      XR Knee 1 Or 2 Views Right   Final Result   There is a large joint effusion and linear sclerosis along the lateral aspect of the proximal tibial metaphysis which is questionable for a minimally impacted fracture. Further evaluation with cross-sectional imaging should be considered when clinically    feasible. MRI would be the modality of choice as it would allow for further characterization of the calcifications along the lateral joint line which could be secondary to atherosclerosis or a meniscal tear with a small amount of extruded meniscal    tissue.      ReadingStation:[email or web address]      CT Angiogram Head   Final Result   Normal CTA head.      ReadingStation:[id number]      CT Angiogram Neck   Final Result   Abnormal CT angiogram of the neck demonstrating:   1. On the right the internal carotid is moderate stenosis of approximately 60%. The right vertebral artery is patent.   2. On the left there is mild narrowing the proximal internal carotid artery. The left vertebral artery is patent.      ReadingStation:[id number]      MRI Brain W WO Contrast   Final Result   Abnormal MRI scan of the head demonstrating:   1. There is an acute cortical infarction of the left operculofrontal region with hemorrhagic features  without significant mass effect   2. There is a chronic microhemorrhage in the left thalamus   3. Moderate cortical and subcortical atrophy.   4. Mild chronic microvascular ischemic disease.      ReadingStation:[id number]  CT Head WO Contrast   Final Result   Subarachnoid hemorrhage in left frontal lobe, with surrounding vasogenic edema, loss of gray-white matter differentiation, mild effacement of left lateral ventricle, and 3 mm left-to-right midline shift. This mostly involves the region of Broca's area,    which may explain the patient's difficulty with speech articulation. An underlying mass cannot be excluded. A stat MRI of the brain with contrast is recommended for further characterization.      Findings were discussed with Dr. Nunzio Cory in the emergency room at 12:50pm on the 10th of December, 2018.      ReadingStation:[email or web address]      CT Head WO Contrast    (Results Pending)       Treatment Team:   Attending Provider: Marlou Sa, MD  Consulting Physician: Randolm Idol, MD         Progress Note/Physical Exam at Discharge     Subjective: left ankle is less painful; no diarrhea    Vitals:    03/04/17 0425 03/04/17 0601 03/04/17 0735 03/04/17 1114   BP: 123/87  125/86 (!) 144/99   Pulse: (!) 113 100 86    Resp: 14  15    Temp:   97.5 F (36.4 C)    TempSrc:   Oral    SpO2: 94%  91%    Weight:       Height:           General Appearance: No apparent distress.   Eyes: no scleral icterus or conjunctival pallor  Mouth: MMM, no thrush  Cardiovascular: RRR, no murmurs  Respiratory: No increased work of breathing. CTA.  Gastrointestinal: Soft, +BS, non-tender  Skin: no rashes, jaundice or other lesions  Neurologic: no gross motor or sensory deficits  Psychiatric: alert and oriented, normal affect  MS: Improved range of motion of left ankle joint. Stable effusion.       Diagnostics     Labs/Studies Pending at Discharge: No    Last Labs     Recent Labs  Lab 03/02/17  0344 02/28/17  0528   WBC 8.7 8.7   RBC  3.88 3.68*   Hemoglobin 13.7 12.6   Hematocrit 40.6 39.1   MCV 105* 106*   PLT CT 250 191         Recent Labs  Lab 03/02/17  0344 03/01/17  0556 02/28/17  0528   Sodium 133* 132* 131*   Potassium 3.4* 3.5 3.3*   Chloride 107 107 105   CO2 17.6* 16.4* 17.9*   BUN 19 27* 21   Creatinine 0.62 0.71 0.66   Glucose 87 88 95   Calcium 8.4* 8.8 8.7   Magnesium  --  1.7  --         Patient Instructions   Discharge Diet: cardiac diet  Discharge Activity:  activity as tolerated    Follow Up Appointment:  Follow-up Information     Pcp, Notonfile, MD Follow up.                  Time spent examining patient, discussing with patient/family regarding hospital course, chart review, reconciling medications and discharge planning: 40 minutes.    Signed,  Durward Mallard, MD - I can be reached at [phone] or at Pager [phone])  03/04/2017 11:27 AM

## 2017-03-04 NOTE — Progress Notes (Signed)
03/04/17 1148   Discharge Disposition   Patient preference/choice provided? Yes   Physical Discharge Disposition SNF   Receiving facility, unit and room [facility]   Nursing report phone number: [phone]   Mode of Transportation Wheelchair Tamala Bari up time 13:30   Patient/Family/POA notified of transfer plan Yes   Patient agreeable to discharge plan/expected d/c date? Yes   Family/POA agreeable to discharge plan/expected d/c date? Yes   Bedside nurse notified of transport plan? Yes   Advanced directive/code status rescreen completed before discharge?  Yes   CM Interventions   Follow up appointment scheduled? Yes   Multidisciplinary rounds/family meeting before d/c? Yes   Medicare Checklist   Is this a Medicare patient? Yes   3 midnight inpatient qualifying stay (SNF only) Yes   Medicaid/DMAS   Medicaid Pre-Screening completed Yes   DMAS 95 MI/MR completed and faxed Yes   DMAS 95 MI/MR trigger Level 2 Screening? Yes         MD aware of D/C date time and destination and need for AVS, D/C summary and scripts       Nurse aware of D/C date time and destination and number for report         Family aware of D/C date time and destination and has agreed to W/C transport and cost that may occur                   Judson Roch LPN /DCP   [id number]

## 2017-03-04 NOTE — PT Progress Note (Signed)
PT Progress Note    [facility]: [facility]  Department of Rehabilitation Services: [phone]  Kristina [name]    CSN: [id number]    NEURO SURGICAL   [id number]/439-A    Physical Therapy Treatment Note    Time of treatment:   Time Calculation  PT Received On: 03/04/17  Start Time: 1109  Stop Time: 1135  Time Calculation (min): 26 min    Visit#: 3    Last seen by Physical therapist vs. PTA: 03/04/17    Medical Diagnosis/Pertinent medical/surgical details: admitted 12/10/2018with dysarthria and persistent diarrhea with imaging revealing ischemic infarct with hemorrhagic conversion. Patient now with Broca's aphasia.    Precautions and Contraindications:  Falls  Mobility protocol   WBAT RLE per Dr. Manson Passey (attested note 02/27/17, confirmed via phone call 12/14)    Assessment:   Patient's progress towards established goals: Patient with left foot pain.  X-ray reveals no fracture.  Patient with tenderness at the navicular and in the arch of the foot.  With inspection, patient has high arches and would benefit from shoes when ambulation so shoes were donned during session.  Completed seated therex and ambulated x120 feet with min assist for balance.  Patient reporting improvement in foot and ankle pain with shoe.      Patient continues to have the following impairments: decreased strength, decreased activity tolerance, decreased functional mobility, decreased balance, gait deficits, pain    Patient will continue to benefit from skilled PT services in order to maximize functional independence.        Goals:   LTG (by discharge)   1. Patient will perform bed mobility with independenceassistance in prep for out of bed activity. Ongoing  2. Patient will perform sit to stand transfers with front wheeled walkerand mod Iassistance in prep for ambulation. Ongoing  3. Patient will ambulate 136feet with front wheeled walkerand mod Iassistance for household/community ambulation. Ongoing    *Further goals to be  determined based on patient's progress in therapy     Plan:   Treatment/interventions: Exercise, Gait training, Neuromuscular re-education, Functional transfer training, LE strengthening/ROM, Patient/caregiver training, Equipment eval/education, Bed mobility    Treatment Frequency: 4-5x/wk    DISCHARGE RECOMMENDATIONS   DME recommended for Discharge:   TBD at next level of care    Discharge Recommendations:   Acute Rehab         Subjective:   "My foot is ok.... Better"   Patient is agreeable to participation in the therapy session. Nursing clears patient for therapy.    Pain:  At Rest: 0 /10  With Activity: 3/10 per FACES  Location: left foot  Interventions: Medication (see eMAR), RN/LPN aware    OBJECTIVE:   Observation of Patient/Vital Signs:   Patient is in bed with telemetry  Patient's medical condition is appropriate for Physical therapy intervention at this time.    Vital Signs:  See flowsheet between start time and stop times for vitals taken during session            Oriented to: Oriented x4   Trouble with word finding    Musculoskeletal and Balance Details:                Bed Mobility:   Not tested due to patient already OOB.    Transfers:  Sit to Stand:  Moderate assist with Front wheeled walker.    Cues for Sequencing, Cues for Hand Placement  Stand to Sit:  Minimal assist.    Cues for Sequencing,  Cues for Hand Placement, assist for descent    Locomotion:  LEVEL AMBULATION:  Distance: x120 feet   Assistance level:  Minimal assist  Device:  Front wheeled walker  Pattern:  decreased cadence, slight bilateral in-toeing, anterior trunk lean.  Cues to correct deficits with good carryover    Participation and Activity Tolerance   Participation effort: Good  Activity Tolerance: Tolerates 10-20 minutes of activity with multiple rests    Other Treatment Interventions this session:   Therapeutic exercise:  Sitting:  Long Arc Quads:  10   Marching:  10   Heel/Toe raises:  10   biceps curls   Therapeutic activity    Gait training  Education Provided:   TOPICS: role of physical therapy, plan of care, goals of therapy and HEP, safety with mobility and ADLs, benefits of activity, home safety, use of adaptive equipment, bed mobility with use of adaptive equipment or strategy, activity with nursing    Learner educated: Patient  Method: Explanation  Response to education: Verbalized understanding    Patient Position at End of Treatment:   Sitting, in a chair, Needs in reach, Bed/chair alarm set and No distress    Team Communication:   Spoke to : CNA  Regarding: Patient participation with Therapy, Vital signs  Whiteboard updated: No  PT/PTA communication: via written note and verbal communication as needed.      Recommend patient ambulate 3x daily in halls with assist and sit in chair for meals outside of PT sessions.    Sydell Axon, PT, DPT

## 2017-03-06 DIAGNOSIS — I69320 Aphasia following cerebral infarction: Secondary | ICD-10-CM | POA: Diagnosis not present

## 2017-03-10 ENCOUNTER — Other Ambulatory Visit
Admission: RE | Admit: 2017-03-10 | Discharge: 2017-03-10 | Disposition: A | Discharge status: Home / Self Care | Payer: Self-pay | Source: Ambulatory Visit | Attending: Family Medicine | Admitting: Family Medicine

## 2017-03-10 LAB — COMPREHENSIVE METABOLIC PANEL
ALT: 35 U/L (ref 0–55)
AST (SGOT): 31 U/L (ref 10–42)
Albumin/Globulin Ratio: 1.15 Ratio (ref 0.70–1.50)
Albumin: 3 gm/dL — ABNORMAL LOW (ref 3.5–5.0)
Alkaline Phosphatase: 95 U/L (ref 40–145)
Anion Gap: 14.6 mMol/L (ref 7.0–18.0)
BUN / Creatinine Ratio: 20.5 Ratio (ref 10.0–30.0)
BUN: 15 mg/dL (ref 7–22)
Bilirubin, Total: 0.6 mg/dL (ref 0.1–1.2)
CO2: 24.2 mMol/L (ref 20.0–30.0)
Calcium: 9.2 mg/dL (ref 8.5–10.5)
Chloride: 102 mMol/L (ref 98–110)
Creatinine: 0.73 mg/dL (ref 0.60–1.20)
EGFR: 75 mL/min/{1.73_m2} (ref 60–150)
Globulin: 2.6 gm/dL (ref 2.0–4.0)
Glucose: 85 mg/dL (ref 71–99)
Osmolality Calc: 274 mOsm/kg — ABNORMAL LOW (ref 275–300)
Potassium: 3.8 mMol/L (ref 3.5–5.3)
Protein, Total: 5.6 gm/dL — ABNORMAL LOW (ref 6.0–8.3)
Sodium: 137 mMol/L (ref 136–147)

## 2017-03-10 LAB — CBC AND DIFFERENTIAL
Basophils %: 0.6 % (ref 0.0–3.0)
Basophils Absolute: 0.1 10*3/uL (ref 0.0–0.3)
Eosinophils %: 0.4 % (ref 0.0–7.0)
Eosinophils Absolute: 0.1 10*3/uL (ref 0.0–0.8)
Hematocrit: 45.3 % (ref 36.0–48.0)
Hemoglobin: 14.4 gm/dL (ref 12.0–16.0)
Lymphocytes Absolute: 2.6 10*3/uL (ref 0.6–5.1)
Lymphocytes: 19.1 % (ref 15.0–46.0)
MCH: 33 pg (ref 28–35)
MCHC: 32 gm/dL (ref 32–36)
MCV: 104 fL — ABNORMAL HIGH (ref 80–100)
MPV: 7.4 fL (ref 6.0–10.0)
Monocytes Absolute: 1.1 10*3/uL (ref 0.1–1.7)
Monocytes: 7.9 % (ref 3.0–15.0)
Neutrophils %: 72 % (ref 42.0–78.0)
Neutrophils Absolute: 9.6 10*3/uL — ABNORMAL HIGH (ref 1.7–8.6)
PLT CT: 393 10*3/uL (ref 130–440)
RBC: 4.37 10*6/uL (ref 3.80–5.00)
RDW: 13.6 % (ref 11.0–14.0)
WBC: 13.4 10*3/uL — ABNORMAL HIGH (ref 4.0–11.0)

## 2017-03-10 LAB — LIPID PANEL
Cholesterol: 129 mg/dL (ref 75–199)
Coronary Heart Disease Risk: 3.07
HDL: 42 mg/dL — ABNORMAL LOW (ref 45–65)
LDL Calculated: 74 mg/dL
Triglycerides: 66 mg/dL (ref 10–150)
VLDL: 13 (ref 0–40)

## 2017-03-10 LAB — VITAMIN B12 AND FOLATE
Folate: 10.8 ng/mL (ref 7.0–19.9)
Vitamin B-12: 485 pg/mL (ref 213–816)

## 2017-03-10 LAB — TSH: TSH: 3.26 u[IU]/mL (ref 0.40–4.20)

## 2017-03-11 LAB — VITAMIN D,25 OH,TOTAL: Vitamin D 25-Hydroxy: 10 ng/mL — ABNORMAL LOW (ref 30–80)

## 2017-03-13 DIAGNOSIS — M25531 Pain in right wrist: Secondary | ICD-10-CM | POA: Diagnosis not present

## 2017-03-13 DIAGNOSIS — R197 Diarrhea, unspecified: Secondary | ICD-10-CM | POA: Diagnosis not present

## 2017-03-13 DIAGNOSIS — M069 Rheumatoid arthritis, unspecified: Secondary | ICD-10-CM | POA: Diagnosis not present

## 2017-03-13 DIAGNOSIS — R609 Edema, unspecified: Secondary | ICD-10-CM | POA: Diagnosis not present

## 2017-03-19 ENCOUNTER — Inpatient Hospital Stay: Payer: Medicare Other

## 2017-03-19 ENCOUNTER — Emergency Department: Payer: Medicare Other

## 2017-03-19 ENCOUNTER — Inpatient Hospital Stay
Admission: EM | Admit: 2017-03-19 | Discharge: 2017-03-25 | DRG: 292 | Disposition: A | Discharge status: Skilled Nursing Facility | Payer: Medicare Other | Source: Skilled Nursing Facility | Attending: Family Medicine | Admitting: Family Medicine

## 2017-03-19 DIAGNOSIS — R531 Weakness: Secondary | ICD-10-CM | POA: Diagnosis not present

## 2017-03-19 DIAGNOSIS — J90 Pleural effusion, not elsewhere classified: Secondary | ICD-10-CM | POA: Diagnosis not present

## 2017-03-19 DIAGNOSIS — I482 Chronic atrial fibrillation: Secondary | ICD-10-CM | POA: Diagnosis present

## 2017-03-19 DIAGNOSIS — Z7982 Long term (current) use of aspirin: Secondary | ICD-10-CM | POA: Diagnosis not present

## 2017-03-19 DIAGNOSIS — I5023 Acute on chronic systolic (congestive) heart failure: Secondary | ICD-10-CM | POA: Diagnosis present

## 2017-03-19 DIAGNOSIS — I69391 Dysphagia following cerebral infarction: Secondary | ICD-10-CM | POA: Diagnosis not present

## 2017-03-19 DIAGNOSIS — R079 Chest pain, unspecified: Secondary | ICD-10-CM | POA: Diagnosis not present

## 2017-03-19 DIAGNOSIS — Z66 Do not resuscitate: Secondary | ICD-10-CM | POA: Diagnosis present

## 2017-03-19 DIAGNOSIS — J189 Pneumonia, unspecified organism: Secondary | ICD-10-CM | POA: Diagnosis not present

## 2017-03-19 DIAGNOSIS — I6200 Nontraumatic subdural hemorrhage, unspecified: Secondary | ICD-10-CM | POA: Diagnosis not present

## 2017-03-19 DIAGNOSIS — M6281 Muscle weakness (generalized): Secondary | ICD-10-CM | POA: Diagnosis not present

## 2017-03-19 DIAGNOSIS — I5022 Chronic systolic (congestive) heart failure: Secondary | ICD-10-CM | POA: Diagnosis not present

## 2017-03-19 DIAGNOSIS — I5082 Biventricular heart failure: Secondary | ICD-10-CM | POA: Diagnosis present

## 2017-03-19 DIAGNOSIS — I10 Essential (primary) hypertension: Secondary | ICD-10-CM | POA: Diagnosis not present

## 2017-03-19 DIAGNOSIS — R072 Precordial pain: Secondary | ICD-10-CM | POA: Diagnosis not present

## 2017-03-19 DIAGNOSIS — E039 Hypothyroidism, unspecified: Secondary | ICD-10-CM | POA: Diagnosis present

## 2017-03-19 DIAGNOSIS — Z85820 Personal history of malignant melanoma of skin: Secondary | ICD-10-CM | POA: Diagnosis not present

## 2017-03-19 DIAGNOSIS — Z888 Allergy status to other drugs, medicaments and biological substances status: Secondary | ICD-10-CM | POA: Diagnosis not present

## 2017-03-19 DIAGNOSIS — R2681 Unsteadiness on feet: Secondary | ICD-10-CM | POA: Diagnosis not present

## 2017-03-19 DIAGNOSIS — I639 Cerebral infarction, unspecified: Secondary | ICD-10-CM | POA: Diagnosis not present

## 2017-03-19 DIAGNOSIS — F329 Major depressive disorder, single episode, unspecified: Secondary | ICD-10-CM | POA: Diagnosis present

## 2017-03-19 DIAGNOSIS — J918 Pleural effusion in other conditions classified elsewhere: Secondary | ICD-10-CM | POA: Diagnosis not present

## 2017-03-19 DIAGNOSIS — J168 Pneumonia due to other specified infectious organisms: Secondary | ICD-10-CM | POA: Diagnosis not present

## 2017-03-19 DIAGNOSIS — I480 Paroxysmal atrial fibrillation: Secondary | ICD-10-CM | POA: Diagnosis not present

## 2017-03-19 DIAGNOSIS — R1312 Dysphagia, oropharyngeal phase: Secondary | ICD-10-CM | POA: Diagnosis present

## 2017-03-19 DIAGNOSIS — E559 Vitamin D deficiency, unspecified: Secondary | ICD-10-CM | POA: Diagnosis present

## 2017-03-19 DIAGNOSIS — I619 Nontraumatic intracerebral hemorrhage, unspecified: Secondary | ICD-10-CM | POA: Diagnosis not present

## 2017-03-19 DIAGNOSIS — E785 Hyperlipidemia, unspecified: Secondary | ICD-10-CM | POA: Diagnosis not present

## 2017-03-19 DIAGNOSIS — I110 Hypertensive heart disease with heart failure: Secondary | ICD-10-CM | POA: Diagnosis not present

## 2017-03-19 DIAGNOSIS — M542 Cervicalgia: Secondary | ICD-10-CM | POA: Diagnosis not present

## 2017-03-19 DIAGNOSIS — I69211 Memory deficit following other nontraumatic intracranial hemorrhage: Secondary | ICD-10-CM | POA: Diagnosis not present

## 2017-03-19 DIAGNOSIS — I4891 Unspecified atrial fibrillation: Secondary | ICD-10-CM | POA: Diagnosis not present

## 2017-03-19 DIAGNOSIS — I509 Heart failure, unspecified: Secondary | ICD-10-CM | POA: Diagnosis not present

## 2017-03-19 DIAGNOSIS — I69820 Aphasia following other cerebrovascular disease: Secondary | ICD-10-CM | POA: Diagnosis not present

## 2017-03-19 DIAGNOSIS — M069 Rheumatoid arthritis, unspecified: Secondary | ICD-10-CM | POA: Diagnosis present

## 2017-03-19 DIAGNOSIS — I071 Rheumatic tricuspid insufficiency: Secondary | ICD-10-CM | POA: Diagnosis present

## 2017-03-19 LAB — CBC AND DIFFERENTIAL
Basophils %: 0.5 % (ref 0.0–3.0)
Basophils Absolute: 0.1 10*3/uL (ref 0.0–0.3)
Eosinophils %: 0.4 % (ref 0.0–7.0)
Eosinophils Absolute: 0 10*3/uL (ref 0.0–0.8)
Hematocrit: 42.4 % (ref 36.0–48.0)
Hemoglobin: 14.6 gm/dL (ref 12.0–16.0)
Lymphocytes Absolute: 2 10*3/uL (ref 0.6–5.1)
Lymphocytes: 17.3 % (ref 15.0–46.0)
MCH: 35 pg (ref 28–35)
MCHC: 34 gm/dL (ref 32–36)
MCV: 103 fL — ABNORMAL HIGH (ref 80–100)
MPV: 7.7 fL (ref 6.0–10.0)
Monocytes Absolute: 1 10*3/uL (ref 0.1–1.7)
Monocytes: 8.5 % (ref 3.0–15.0)
Neutrophils %: 73.4 % (ref 42.0–78.0)
Neutrophils Absolute: 8.7 10*3/uL — ABNORMAL HIGH (ref 1.7–8.6)
PLT CT: 262 10*3/uL (ref 130–440)
RBC: 4.13 10*6/uL (ref 3.80–5.00)
RDW: 13.7 % (ref 11.0–14.0)
WBC: 11.8 10*3/uL — ABNORMAL HIGH (ref 4.0–11.0)

## 2017-03-19 LAB — COMPREHENSIVE METABOLIC PANEL
ALT: 21 U/L (ref 0–55)
AST (SGOT): 24 U/L (ref 10–42)
Albumin/Globulin Ratio: 1.06 Ratio (ref 0.70–1.50)
Albumin: 3.4 gm/dL — ABNORMAL LOW (ref 3.5–5.0)
Alkaline Phosphatase: 119 U/L (ref 40–145)
Anion Gap: 14.3 mMol/L (ref 7.0–18.0)
BUN / Creatinine Ratio: 17.3 Ratio (ref 10.0–30.0)
BUN: 14 mg/dL (ref 7–22)
Bilirubin, Total: 0.8 mg/dL (ref 0.1–1.2)
CO2: 27.3 mMol/L (ref 20.0–30.0)
Calcium: 9.6 mg/dL (ref 8.5–10.5)
Chloride: 98 mMol/L (ref 98–110)
Creatinine: 0.81 mg/dL (ref 0.60–1.20)
EGFR: 66 mL/min/{1.73_m2} (ref 60–150)
Globulin: 3.2 gm/dL (ref 2.0–4.0)
Glucose: 128 mg/dL — ABNORMAL HIGH (ref 71–99)
Osmolality Calc: 274 mOsm/kg — ABNORMAL LOW (ref 275–300)
Potassium: 3.6 mMol/L (ref 3.5–5.3)
Protein, Total: 6.6 gm/dL (ref 6.0–8.3)
Sodium: 136 mMol/L (ref 136–147)

## 2017-03-19 LAB — VH CELL COUNT BODY FLUID
Body Fluid Lymphocytes: 95 %
Body Fluid Monocytes: 3 %
Body Fluid Neutrophil Count: 2 %
Body Fluid RBC: 1457 /mm3
Total NUCLEATED CELL COUNT: 433 /mm3

## 2017-03-19 LAB — VH LDH BODY FLUID: Body Fluid LDH: 454 U/L

## 2017-03-19 LAB — PROCALCITONIN: Procalcitonin: 0.06 ng/mL (ref 0.00–0.09)

## 2017-03-19 LAB — VH I-STAT LACTIC ACID NOTIFICATION

## 2017-03-19 LAB — VH INFLUENZA A/B RAPID TEST
Influenza A: NEGATIVE
Influenza B: NEGATIVE

## 2017-03-19 LAB — VH GLUCOSE BODY FLUID: Body Fluid Glucose: 132 mg/dL

## 2017-03-19 LAB — I-STAT LACTIC ACID
Lactic Acid I-Stat: 1.55 mMol/L (ref 0.50–2.10)
Room Number I-Stat: 5

## 2017-03-19 LAB — ECG 12-LEAD
Patient Age: 87 years
Q-T Interval(Corrected): 541 ms
Q-T Interval: 372 ms
QRS Axis: -27 deg
QRS Duration: 77 ms
T Axis: 132 years
Ventricular Rate: 127 //min

## 2017-03-19 LAB — TROPONIN I
Troponin I: 0.02 ng/mL (ref 0.00–0.02)
Troponin I: 0.02 ng/mL (ref 0.00–0.02)

## 2017-03-19 LAB — VH PROTEIN BODY FLUID: Body Fluid Protein: 2.6 gm/dL

## 2017-03-19 LAB — B-TYPE NATRIURETIC PEPTIDE: B-Natriuretic Peptide: 383.7 pg/mL — ABNORMAL HIGH (ref 0.0–100.0)

## 2017-03-19 LAB — PT/INR
PT INR: 1.2 (ref 0.5–1.3)
PT: 11.9 s — ABNORMAL HIGH (ref 9.5–11.5)

## 2017-03-19 LAB — PROTEIN, TOTAL: Protein, Total: 5.4 gm/dL — ABNORMAL LOW (ref 6.0–8.3)

## 2017-03-19 MED ORDER — FOLIC ACID 1 MG PO TABS
1.00 mg | ORAL_TABLET | Freq: Every day | ORAL | Status: DC
Start: 2017-03-19 — End: 2017-03-24
  Administered 2017-03-20: 11:00:00 1 mg via ORAL
  Filled 2017-03-19 (×6): qty 1

## 2017-03-19 MED ORDER — ATORVASTATIN CALCIUM 40 MG PO TABS
80.00 mg | ORAL_TABLET | Freq: Every evening | ORAL | Status: DC
Start: 2017-03-19 — End: 2017-03-25
  Administered 2017-03-19 – 2017-03-24 (×4): 80 mg via ORAL
  Filled 2017-03-19 (×7): qty 2

## 2017-03-19 MED ORDER — DOCUSATE SODIUM 100 MG PO CAPS
100.00 mg | ORAL_CAPSULE | Freq: Every day | ORAL | Status: DC
Start: 2017-03-20 — End: 2017-03-23
  Administered 2017-03-20: 11:00:00 100 mg via ORAL
  Filled 2017-03-19 (×4): qty 1

## 2017-03-19 MED ORDER — PIPERACILLIN-TAZOBACTAM IN DEX 3-0.375 GM/50ML IV SOLN
INTRAVENOUS | Status: AC
Start: 2017-03-19 — End: ?
  Filled 2017-03-19: qty 50

## 2017-03-19 MED ORDER — LATANOPROST 0.005 % OP SOLN
1.00 [drp] | Freq: Every evening | OPHTHALMIC | Status: DC
Start: 2017-03-19 — End: 2017-03-25
  Administered 2017-03-19 – 2017-03-24 (×4): 1 [drp] via OPHTHALMIC
  Filled 2017-03-19 (×3): qty 1

## 2017-03-19 MED ORDER — NITROGLYCERIN 0.4 MG SL SUBL
0.40 mg | SUBLINGUAL_TABLET | SUBLINGUAL | Status: DC | PRN
Start: 2017-03-19 — End: 2017-03-25

## 2017-03-19 MED ORDER — VANCOMYCIN HCL IN DEXTROSE 1-5 GM/200ML-% IV SOLN
1000.00 mg | Freq: Once | INTRAVENOUS | Status: AC
Start: 2017-03-19 — End: 2017-03-19
  Administered 2017-03-19: 04:00:00 1000 mg via INTRAVENOUS

## 2017-03-19 MED ORDER — IBUPROFEN 200 MG PO TABS
600.00 mg | ORAL_TABLET | Freq: Four times a day (QID) | ORAL | Status: DC | PRN
Start: 2017-03-19 — End: 2017-03-21

## 2017-03-19 MED ORDER — DILTIAZEM HCL ER COATED BEADS 240 MG PO CP24
240.00 mg | ORAL_CAPSULE | Freq: Every morning | ORAL | Status: DC
Start: 2017-03-19 — End: 2017-03-24
  Administered 2017-03-19 – 2017-03-24 (×3): 240 mg via ORAL
  Filled 2017-03-19 (×6): qty 1

## 2017-03-19 MED ORDER — ACETAMINOPHEN 500 MG PO TABS
ORAL_TABLET | ORAL | Status: AC
Start: 2017-03-19 — End: ?
  Filled 2017-03-19: qty 2

## 2017-03-19 MED ORDER — ACETAMINOPHEN 500 MG PO TABS
1000.00 mg | ORAL_TABLET | Freq: Once | ORAL | Status: AC
Start: 2017-03-19 — End: 2017-03-19
  Administered 2017-03-19: 01:00:00 1000 mg via ORAL

## 2017-03-19 MED ORDER — SENNOSIDES-DOCUSATE SODIUM 8.6-50 MG PO TABS
2.00 | ORAL_TABLET | Freq: Every evening | ORAL | Status: DC
Start: 2017-03-19 — End: 2017-03-25
  Filled 2017-03-19 (×7): qty 2

## 2017-03-19 MED ORDER — FUROSEMIDE 40 MG PO TABS
40.00 mg | ORAL_TABLET | Freq: Every day | ORAL | Status: DC
Start: 2017-03-19 — End: 2017-03-25
  Administered 2017-03-19 – 2017-03-25 (×4): 40 mg via ORAL
  Filled 2017-03-19 (×7): qty 1

## 2017-03-19 MED ORDER — METHOTREXATE SODIUM 2.5 MG PO TABS
10.00 mg | ORAL_TABLET | ORAL | Status: DC
Start: 2017-03-24 — End: 2017-03-25
  Administered 2017-03-24: 18:00:00 10 mg via ORAL
  Filled 2017-03-19: qty 4

## 2017-03-19 MED ORDER — PIPERACILLIN-TAZOBACTAM IN DEX 3-0.375 GM/50ML IV SOLN
3.3750 g | Freq: Once | INTRAVENOUS | Status: AC
Start: 2017-03-19 — End: 2017-03-19
  Administered 2017-03-19: 04:00:00 3.375 g via INTRAVENOUS

## 2017-03-19 MED ORDER — SODIUM CHLORIDE 0.9 % IV SOLN
750.00 mg | INTRAVENOUS | Status: DC
Start: 2017-03-20 — End: 2017-03-21
  Administered 2017-03-20 – 2017-03-21 (×2): 750 mg via INTRAVENOUS
  Filled 2017-03-19 (×2): qty 0.75

## 2017-03-19 MED ORDER — SODIUM CHLORIDE 0.9 % IJ SOLN
3.00 mL | Freq: Three times a day (TID) | INTRAMUSCULAR | Status: DC
Start: 2017-03-19 — End: 2017-03-25
  Administered 2017-03-19 – 2017-03-24 (×16): 3 mL via INTRAVENOUS

## 2017-03-19 MED ORDER — ALBUTEROL SULFATE HFA 108 (90 BASE) MCG/ACT IN AERS
2.00 | INHALATION_SPRAY | Freq: Two times a day (BID) | RESPIRATORY_TRACT | Status: DC | PRN
Start: 2017-03-19 — End: 2017-03-25

## 2017-03-19 MED ORDER — SODIUM CHLORIDE 0.9 % IJ SOLN
0.40 mg | INTRAMUSCULAR | Status: DC | PRN
Start: 2017-03-19 — End: 2017-03-25

## 2017-03-19 MED ORDER — ACETAMINOPHEN 650 MG RE SUPP
650.00 mg | RECTAL | Status: DC | PRN
Start: 2017-03-19 — End: 2017-03-25

## 2017-03-19 MED ORDER — FUROSEMIDE 10 MG/ML IJ SOLN
40.00 mg | Freq: Once | INTRAMUSCULAR | Status: DC
Start: 2017-03-19 — End: 2017-03-25
  Filled 2017-03-19: qty 4

## 2017-03-19 MED ORDER — VH VANCOMYCIN THERAPY PLACEHOLDER
Status: DC
Start: 2017-03-19 — End: 2017-03-21

## 2017-03-19 MED ORDER — LEVOTHYROXINE SODIUM 112 MCG PO TABS
112.00 ug | ORAL_TABLET | Freq: Every morning | ORAL | Status: DC
Start: 2017-03-19 — End: 2017-03-25
  Administered 2017-03-19 – 2017-03-25 (×4): 112 ug via ORAL
  Filled 2017-03-19 (×7): qty 1

## 2017-03-19 MED ORDER — VANCOMYCIN HCL IN DEXTROSE 1-5 GM/200ML-% IV SOLN
INTRAVENOUS | Status: AC
Start: 2017-03-19 — End: ?
  Filled 2017-03-19: qty 1000

## 2017-03-19 MED ORDER — LIDOCAINE HCL (PF) 1.5 % IJ SOLN
INTRAMUSCULAR | Status: AC
Start: 2017-03-19 — End: ?
  Filled 2017-03-19: qty 10

## 2017-03-19 MED ORDER — SODIUM CHLORIDE 0.9 % IV BOLUS
1000.00 mL | Freq: Once | INTRAVENOUS | Status: AC
Start: 2017-03-19 — End: 2017-03-19
  Administered 2017-03-19: 01:00:00 1000 mL via INTRAVENOUS

## 2017-03-19 MED ORDER — ACETAMINOPHEN 160 MG/5ML PO SOLN
650.00 mg | ORAL | Status: DC | PRN
Start: 2017-03-19 — End: 2017-03-25

## 2017-03-19 MED ORDER — PIPERACILLIN-TAZOBACTAM IN DEX 3-0.375 GM/50ML IV SOLN
3.3750 g | Freq: Three times a day (TID) | INTRAVENOUS | Status: DC
Start: 2017-03-19 — End: 2017-03-20
  Administered 2017-03-19 – 2017-03-20 (×4): 3.375 g via INTRAVENOUS
  Filled 2017-03-19 (×3): qty 50

## 2017-03-19 MED ORDER — VH BIO-K PLUS PROBIOTIC 50 BIL CFU CAPSULE
50.00 | DELAYED_RELEASE_CAPSULE | Freq: Every day | ORAL | Status: DC
Start: 2017-03-19 — End: 2017-03-25
  Administered 2017-03-20 – 2017-03-25 (×3): 50 via ORAL
  Filled 2017-03-19 (×4): qty 1

## 2017-03-19 MED ORDER — LIDOCAINE HCL (PF) 1 % IJ SOLN
10.00 mL | Freq: Once | INTRAMUSCULAR | Status: AC
Start: 2017-03-19 — End: 2017-03-19
  Administered 2017-03-19: 10:00:00 10 mL via INTRADERMAL

## 2017-03-19 MED ORDER — ACETAMINOPHEN 325 MG PO TABS
650.00 mg | ORAL_TABLET | ORAL | Status: DC | PRN
Start: 2017-03-19 — End: 2017-03-25
  Administered 2017-03-25 (×2): 650 mg via ORAL
  Filled 2017-03-19 (×2): qty 2

## 2017-03-19 MED ORDER — VH PIPERACILLIN-TAZOBACTAM 3.375 G EXTENDED INFUSION (SIMPLE)
3.3750 g | Freq: Three times a day (TID) | Status: DC
Start: 2017-03-19 — End: 2017-03-19

## 2017-03-19 MED ORDER — ASPIRIN 81 MG PO CHEW
81.00 mg | CHEWABLE_TABLET | Freq: Every day | ORAL | Status: DC
Start: 2017-03-19 — End: 2017-03-25
  Administered 2017-03-20 – 2017-03-25 (×3): 81 mg via ORAL
  Filled 2017-03-19 (×7): qty 1

## 2017-03-19 NOTE — ED Notes (Signed)
Bed: N5-A  Expected date: 03/19/17  Expected time: 12:59 AM  Means of arrival:   Comments:  EMS CP 36 female

## 2017-03-19 NOTE — ED Notes (Signed)
Obtained Blood Culture #1 at this time, prepped skin with two alcohol pads and 4 chlorhexidine swabs. Culture tubes were cleaned with an alcohol pad and the pad kept on the tops until ready for use. Pt tolerated procedure well.

## 2017-03-19 NOTE — Progress Notes (Signed)
Pharmacy Vancomycin Dosing Consult Note  Kristina [name]    Assessment:   1. Day #1 vancomycin for this 84 yoF admitted from nursing home with chest pain and SOB.  CXR w/ right pleural effusion and right basilar atelectasis.  IR consulted for thoracentesis.  2. WBC 11.8, Tmax 100.15F, renal function moderately impaired. Blood culture in progress.     Plan:   1. Vancomycin 750 mg IV q24H.  2. Trough level TBD.  3. Pharmacy will follow the patient's renal function, vancomycin levels, and dosing during the course of therapy. If you have any questions, please contact the pharmacist at [phone].     Indication: PNA    Age: 84 y.o.  Height:  155 cm  Weight:   50.6 kg  IBW: 47.8 kg  DW: 50.6 kg    CrCl: 41 ml/min     Population Estimate Pharmacokinetics:   Kel: 0.0384 hr-1        t50: 18 hrs        Vd: 35 L         Expected Trough: 14 mg/L              Date Regimen Trough Date/Time Trough Level Pt Specific Ke   1/2 750 mg q24h                               Recent Labs  Lab 03/19/17  0112   Creatinine 0.81   BUN 14       Recent Labs  Lab 03/19/17  0112   WBC 11.8*     Temp (24hrs), Avg:99.2 F (37.3 C), Min:97.8 F (36.6 C), Max:100.9 F (38.3 C)      Cultures:       Date Source Organism Sensitivities Resistance   1/2 Blood

## 2017-03-19 NOTE — Plan of Care (Signed)
Problem: Inadequate Gas Exchange  Goal: Adequate oxygenation and improved ventilation  Outcome: Progressing  Patient resting in bed for duration of shift. Patient denies pain and discomfort.

## 2017-03-19 NOTE — Plan of Care (Signed)
Problem: Compromised Hemodynamic Status  Goal: Vital signs and fluid balance maintained/improved  Interventions:  1. Position patient for maximum circulation / cardiac output  2. Monitor and assess vitals and hemodynamic parameters with position changes  3. Monitor and compare daily weight  4. Monitor intake and output.  Notify LIP if urine output is less than 30 mL/hour   5. Monitor/assess lab values and report abnormal values  Outcome: Progressing   03/19/17 0556   Goal/Interventions addressed this shift   Vital signs and fluid balance are maintained/improved Position patient for maximum circulation/cardiac output;Monitor/assess vitals and hemodynamic parameters with position changes;Monitor and compare daily weight;Monitor intake and output. Notify LIP if urine output is less than 30 mL/hour.;Monitor/assess lab values and report abnormal values

## 2017-03-19 NOTE — ED Notes (Signed)
Pt taken off bedpan, no urine output, small BM. Pt denies need to urinate at this time.

## 2017-03-19 NOTE — ED Notes (Signed)
Daughter at bedside.

## 2017-03-19 NOTE — Progress Notes (Signed)
Patient's head CT and CXR completed; patient returned to Emergency Department and RN there updated.

## 2017-03-19 NOTE — Plan of Care (Signed)
Problem: Moderate/High Fall Risk Score >5  Goal: Patient will remain free of falls   03/19/17 0557   OTHER   Moderate Risk (6-13) LOW-Fall Interventions Appropriate for Low Fall Risk

## 2017-03-19 NOTE — ED Provider Notes (Signed)
ED DOC NOTE     History provided by the patient and EMS    History is limited by none    HPI     84 y.o. female  Presents via EMS from [facility] for mid sternal chest pain. Per EMS, the patient was given 3 nitro there without relief. She refused aspirin at that time. She has a history of chronic a-fib, chronic chest pain, CHF, hypertension, and disorder of thyroid.   Recent admission for CVA. Discontinued eliquis.     Onset: today  Worsened by nothing  Relieved by nothing  Quality: none  Radiation: none  Severity: moderate    Associated symptoms : none    Review of Systems   Review of Systems   Constitutional: Negative for appetite change, diaphoresis and fever.   HENT: Negative for congestion, rhinorrhea, sinus pressure and trouble swallowing.    Eyes: Negative for pain, discharge and itching.   Respiratory: Negative for apnea, cough and shortness of breath.    Cardiovascular: Positive for chest pain. Negative for palpitations and leg swelling.   Gastrointestinal: Negative for abdominal distention, abdominal pain, diarrhea, nausea and vomiting.   Endocrine: Negative.    Genitourinary: Negative for difficulty urinating, dysuria and hematuria.   Musculoskeletal: Negative for arthralgias, back pain, myalgias and neck pain.   Skin: Negative for rash and wound.   Neurological: Negative for dizziness, seizures, syncope, speech difficulty and headaches.   Psychiatric/Behavioral: Negative for agitation, behavioral problems and suicidal ideas.           PAST MEDICAL/SURGICAL HISTORY:  Past Medical History:   Diagnosis Date   . A-fib    . Aphasia    . Bradycardia    . Cerebral infarction    . Chest pain    . Congestive heart failure    . Diarrhea    . Disorder of thyroid     hypo   . Hyperlipidemia    . Hypertension    . Melanoma    . Rheumatoid arthritis    . Vitamin D deficiency       Past Surgical History:   Procedure Laterality Date   . arm surgery      roght   . HIP SURGERY      left     Additional Past  Medical/Surgical History:       SOCIAL AND FAMILY HISTORY:  Social History   Substance Use Topics   . Smoking status: Never Smoker   . Smokeless tobacco: Never Used   . Alcohol use No     History reviewed. No pertinent family history.     Additional Social and Family History:  Additional SH: [x]   I reviewed SH above       Additional FH: [x]  I reviewed FH above    Allergies   Allergen Reactions   . Lisinopril Anaphylaxis     Additional Allergies:    Prior to Admission medications    Medication Sig Start Date End Date Taking? Authorizing Provider   aspirin 81 MG chewable tablet Chew 1 tablet (81 mg total) by mouth daily. 03/05/17   Marlou Sa, MD   atorvastatin (LIPITOR) 80 MG tablet Take 1 tablet (80 mg total) by mouth nightly. 03/04/17   Marlou Sa, MD   dilTIAZem (CARDIZEM CD) 240 MG 24 hr capsule Take 240 mg by mouth every morning.     01/31/17   [provider]   fluticasone (FLONASE) 50 MCG/ACT nasal spray 1 spray by Nasal  route daily as needed for Rhinitis or Allergies.        [provider]   folic acid (FOLVITE) 1 MG tablet Take 1 mg by mouth daily.    [provider]   lactase (LACTAID) 3000 units tablet Take 1 tablet by mouth 3 (three) times daily with meals.    [provider]   lactobacillus species (BIO-K PLUS) capsule Take 1 capsule (50 Billion CFU total) by mouth daily. 02/22/17 03/24/17  Theda Sers, MD   latanoprost (XALATAN) 0.005 % ophthalmic solution Place 1 drop into both eyes nightly.        [provider]   levothyroxine (SYNTHROID, LEVOTHROID) 112 MCG tablet Take 112 mcg by mouth every morning.     01/20/17   [provider]   LUMIGAN 0.01 % Solution Place 1 drop into both eyes nightly. 01/31/17   [provider]   methotrexate 2.5 MG tablet Take 10 mg by mouth Once a week on Monday evening.     01/21/17   [provider]   nitroglycerin (NITROSTAT) 0.4 MG SL tablet Place 0.4 mg under the tongue every 5 (five) minutes  as needed for Chest pain.     01/31/17   [provider]   predniSONE (DELTASONE) 20 MG tablet Take 1 tablet (20 mg total) by mouth every morning with breakfast.For 5 days 03/05/17   Marlou Sa, MD   [facility] HFA 108 [phone] Base) MCG/ACT inhaler Inhale 2 puffs into the lungs 2 (two) times daily as needed for Wheezing or Shortness of Breath.     12/31/16   [provider]   SIMBRINZA 1-0.2 % Suspension Place 1 drop into both eyes every morning. 01/31/17   [provider]       PHYSICAL EXAM   Vitals:    03/19/17 0531   BP: (!) 126/91   Pulse: (!) 104   Resp: 21   Temp: 97.8 F (36.6 C)   SpO2: 95%     [x]  Nursing note reviewed [x]  Vitals reviewed   Pulse Oximetry on Nasal Cannula Normal  Physical Exam   Constitutional: She appears well-developed and well-nourished. No distress.   HENT:   Head: Normocephalic and atraumatic.   Right Ear: External ear normal.   Left Ear: External ear normal.   Nose: Nose normal.   Mouth/Throat: Oropharynx is clear and moist. No oropharyngeal exudate.   Eyes: Pupils are equal, round, and reactive to light. Conjunctivae and EOM are normal. Right eye exhibits no discharge. Left eye exhibits no discharge. No scleral icterus.   Neck: Normal range of motion. Neck supple.   Cardiovascular: Normal heart sounds and intact distal pulses.  An irregularly irregular rhythm present. Tachycardia present.  Exam reveals no gallop and no friction rub.    No murmur heard.  Pulmonary/Chest: Effort normal and breath sounds normal. She has no wheezes. She has no rales.   Abdominal: Soft. Bowel sounds are normal. She exhibits no distension. There is no tenderness. There is no rebound and no guarding.   Musculoskeletal: Normal range of motion. She exhibits edema (chronic, pitting, BLE. ). She exhibits no tenderness.   Neurological: She has normal reflexes.   A&Ox2   Skin: Skin is warm and dry. No rash noted. She is not diaphoretic. No pallor.   Psychiatric: She has a normal mood and  affect. Her behavior is normal.   Nursing note and vitals reviewed.      LABS/TESTS:  Results  Procedure Component Value Units Date/Time    Blood Culture - Venipuncture # 1 [[id number]] Collected:  03/19/17 0144    Specimen:  Blood from Venipuncture Updated:  03/19/17 0617    Narrative:       Specimen/Source: Blood/Venipuncture  Collected: 03/19/2017 01:44     Status: Valued      Last Updated: 03/19/2017 06:15                Culture Result (Prelim)      Culture In Progress          Troponin I (Stat) [[id number]] Collected:  03/19/17 0414    Specimen:  Plasma Updated:  03/19/17 0508     Troponin I 0.02 ng/mL     BNP [[id number]]  (Abnormal) Collected:  03/19/17 0112    Specimen:  Blood Updated:  03/19/17 0147     B-Natriuretic Peptide 383.7 (H) pg/mL     Troponin I (Stat) [[id number]] Collected:  03/19/17 0112    Specimen:  Plasma Updated:  03/19/17 0146     Troponin I 0.02 ng/mL     CMP [[id number]]  (Abnormal) Collected:  03/19/17 0112    Specimen:  Plasma Updated:  03/19/17 0144     Sodium 136 mMol/L      Potassium 3.6 mMol/L      Chloride 98 mMol/L      CO2 27.3 mMol/L      Calcium 9.6 mg/dL      Glucose 315 (H) mg/dL      Creatinine 1.76 mg/dL      BUN 14 mg/dL      Protein, Total 6.6 gm/dL      Albumin 3.4 (L) gm/dL      Alkaline Phosphatase 119 U/L      ALT 21 U/L      AST (SGOT) 24 U/L      Bilirubin, Total 0.8 mg/dL      Albumin/Globulin Ratio 1.06 Ratio      Anion Gap 14.3 mMol/L      BUN/Creatinine Ratio 17.3 Ratio      EGFR 66 mL/min/1.83m2      Osmolality Calc 274 (L) mOsm/kg      Globulin 3.2 gm/dL     Influenza A / B Rapid Test [[id number]] Collected:  03/19/17 0114    Specimen:  Nasal Wash Updated:  03/19/17 0141     Influenza A Negative     Influenza B Negative    Narrative:       Influenza A antigen detection tests are unable to distinquish between novel and seasonal influenza A.    A negative result for either Influenza A or B antigen does not exclude influenza virus infection. Clinical correlation  required.    All positive influenza antigen tests (A or B) require placement of patient on droplet precaution isolation.    PT/INR [[id number]]  (Abnormal) Collected:  03/19/17 0112    Specimen:  Blood Updated:  03/19/17 0133     PT 11.9 (H) sec      PT INR 1.2    i-Stat Lactic AcID [[id number]] Collected:  03/19/17 0123    Specimen:  Venipuncture Updated:  03/19/17 0126     Room Number, ISTAT 05     Sample, ISTAT Venous     Site, ISTAT OTHER     i-STAT Lactic acid 1.55 mMol/L     CBC [[id number]]  (Abnormal) Collected:  03/19/17 0112    Specimen:  Blood from Blood Updated:  03/19/17 0125  WBC 11.8 (H) K/cmm      RBC 4.13 M/cmm      Hemoglobin 14.6 gm/dL      Hematocrit 16.1 %      MCV 103 (H) fL      MCH 35 pg      MCHC 34 gm/dL      RDW 09.6 %      PLT CT 262 K/cmm      MPV 7.7 fL      NEUTROPHIL % 73.4 %      Lymphocytes 17.3 %      Monocytes 8.5 %      Eosinophils % 0.4 %      Basophils % 0.5 %      Neutrophils Absolute 8.7 (H) K/cmm      Lymphocytes Absolute 2.0 K/cmm      Monocytes Absolute 1.0 K/cmm      Eosinophils Absolute 0.0 K/cmm      BASO Absolute 0.1 K/cmm     I-Stat Lactic Acid [[id number]] Collected:  03/19/17 0120    Specimen:  ISTAT Updated:  03/19/17 0121     I-STAT Notification Istat Notification            Xr Chest Ap Portable    Result Date: 03/19/2017  Stable right pleural effusion and right basilar atelectasis. Stable cardiomegaly. ReadingStation:[id number]            EKG:   I have interpreted the EKG at the time it was performed and my official reading is as follows:  Last EKG Result     Procedure Component Value Units Date/Time    ECG 12 lead (Specify reason for exam) [[id number]] Collected:  03/19/17 0105     Updated:  03/19/17 0115     Patient Age 20 years      Patient DOB 11/05/1929     Patient Height --     Patient Weight --     Interpretation Text --     Atrial fibrillation      Electronically Signed On 03-19-2017 1:15:00 EST by Elwin Mocha       Physician Interpreter [facility]      Ventricular Rate 127 //min      QRS Duration 77 ms      P-R Interval -- ms      Q-T Interval 372 ms      Q-T Interval(Corrected) 541 ms      P Wave Axis -- deg      QRS Axis -27 deg      T Axis 132 years             ED COURSE:  BP (!) 126/91   Pulse (!) 104   Temp 97.8 F (36.6 C) (Oral)   Resp 21   SpO2 95%   ED Course as of Mar 20 639   Wed Mar 19, 2017   0156 i-STAT Lactic acid: 1.55 [SB]   0156 improved B-Natriuretic Peptide: (!) 383.7 [SB]   0156 Troponin I: 0.02 [SB]   0402 Paged Dr. Kerin Perna twice no response, hospital operator will call his portable phone  [SB]      ED Course User Index  [SB] Harriette Bouillon, MD         PROCEDURES:        IMPRESSION:     84 y.o. female with  1. Pneumonia due to infectious organism, unspecified laterality, unspecified part of lung        DIFFERENTIAL DIAGNOSIS:  ACUTE MYOCARDIAL INFARCTION,  UNSTABLE ANGINA, ESOPHAGEAL RUPTURE, PNEUMOTHORAX, PNEUMONIA, PERICARDITIS, PERCIARDIAL EFFUSION, MYOCARDITIS, CHEST WALL PAIN, PULMONARY EMBOLUS.      MEDICAL DECISION MAKING:   I have evaluated all labs and imaging studies and have determined that the patient requires admission to the hospital for further evaluation and treatment of   1. Pneumonia due to infectious organism, unspecified laterality, unspecified part of lung    .  I have explained all results to the patient and all questions have been answered.  The patient have been informed of the need for admission and they are in agreement with this plan.      PLAN:   ED Disposition     ED Disposition Condition Date/Time Comment    Admit  Wed Mar 19, 2017  5:45 AM Admitting Physician: Genevieve Norlander [[id number]]   Diagnosis: Pleural effusion due to congestive heart failure [604540]   Estimated Length of Stay: > or = to 2 midnights   Tentative Discharge Plan?: Home or Self Care [1]   Patient Class: Inpatient [101]          New Prescriptions    No medications on file       The documentation recorded by my scribe, Darvin Neighbours,  accurately reflects the services I personally performed and the decisions made by me. Elwin Mocha, MD.        Harriette Bouillon, MD  03/19/17 [phone]

## 2017-03-19 NOTE — ED Notes (Addendum)
Pt arrived via EMS c/o chest pain since this evening. Pt reports pain rated 5/10. Denies SOB, referred pain, HA, n/v, dizziness. Pt hashx a fib, CHF, hypothyroidism

## 2017-03-19 NOTE — Progress Notes (Signed)
PROCEDURE:   THORACENTESIS    SIDE RT    FR  4       TOTAL VOLUME REMOVED 1200  SAMPLE SENT TO LAB [facility]    *PLEASE BE ADVISED ON INPATIENTS IT IS THE RESPONSIBILITY OF THE PHYSICIAN WHO ORDERED THE PROCEDURE TO ORDER THE LAB WORK ON THE FLUID. RADIOLOGY IS ONLY PROVIDING THE SPECIMEN*      APPEARANCE OF FLUID Clear Yellow    PT PENDING CHEST XRAY AT THIS TIME     NO IMMEDIATE COMPLICATIONS.    Tylene Fantasia RPA/RA [phone]

## 2017-03-19 NOTE — Plan of Care (Signed)
Problem: Inadequate Gas Exchange  Goal: Adequate oxygenation and improved ventilation  Interventions  1. Assess lung sounds   2. Monitor SpO2 and treat as needed  3. Monitor and treat ETCO2  4. Provide mechanical and oxygen support to facilitate gas exchange  5. Position for maximum ventilatory efficiency  6. Teach/reinforce use of incentive spirometer 10 times per hour while awake, cough and deep breath as needed  7. Plan activities to conserve energy: plan rest periods  8. Increase activity as tolerated/progressive mobility  9. Consult/collaborate with Respiratory Therapy   Outcome: Progressing   03/19/17 0557   Goal/Interventions addressed this shift   Adequate oxygenation and improved ventilation Assess lung sounds;Monitor SpO2 and treat as needed;Monitor and treat ETCO2;Provide mechanical and oxygen support to facilitate gas exchange;Position for maximum ventilatory efficiency;Teach/reinforce use of incentive spirometer 10 times per hour while awake, cough and deep breath as needed;Plan activities to conserve energy: plan rest periods;Increase activity as tolerated/progressive mobility;Consult/collaborate with Respiratory Therapy

## 2017-03-19 NOTE — ED Notes (Signed)
X-ray at bedside

## 2017-03-19 NOTE — SLP Eval Note (Signed)
[facility]  Speech Language Pathology   Bedside Swallow Evaluation     Patient: Kristina [name]      CSN#: [id number]   Referring Physician:  Genevieve Norlander, MD Date of Referral:  03/19/17    SLP reason:  Consult received for SLP Bedside Swallow Evaluation and Treatment for failed 3oz water screen.  PMHx significant for L frontal CVA last month.  Chest XR demonstrating RLL airspace disease.    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    Administration of Medications: Meds whole in applesauce/pureed    Precautions/Compensations:  Position patient upright 90 degrees for all po intake/meds, Take small bites/sips, Eat/feed slowly, Aspiration precautions, Reflux precautions  Suggestions for Feeding:  Distant supervision  Prognosis:  Fair d/t multiple medical complications, advanced age  Aspiration Risk: hx CVA, age >60, failed 3oz screen with NSG  Modified Barium Swallow Study recommended: no;Not indicated at this time  SLP Follow-Up Treatment needs: 1-2x; diet tolerance assessment  Duration of Treatment: Until swallowing managed  Projected Discharge Recommendations:  To be determined pending medical course  Other referrals:  n/a    Goals:  Pt will:  STG:  (In 3-4 days)  1. Pt will tolerate a REGULAR diet with THIN liquids free of signs/symptoms of aspiration. (NEW)  2. Pt/family/staff education (NEW)    LTG: (By discharge)  1. Pt will tolerate the least restrictive diet free of signs/symptoms of aspiration.  (NEW)      HISTORY OF PRESENT ILLNESS:       Medical Diagnosis: Pneumonia due to infectious organism, unspecified laterality, unspecified part of lung [J18.9]    History of Present Illness: Kristina [name] is a 84 y.o. female admitted on 03/19/2017 with   Patient Active  Problem List   Diagnosis   . Hematochezia   . PAF (paroxysmal atrial fibrillation)   . HTN (hypertension)   . Hypokalemia   . Pleural effusion, right   . Hypothyroidism   . Chronic systolic (congestive) heart failure   . Cerebrovascular accident (CVA) with intracranial hemorrhage   . Acute CVA (cerebrovascular accident)   . Pleural effusion due to congestive heart failure        Past Medical/Surgical History:  Past Medical History:   Diagnosis Date   . A-fib    . Aphasia    . Bradycardia    . Cerebral infarction    . Chest pain    . Congestive heart failure    . Diarrhea    . Disorder of thyroid     hypo   . Hyperlipidemia    . Hypertension    . Melanoma    . Rheumatoid arthritis    . Vitamin D deficiency       Past Surgical History:   Procedure Laterality Date   . arm surgery      roght   . HIP SURGERY      left         Baseline cognitive-communication/swallowing status:  Impaired communication skills at baseline d/t aphasia from CVA in Dec 2018.  Previous bedside swallow eval completed with dx of functional oropharyngeal swallow.    SUBJECTIVE:      Subjective: Patient is agreeable to participation in the therapy session. Nursing clears patient for therapy. Patient's medical condition is  appropriate for Speech therapy intervention at this time.    S:   "No trouble."  (re: swallowing)      OBJECTIVE:     Observation of Patient/Vital Signs:    Positioning: Patient is upright in bed.  Respiratory status: 3L O2 via nasal cannula  Source of nutrition at time of eval:  NPO without access  Other medical equipment in place: IV   O:    Oral Motor/Swallow Skills:    Dentition: Mostly edentulous; pt reports upper dentures at home  Strength:  WFL  Coordination:  WFL  ROM: WFL  Symmetry:  Mild R lingual deviation upon protrusion from prior CVA   Oral Apraxia:  N/A  Sensation:  No overt sensory impairment noted; failed 3oz water screen with NSG - will monitor for silent aspiration risk with MBSS as indicated  Gag Reflex:  Not  assessed  Laryngeal Elev:  +2-3 of 4 fingers width laryngeal elevation palpated, mildly delayed initiation of the swallow reflex  Cough/Grunt:  strong cough on command, reflexive cough not elicited  Motor Speech/Voice:  WFL    Consistencies presented:  Pureed:   tsp x2; 1tsp x1  No signs/symptoms of aspiration, no change in respiratory status, no vocal change, tongue pumping with mild delay  Dysph Mech:   tsp x1; 1tsp x2  No signs/symptoms of aspiration, no change in respiratory status, no vocal change, prolonged but functional mastication  Masticated:   tsp x1  No signs/symptoms of aspiration, no change in respiratory status, no vocal change, prolonged but functional mastication  Thin Liquid: Straw x12 (attempted 3oz water test - pt unable to drink all 3oz in its entirety)  No signs/symptoms of aspiration, no change in respiratory status, no vocal change, mildly delayed initiation of the swallow reflex      Pt/Family/Caregiver Education Provided:     Patient was/were educated re: role of slp, purpose of evaluation, sign/symptoms/risks of aspiration, results/recommendations of today's evaluation, tx goals  Good understanding was verbalized/demonstrated: yes  Comprehension limited by: none    Team Communication:     Spoke to : RN/LPN - Cleotis Nipper  Regarding: results/recommendations of today's evaluation, tx goals  Good understanding was verbalized: yes      Time of Treatment:   SLP Received On: 03/19/17  Start Time: 1849  Stop Time: 1905  Time Calculation (min): 16 min    SLP Visit Number: 1      Evaluation was completed by:  Harrold Donath, M.S., CCC-SLP  Pager [phone]  Speech-Language Pathologist  [facility]

## 2017-03-19 NOTE — Consults (Signed)
thora ordered

## 2017-03-19 NOTE — Progress Notes (Signed)
PROGRESS NOTE - [facility]    Date Time: 03/19/17 4:53 PM  Patient Name: Kristina [name]  Attending Physician: Lorrin Goodell, MD    Assessment and Plan                                                       [facility]     Principal Problem:    Pleural effusion, right  Active Problems:    PAF (paroxysmal atrial fibrillation)    HTN (hypertension)    Hypothyroidism    Chronic systolic (congestive) heart failure    Cerebrovascular accident (CVA) with intracranial hemorrhage    Pleural effusion due to congestive heart failure  Resolved Problems:    * No resolved hospital problems. *    Pleural effusion, right:Likely t parapneumonic effusion with no mass seen on the CT 02/20/2017  -s/p Thoracentesis ,f/u with fluid (LDH, Protein, Glucose, Gram Stain, Cell Count)  -ECHO showing both LV failure EF 40% and RV failure. The patient also has severe tricuspid regurgitation  -IV lasix 40mg     Cerebral infarction with hemorrhagic conversion   -Repeat CT to determine anticoagulation  -MRI on 12/13 was acute cortical infarction of the left operculofrontal region with hemorrhagic features without significant mass effect    Right/Left Congestive Heart Failure  -Very significant right atrium and right ventricular enlargement with signs of right heart failure (CT 12/6)  -ECHO 12/6 showing both LV failure EF 40% and RV failure with severe tricuspid regurgitation   continue lasix    ID: [name]/o infectious cause   -Procalcitonin  -S/p Vanc/Zosyn    PAF (paroxysmal atrial fibrillation)  -continue Cardizem  -No anticoagulation at this time, due for repeat CT of head and neck to reevaluate (per Dr. Nunzio Cory on previous admission)    HTN (hypertension)  -Continue home medications,     HLD  -Cont atorvasatain    Hypothyroidism  - qd    Rheumatoid Arthritis  -Methotrexate    DVT Prophylaxis:Hx of recent GI Bleed, and hemorrhagic conversion  Diet: Regular    Code Status:DNR  PT eval.      Subjective                                                                           [facility]        stable,sob better,denies cp    Review Of Systems:                                                                          [facility]     All other systems were reviewed and are negative     Physical Exam:  [facility]     Temp:  [97.8 F (36.6 C)-100.9 F (38.3 C)] 99.3 F (37.4 C)  Heart Rate:  [52-125] 90  Resp Rate:  [15-27] 15  BP: (106-141)/(61-91) 133/78    Intake/Output Summary (Last 24 hours) at 03/19/17 1653  Last data filed at 03/19/17 1306   Gross per 24 hour   Intake             1050 ml   Output                1 ml   Net             1049 ml       General: awake, alert, oriented ; no acute distress,fatigued,gen.weakness  HEENT: perrla, eomi, sclera anicteric  oropharynx clear without lesions, mucous membranes moist  Glands: No cervical or axillary lymphadenopathy.  Neck: supple, no lymphadenopathy, no thyromegaly, no JVD, no carotid bruits  Cardiovascular: regular rate and rhythm, no rubs or gallops  Lungs: clear to auscultation bilaterally, without wheezing, rhonchi, or rales  Abdomen: soft, non-tender, non-distended; no palpable masses, no hepatosplenomegaly, normoactive bowel sounds, no rebound or guarding  Extremities: no clubbing, cyanosis, or edema  Neuro: cranial nerves grossly intact, strength 2-3/5 in upper and lower extremities, sensation intact,   Psych: Normal affect, not depressed   Skin: no rashes or lesions noted      Meds:                                                                                  [facility]       Current Facility-Administered Medications   Medication Dose Route Frequency Provider Last Rate Last Dose   . acetaminophen (TYLENOL) tablet 650 mg  650 mg Oral Q4H PRN Hwig, Nauras, MD        Or   . acetaminophen (TYLENOL) 160 MG/5ML oral solution 650 mg  650 mg per NG tube Q4H PRN Hwig, Nauras, MD         Or   . acetaminophen (TYLENOL) suppository 650 mg  650 mg Rectal Q4H PRN Hwig, Nauras, MD       . albuterol (PROVENTIL HFA;VENTOLIN HFA) inhaler 2 puff  2 puff Inhalation BID PRN Hwig, Nauras, MD       . aspirin chewable tablet 81 mg  81 mg Oral Daily Hwig, Nauras, MD   Stopped at 03/19/17 1009   . atorvastatin (LIPITOR) tablet 80 mg  80 mg Oral QHS Hwig, Nauras, MD       . dilTIAZem (CARDIZEM CD) 24 hr capsule 240 mg  240 mg Oral QAM Hwig, Nauras, MD   240 mg at 03/19/17 0922   . [START ON 03/20/2017] docusate sodium (COLACE) capsule 100 mg  100 mg Oral Daily Hwig, Nauras, MD       . folic acid (FOLVITE) tablet 1 mg  1 mg Oral Daily Hwig, Nauras, MD       . furosemide (LASIX) injection 40 mg  40 mg Intravenous Once Hwig, Nauras, MD       . ibuprofen (ADVIL,MOTRIN) tablet 600 mg  600 mg Oral Q6H PRN Hwig, Nauras, MD       .  lactobacillus species (BIO-K PLUS) capsule 50 Billion CFU  50 Billion CFU Oral Daily Hwig, Nauras, MD       . latanoprost (XALATAN) 0.005 % ophthalmic solution 1 drop  1 drop Both Eyes QHS Hwig, Nauras, MD       . levothyroxine (SYNTHROID, LEVOTHROID) tablet 112 mcg  112 mcg Oral QAM Hwig, Nauras, MD   112 mcg at 03/19/17 0922   . [START ON 03/24/2017] methotrexate tablet 10 mg  10 mg Oral Q MONDAY (PM) Hwig, Nauras, MD       . naloxone (NARCAN) injection 0.4 mg  0.4 mg Intravenous PRN Hwig, Nauras, MD       . nitroglycerin (NITROSTAT) SL tablet 0.4 mg  0.4 mg Sublingual Q5 Min PRN Hwig, Nauras, MD       . piperacillin-tazobactam (ZOSYN) IVPB 3.375 g  3.375 g Intravenous Q8H Harriette Bouillon, MD 100 mL/hr at 03/19/17 1621 3.375 g at 03/19/17 1621   . senna-docusate (PERICOLACE) 8.6-50 MG per tablet 2 tablet  2 tablet Oral QHS Hwig, Nauras, MD       . sodium chloride (PF) 0.9 % injection 3 mL  3 mL Intravenous Q8H Hwig, Nauras, MD   3 mL at 03/19/17 1352   . [START ON 03/20/2017] vancomycin (VANCOCIN) 750 mg in sodium chloride 0.9 % 250 mL IVPB (vial-mate)  750 mg Intravenous Q24H Afridi, Larita Fife,  MD       . vancomycin therapy placeholder   Does not apply See Admin Instructions Lorrin Goodell, MD         Current Outpatient Prescriptions   Medication Sig Dispense Refill   . acetaminophen (TYLENOL) 325 MG tablet Take 650 mg by mouth every 4 (four) hours as needed for Pain.     . furosemide (LASIX) 20 MG tablet Take 20 mg by mouth daily.     Marland Kitchen loperamide (IMODIUM) 2 MG capsule Take 2 mg by mouth as needed for Diarrhea.     . Menthol-Zinc Oxide (CALMOSEPTINE) 0.44-20.6 % Ointment ointment Apply topically daily.     . potassium chloride (MICRO-K) 10 MEQ CR capsule Take 20 mEq by mouth daily.     . traMADol (ULTRAM) 50 MG tablet Take 50 mg by mouth every 6 (six) hours as needed for Pain.     Marland Kitchen aspirin 81 MG chewable tablet Chew 1 tablet (81 mg total) by mouth daily.     Marland Kitchen atorvastatin (LIPITOR) 80 MG tablet Take 1 tablet (80 mg total) by mouth nightly.     . dilTIAZem (CARDIZEM CD) 240 MG 24 hr capsule Take 240 mg by mouth every morning.         . fluticasone (FLONASE) 50 MCG/ACT nasal spray 1 spray by Nasal route daily as needed for Rhinitis or Allergies.         . folic acid (FOLVITE) 1 MG tablet Take 1 mg by mouth daily.     Marland Kitchen lactase (LACTAID) 3000 units tablet Take 1 tablet by mouth daily as needed.         . lactobacillus species (BIO-K PLUS) capsule Take 1 capsule (50 Billion CFU total) by mouth daily. 30 capsule 0   . latanoprost (XALATAN) 0.005 % ophthalmic solution Place 1 drop into the right eye nightly.         . levothyroxine (SYNTHROID, LEVOTHROID) 112 MCG tablet Take 112 mcg by mouth every morning.         Marland Kitchen LUMIGAN 0.01 % Solution Place 1 drop into both eyes  nightly.     . methotrexate 2.5 MG tablet Take 10 mg by mouth Once a week on Monday evening.         . nitroglycerin (NITROSTAT) 0.4 MG SL tablet Place 0.4 mg under the tongue every 5 (five) minutes as needed for Chest pain.         . predniSONE (DELTASONE) 20 MG tablet Take 1 tablet (20 mg total) by mouth every morning with breakfast.For 5 days  (Patient taking differently: Take 5 mg by mouth daily.For 5 days    )     . PROAIR HFA 108 (90 Base) MCG/ACT inhaler Inhale 2 puffs into the lungs 2 (two) times daily as needed for Wheezing or Shortness of Breath.         Marland Kitchen SIMBRINZA 1-0.2 % Suspension Place 1 drop into both eyes every morning.         Labs and Imaging:                                                              [facility]       RECENT LABS (from the last 7 days)    Recent Labs  Lab 03/19/17  0112   WBC 11.8*   RBC 4.13   Hemoglobin 14.6   Hematocrit 42.4   MCV 103*   PLT CT 262       Recent Labs  Lab 03/19/17  0112   PT 11.9*   PT INR 1.2       Recent Labs  Lab 03/19/17  0414 03/19/17  0112   Troponin I 0.02 0.02      Recent Labs  Lab 03/19/17  0112   Glucose 128*   Sodium 136   Potassium 3.6   Chloride 98   CO2 27.3   BUN 14   Creatinine 0.81   EGFR 66   Calcium 9.6           Recent Labs  Lab 03/19/17  0414 03/19/17  0112   Albumin  --  3.4*   Protein, Total 5.4* 6.6   Bilirubin, Total  --  0.8   Alkaline Phosphatase  --  119   ALT  --  21   AST (SGOT)  --  24          Invalid input(s):  AMORPHOUSUA   Lab Results   Component Value Date    HGBA1CPERCNT 5.7 02/24/2017            Microbiology, reviewed and are significant for:  Microbiology Results     Procedure Component Value Units Date/Time    Blood Culture - Venipuncture # 1 [[id number]] Collected:  03/19/17 0144    Specimen:  Blood from Venipuncture Updated:  03/19/17 0617    Narrative:       Specimen/Source: Blood/Venipuncture  Collected: 03/19/2017 01:44     Status: Valued      Last Updated: 03/19/2017 06:15                Culture Result (Prelim)      Culture In Progress          Influenza A / B Rapid Test [[id number]] Collected:  03/19/17 0114    Specimen:  Nasal Wash Updated:  03/19/17 0141     Influenza  A Negative     Influenza B Negative     Comment: Method: Jarvis Morgan    The sensitivity for this method is between 90% and 95% for Influenza A and around 90% for Influenza B. The  specificity for both Influenza A and B is around 96%. False positive results may occur, especially when the prevalence of Influenza activity is low. This is more likely with Influenza B due to its lower prevalence. Clinical conditions, including the prevalence of influenza activity, should be considered in the interpretation of results. If clinically indicated, results may be confirmed with PCR testing.  The above 2 analytes were performed by [facility] [phone])  [address] [address]         Narrative:       Influenza A antigen detection tests are unable to distinquish between novel and seasonal influenza A.    A negative result for either Influenza A or B antigen does not exclude influenza virus infection. Clinical correlation required.    All positive influenza antigen tests (A or B) require placement of patient on droplet precaution isolation.    Sterile Body Fluid Culture and Smear [[id number]] Collected:  03/19/17 0945    Specimen:  Amniotic from Pleural Fluid Updated:  03/19/17 1538    Narrative:       Specimen/Source: Amniotic/Pleural Fluid  Collected: 03/19/2017 09:45     Status: Valued      Last Updated: 03/19/2017 15:38                Gram Stain (Final)      No Organisms Seen      Few WBC's Seen      Specimen performed by cytocentrifugation methodology                Imaging:  Ct Angiogram Head    Result Date: 02/25/2017  Normal CTA head. ReadingStation:[id number]    Xr Chest Pa Or Ap    Result Date: 03/19/2017  No complication after right thoracentesis. ReadingStation:[email or web address]    Xr Chest Pa Or Ap    Result Date: 02/21/2017  No pneumothorax status post right-sided thoracentesis. ReadingStation:[email or web address]    Xr Knee 1 Or 2 Views Right    Result Date: 02/26/2017  There is a large joint effusion and linear sclerosis along the lateral aspect of the proximal tibial metaphysis which is questionable for a minimally impacted fracture. Further evaluation with cross-sectional  imaging should be considered when clinically feasible. MRI would be the modality of choice as it would allow for further characterization of the calcifications along the lateral joint line which could be secondary to atherosclerosis or a meniscal tear with a small amount of extruded meniscal tissue. ReadingStation:[email or web address]    Xr Ankle Left Ap And Lateral    Result Date: 03/03/2017  Nonspecific soft tissue swelling, and degenerative changes. Vascular calcification noted. Chronic degenerative changes. Lucency of navicular bone nonspecific but could represent chronic osteomyelitis. If there is clinical concern, MRI would be modality of choice. ReadingStation:[email or web address]    Ct Head Wo Contrast    Result Date: 03/19/2017  Abnormal CT scan of the head demonstrating an area of hypodensity in the left frontal cortical region where a previous hemorrhagic infarction has been described. The area is smaller in size versus the prior study. Serial imaging for this finding is reasonable. There remains a left frontal calcified meningioma, stable. Slight stable subcortical microvascular disease changes. Mild/moderate generalized brain volume loss with mild widening of the cerebellum that may  be compatible with age to excessive. ReadingStation:[email or web address]    Ct Head Wo Contrast    Result Date: 02/24/2017  Subarachnoid hemorrhage in left frontal lobe, with surrounding vasogenic edema, loss of gray-white matter differentiation, mild effacement of left lateral ventricle, and 3 mm left-to-right midline shift. This mostly involves the region of Broca's area, which may explain the patient's difficulty with speech articulation. An underlying mass cannot be excluded. A stat MRI of the brain with contrast is recommended for further characterization. Findings were discussed with Dr. Nunzio Cory in the emergency room at 12:50pm on the 10th of December, 2018. ReadingStation:[id number]    Ct Angiogram Neck    Result Date: 02/25/2017  Abnormal CT angiogram of  the neck demonstrating: 1. On the right the internal carotid is moderate stenosis of approximately 60%. The right vertebral artery is patent. 2. On the left there is mild narrowing the proximal internal carotid artery. The left vertebral artery is patent. ReadingStation:[id number]    Ct Chest W Contrast (trauma)    Result Date: 02/20/2017  IMPRESSION: Contrast CT of the chest showing a large right effusion, which is free-flowing, without evidence of underlying pleural enhancement or nodularity to suggest an exudate by CT. Areas of subsegmental atelectasis as described above, without evidence of endobronchial lesion or mass. Very significant right atrium and right ventricular enlargement with signs of right heart failure. Associated left atrial enhancement could indicate a combined right and left involvement. ReadingStation:[email or web address]    Ct Knee Right Wo Contrast    Result Date: 02/26/2017  1.  Osteoporosis, marked cartilage loss in the lateral compartment, and chronic-appearing trabecular impaction lateral tibial plateau without lucent fracture line or depression. 2.  Large effusion and popliteal cyst. Chondrocalcinosis may be related to underlying hypercalcemia or CPPD.  ReadingStation:[email or web address]    Mri Brain W Wo Contrast    Result Date: 02/24/2017  Abnormal MRI scan of the head demonstrating: 1. There is an acute cortical infarction of the left operculofrontal region with hemorrhagic features without significant mass effect 2. There is a chronic microhemorrhage in the left thalamus 3. Moderate cortical and subcortical atrophy. 4. Mild chronic microvascular ischemic disease. ReadingStation:[id number]    Xr Chest Ap Portable    Result Date: 03/19/2017  Stable right pleural effusion and right basilar atelectasis. Stable cardiomegaly. ReadingStation:[id number]    Xr Chest Ap Portable    Result Date: 03/03/2017  IMPRESSION: Recurrent moderate right pleural effusion with middle and lower lobe atelectasis. Correlate  clinically to exclude pneumonia. ReadingStation:[email or web address]    Xr Chest Ap Portable    Result Date: 02/20/2017  FINDINGS/IMPRESSION: Prominent consolidation of the right middle and right lower lobe, with underlying effusion, possibly partially loculated within the major fissure.. If a CT of the contrast is contemplated, it must be performed with IV contrast to characterize the consolidation and pleural process ReadingStation:[email or web address]    Ct Abdomen Pelvis Wo Iv/ W Po Cont    Result Date: 02/21/2017  1.  There is mild wall thickening along the ascending colon. This is suggestive of colitis. Findings could be infectious, inflammatory or ischemic in etiology. Consider further evaluation with endoscopy following the resolution of acute illness to exclude neoplastic wall thickening. 2.  Heterogeneous right basilar airspace disease is present and there is a small amount of residual right pleural fluid. In the setting of recent thoracentesis, airspace disease is suspected to represent resolving atelectasis. Superimposed consolidation cannot be excluded. 3.  Advanced atherosclerosis. 4.  Fibroid uterus. ReadingStation:[email or web address]    US Thoracentesis    Result Date:  03/19/2017  Successful ultrasound-guided right thoracentesis with removal of 1.2 L of clear right pleural fluid. Fluid sent for the requested laboratory evaluation. ReadingStation:[email or web address]    US Thoracentesis    Result Date: 02/21/2017  Successful ultrasound guided drainage catheter insertion and subsequent large volume thoracentesis. ReadingStation:[email or web address]          Disposition:                                                                       [facility]            Today's date: 03/19/2017  Length of Stay: 0    Signed by: Lorrin Goodell, MD  Pager # [phone]    Total time spent with the patient/family, counseling and/or coordination of care with other physicians, other health care professionals is Time spent:::"35" minutes"   I have addressed the principle problem  (listed above), the active hospital problems (listed above) and the results of laboratory tests.   Greater than 50% of the time was spent counseling and coordinating care.    [employer], [place]  [address]  [place], [id number]

## 2017-03-19 NOTE — H&P (Signed)
Admission Date: 03/19/17 5:47 AM  Patient Name: Kristina [name], WEIGHT  DOB: 1929-07-30, 84 y.o.  MRN: [id number]  Attending Physician: Harriette Bouillon, MD  Primary Care Physician: Ellender Hose, MD        CC:   Chief Complaint   Patient presents with   . Chest Pain       ASSESSMENT                                                                                   [facility]   Principal Problem:    Pleural effusion, right  Active Problems:    PAF (paroxysmal atrial fibrillation)    HTN (hypertension)    Hypothyroidism    Chronic systolic (congestive) heart failure    Cerebrovascular accident (CVA) with intracranial hemorrhage    Pleural effusion due to congestive heart failure      Kristina [name] is a 84 y.o. female who presents to the hospital with shortness of breath.  This a patient with history of paroxysmal atrial fibrillation, hypertension, hypothyroidism  PLAN                                                                                                   [facility]   Pleural effusion, right:Likely t parapneumonic effusion with no mass seen on the CT 02/20/2017  -Thoracentesis and send the fluid for analysis (LDH, Protein, Glucose, Gram Stain, Cell Count)  -ECHO showing both LV failure EF 40% and RV failure. The patient also has severe tricuspid regurgitation  -IV lasix 40mg     Cerebral infarction with hemorrhagic conversion   -Repeat CT to determine anticoagulation  -MRI on 12/13 was acute cortical infarction of the left operculofrontal region with hemorrhagic features without significant mass effect    Right/Left Congestive Heart Failure  -Very significant right atrium and right ventricular enlargement with signs of right heart failure (CT 12/6)  -ECHO 12/6 showing both LV failure EF 40% and RV failure with severe tricuspid regurgitation     ID: R/o infectious cause   -Procalcitonin  -S/p Vanc/Zosyn    PAF (paroxysmal atrial fibrillation)  -continue Cardizem  -No anticoagulation at this  time, due for repeat CT of head and neck to reevaluate (per Dr. Nunzio Cory on previous admission)    HTN (hypertension)  -Continue home medications,     HLD  -Cont atorvasatain    Hypothyroidism  - qd    Rheumatoid Arthritis  -Methotrexate      DVT Prophylaxis:Hx of recent GI Bleed, and hemorrhagic conversion  Diet: Regular    Code Status:DNR  Service status: INPATIENT  Anticipate discharge in:3 days.      History of Presenting Illness:  [facility]     Kristina [name] is a 84 y.o. female  presents via EMS from [facility] for SOB and chaplain explains chest pain. Per EMS, the patient was given 3 nitro there without relief. She refused aspirin at that time. She has a history of chronic a-fib, chronic chest pain, CHF, hypertension, and disorder of thyroid.  Recent admission for CVA. Discontinued eliquis and prior admission for hematochezia.     Past Medical History:                                                                        [facility]     Past Medical History:   Diagnosis Date   . A-fib    . Congestive heart failure    . Disorder of thyroid     hypo   . Hypertension    . Melanoma      Past Surgical History:                                                                       [facility]     Past Surgical History:   Procedure Laterality Date   . arm surgery      roght   . HIP SURGERY      left     Family History:                                                                                   [facility]   History reviewed. No pertinent family history.  Social History:                                                                                    [employer]     Social History     Social History   . Marital status: Widowed     Spouse name: N/A   . Number of children: N/A   . Years of education: N/A     Occupational History   . Not on file.     Social History Main Topics   . Smoking status:  Never Smoker   . Smokeless tobacco: Never Used   . Alcohol use No   . Drug use: No   . Sexual activity: Not on file     Other Topics Concern   .  Not on file     Social History Narrative   . No narrative on file     Code Status: Prior  Allergies:                                                                                            [facility]     Allergies   Allergen Reactions   . Lisinopril Anaphylaxis     Medications:                                                                                       [facility]     No current facility-administered medications on file prior to encounter.      Current Outpatient Prescriptions on File Prior to Encounter   Medication Sig Dispense Refill   . aspirin 81 MG chewable tablet Chew 1 tablet (81 mg total) by mouth daily.     Marland Kitchen atorvastatin (LIPITOR) 80 MG tablet Take 1 tablet (80 mg total) by mouth nightly.     . dilTIAZem (CARDIZEM CD) 240 MG 24 hr capsule Take 240 mg by mouth every morning.         . fluticasone (FLONASE) 50 MCG/ACT nasal spray 1 spray by Nasal route daily as needed for Rhinitis or Allergies.         . folic acid (FOLVITE) 1 MG tablet Take 1 mg by mouth daily.     Marland Kitchen lactase (LACTAID) 3000 units tablet Take 1 tablet by mouth 3 (three) times daily with meals.     . lactobacillus species (BIO-K PLUS) capsule Take 1 capsule (50 Billion CFU total) by mouth daily. 30 capsule 0   . latanoprost (XALATAN) 0.005 % ophthalmic solution Place 1 drop into both eyes nightly.         . levothyroxine (SYNTHROID, LEVOTHROID) 112 MCG tablet Take 112 mcg by mouth every morning.         Marland Kitchen LUMIGAN 0.01 % Solution Place 1 drop into both eyes nightly.     . methotrexate 2.5 MG tablet Take 10 mg by mouth Once a week on Monday evening.         . nitroglycerin (NITROSTAT) 0.4 MG SL tablet Place 0.4 mg under the tongue every 5 (five) minutes as needed for Chest pain.         . predniSONE (DELTASONE) 20 MG tablet Take 1 tablet (20 mg total) by mouth every morning  with breakfast.For 5 days     . PROAIR HFA 108 (90 Base) MCG/ACT inhaler Inhale 2 puffs into the lungs 2 (two) times daily as needed for Wheezing or Shortness of Breath.         Marland Kitchen SIMBRINZA 1-0.2 % Suspension Place 1 drop into both eyes every morning.  Review Of Systems:                                                                          [facility]   Constitutional: Negative for appetite change, diaphoresis and fever.   HENT: Negative for congestion, rhinorrhea, sinus pressure and trouble swallowing.    Eyes: Negative for pain, discharge and itching.   Respiratory: Negative for apnea, cough and shortness of breath.    Cardiovascular: Positive for chest pain. Negative for palpitations and leg swelling.   Gastrointestinal: Negative for abdominal distention, abdominal pain, diarrhea, nausea and vomiting.   Endocrine: Negative.    Genitourinary: Negative for difficulty urinating, dysuria and hematuria.   Musculoskeletal: Negative for arthralgias, back pain, myalgias and neck pain.   Skin: Negative for rash and wound.   Neurological: Negative for dizziness, seizures, syncope, speech difficulty and headaches.   Psychiatric/Behavioral: Negative for agitation, behavioral problems and suicidal ideas.   Physical Exam:                                                                                  [facility] Hospital[facility]     Patient Vitals for the past 24 hrs:   BP Temp Temp src Pulse Resp SpO2   03/19/17 0531 (!) 126/91 - - (!) 104 21 95 %   03/19/17 0331 136/81 - - (!) 113 (!) 27 95 %   03/19/17 0302 106/61 98.9 F (37.2 C) Oral 99 (!) 24 95 %   03/19/17 0232 114/62 - - 96 21 96 %   03/19/17 0105 141/85 (!) 100.9 F (38.3 C) Rectal (!) 125 (!) 26 90 %     There is no height or weight on file to calculate BMI.    Intake/Output Summary (Last 24 hours) at 03/19/17 0547  Last data filed at 03/19/17 0410   Gross per 24 hour   Intake             1050 ml   Output                0 ml   Net             1050 ml        Physical Exam  Constitutional: She appears well-developed and well-nourished. No distress.   HENT:   Head: Normocephalic and atraumatic.   Right Ear: External ear normal.   Left Ear: External ear normal.   Nose: Nose normal.   Mouth/Throat: Oropharynx is clear and moist. No oropharyngeal exudate.   Eyes: Pupils are equal, round, and reactive to light. Conjunctivae and EOM are normal. Right eye exhibits no discharge. Left eye exhibits no discharge. No scleral icterus.   Neck: Normal range of motion. Neck supple.   Cardiovascular: Normal heart sounds and intact distal pulses.  An irregularly irregular rhythm present. Tachycardia present.  Exam reveals no gallop and no friction rub.  No murmur heard.  Pulmonary/Chest: Effort normal and breath sounds normal. She has no wheezes. She has no rales.   Abdominal: Soft. Bowel sounds are normal. She exhibits no distension. There is no tenderness. There is no rebound and no guarding.   Musculoskeletal: Normal range of motion. She exhibits edema (chronic, pitting, BLE. ). She exhibits no tenderness.   Neurological: She has normal reflexes.   A&Ox2   Skin: Skin is warm and dry. No rash noted. She is not diaphoretic. No pallor.   Psychiatric: She has a normal mood and affect. Her behavior is normal.   Nursing note and vitals reviewed.    Labs & Imaging reviewed and interpreted by me                   [facility]     Results     Procedure Component Value Units Date/Time    Troponin I (Stat) [[id number]] Collected:  03/19/17 0414    Specimen:  Plasma Updated:  03/19/17 0508     Troponin I 0.02 ng/mL     Blood Culture - Venipuncture # 1 [[id number]] Collected:  03/19/17 0144    Specimen:  Blood from Venipuncture Updated:  03/19/17 0154    BNP [[id number]]  (Abnormal) Collected:  03/19/17 0112    Specimen:  Blood Updated:  03/19/17 0147     B-Natriuretic Peptide 383.7 (H) pg/mL     Troponin I (Stat) [[id number]] Collected:  03/19/17 0112    Specimen:  Plasma Updated:   03/19/17 0146     Troponin I 0.02 ng/mL     CMP [[id number]]  (Abnormal) Collected:  03/19/17 0112    Specimen:  Plasma Updated:  03/19/17 0144     Sodium 136 mMol/L      Potassium 3.6 mMol/L      Chloride 98 mMol/L      CO2 27.3 mMol/L      Calcium 9.6 mg/dL      Glucose 244 (H) mg/dL      Creatinine 0.10 mg/dL      BUN 14 mg/dL      Protein, Total 6.6 gm/dL      Albumin 3.4 (L) gm/dL      Alkaline Phosphatase 119 U/L      ALT 21 U/L      AST (SGOT) 24 U/L      Bilirubin, Total 0.8 mg/dL      Albumin/Globulin Ratio 1.06 Ratio      Anion Gap 14.3 mMol/L      BUN/Creatinine Ratio 17.3 Ratio      EGFR 66 mL/min/1.36m2      Osmolality Calc 274 (L) mOsm/kg      Globulin 3.2 gm/dL     Influenza A / B Rapid Test [[id number]] Collected:  03/19/17 0114    Specimen:  Nasal Wash Updated:  03/19/17 0141     Influenza A Negative     Influenza B Negative    Narrative:       Influenza A antigen detection tests are unable to distinquish between novel and seasonal influenza A.    A negative result for either Influenza A or B antigen does not exclude influenza virus infection. Clinical correlation required.    All positive influenza antigen tests (A or B) require placement of patient on droplet precaution isolation.    PT/INR [[id number]]  (Abnormal) Collected:  03/19/17 0112    Specimen:  Blood Updated:  03/19/17 0133     PT 11.9 (H) sec  PT INR 1.2    i-Stat Lactic AcID [[id number]] Collected:  03/19/17 0123    Specimen:  Venipuncture Updated:  03/19/17 0126     Room Number, ISTAT 05     Sample, ISTAT Venous     Site, ISTAT OTHER     i-STAT Lactic acid 1.55 mMol/L     CBC [[id number]]  (Abnormal) Collected:  03/19/17 0112    Specimen:  Blood from Blood Updated:  03/19/17 0125     WBC 11.8 (H) K/cmm      RBC 4.13 M/cmm      Hemoglobin 14.6 gm/dL      Hematocrit 78.2 %      MCV 103 (H) fL      MCH 35 pg      MCHC 34 gm/dL      RDW 95.6 %      PLT CT 262 K/cmm      MPV 7.7 fL      NEUTROPHIL % 73.4 %      Lymphocytes 17.3 %       Monocytes 8.5 %      Eosinophils % 0.4 %      Basophils % 0.5 %      Neutrophils Absolute 8.7 (H) K/cmm      Lymphocytes Absolute 2.0 K/cmm      Monocytes Absolute 1.0 K/cmm      Eosinophils Absolute 0.0 K/cmm      BASO Absolute 0.1 K/cmm     I-Stat Lactic Acid [[id number]] Collected:  03/19/17 0120    Specimen:  ISTAT Updated:  03/19/17 0121     I-STAT Notification Istat Notification        EKG Results     Procedure Component Value Units Date/Time    ECG 12 lead (Specify reason for exam) [[id number]] Collected:  03/19/17 0105     Updated:  03/19/17 0115     Patient Age 17 years      Patient DOB 10/20/1929     Patient Height --     Patient Weight --     Interpretation Text --     Atrial fibrillation      Electronically Signed On 03-19-2017 1:15:00 EST by Elwin Mocha       Physician Interpreter [facility]     Ventricular Rate 127 //min      QRS Duration 77 ms      P-R Interval -- ms      Q-T Interval 372 ms      Q-T Interval(Corrected) 541 ms      P Wave Axis -- deg      QRS Axis -27 deg      T Axis 132 years           XR Chest AP Portable   Final Result   Stable right pleural effusion and right basilar atelectasis.   Stable cardiomegaly.                  ReadingStation:[id number]          Signed by: Isac Caddy, MD PGY-1

## 2017-03-19 NOTE — Progress Notes (Signed)
Supervising Physician: Dr. Caryn Section  Radiology Assistant: N/A  MINS Tech: Harvest Dark  Nurse: Mahlon Gammon    Timeout/Procedure Boarding Pass completed.  Patient positioned on side of bed and on monitor.    Right posterior chest site was prepped/draped in sterile fashion, then lidocaine 1% SQ to intended site by Dr. Caryn Section. Then catheter placed into site using Ultrasound guidance and crossed pleura by Dr. Caryn Section.  Draining clear yellow fluid.  Tolerated procedure without problems.     Once drainage completed, catheter removed and occlussive dressing applied to site.  No bleeding or hematoma. Team debriefing was completed.  Labs ordered on specimen and will be sent.  Post care of plan made.     Total fluid removed: 1200 mL    Patient to be taken for head CT and then chest xray .

## 2017-03-19 NOTE — ACP (Advance Care Planning) (Signed)
Advance Care Planning Services    Patient Name: Kristina [name], Kristina [name] LOS: 0 days   Attending Physician: Harriette Bouillon, MD   Primary Care Physician: Ellender Hose, MD   Narrative of Meeting   Events prompting this discussion:  Her advanced age and her repeat admissions to the hospital     Major disease and the trajectory:  Recurrent pleural effusions and her hypertension and history of CVA     The types of care discussed:  Standard of care for disease, Resuscitation with CPR, Intubation/TracheostomyTube and BiPAP and intravenous medications to bring her blood pressure     The Patient's/Surrogate's wishes and goals for treatment:  We went over her DNR form and additionally I went over what she would like to do in the setting that her heart is functioning.  We went over each of the 3 modalities including BiPAP, intubation, and intravenous pressors to determine whether or not the patient would desire these and she said no.  She seems to be making appropriate decisions and knows that the consequences of not administering these may be death and she was able to voice status back to me.     ALLOW NATURAL DEATH   Active Problem List  Principal Problem:    Pleural effusion, right  Active Problems:    PAF (paroxysmal atrial fibrillation)    HTN (hypertension)    Hypothyroidism    Chronic systolic (congestive) heart failure    Cerebrovascular accident (CVA) with intracranial hemorrhage    Pleural effusion due to congestive heart failure        Documents Filled Out As Part of Discussion:                EMR orders  Existing Documents that were reviewed and discussed with the patient/surrogate:  DNR form   Acknowledgment of patient's right to decline advance care planning:  The patient has medical decision-making capacity and was represented by herself.  The patient/surrogate was informed that this session is completely voluntary and was given the opportunity to decline the ACP services. The patient/surrogate decided to  participate in the ACP session with the discussion proceeding as described above.   I have spent 18 minutes on face-to-face Advance Care Planning Services   100% of the time was spent on discussion and counseling the patient.   No active management of the problems listed above was undertaken during the time period reported.       Medical decision-making capacity can be determined by patient's ability to have:  1.     Factual understanding of current situation and issues;   2.     Interpretation of the information presented.   3.     Reasoning ability about decisions/choices.  4.     Execution of voluntary decision without coercion.   Advance care planning:             Improves end of life care            Improves patient & family satisfaction            Reduces stress, anxiety, & depression in surviving relatives  Reference:  Detering et al., BMJ 2010; 340 doi: PopPath.it (Published 08 June 2008)   Genevieve Norlander, MD  03/19/17 6:38 AM  MRN: [id number]  CSN: [id number]                                             DOB: 07-21-1929

## 2017-03-19 NOTE — Plan of Care (Signed)
Problem: Compromised Hemodynamic Status  Goal: Vital signs and fluid balance maintained/improved  Outcome: Progressing   03/19/17 1827   Goal/Interventions addressed this shift   Vital signs and fluid balance are maintained/improved Monitor/assess vitals and hemodynamic parameters with position changes;Monitor and compare daily weight;Monitor intake and output. Notify LIP if urine output is less than 30 mL/hour.;Monitor/assess lab values and report abnormal values       Problem: Inadequate Gas Exchange  Goal: Adequate oxygenation and improved ventilation  Outcome: Progressing   03/19/17 1827   Goal/Interventions addressed this shift   Adequate oxygenation and improved ventilation Assess lung sounds;Monitor SpO2 and treat as needed;Provide mechanical and oxygen support to facilitate gas exchange;Increase activity as tolerated/progressive mobility       Problem: Compromised Tissue integrity  Goal: Damaged tissue is healing and protected  Outcome: Progressing   03/19/17 1827   Goal/Interventions addressed this shift   Damaged tissue is healing and protected  Monitor/assess Braden scale every shift;Reposition patient every 2 hours and as needed unless able to reposition self;Increase activity as tolerated/progressive mobility;Relieve pressure to bony prominences for patients at moderate and high risk;Avoid shearing injuries;Keep intact skin clean and dry;Use bath wipes, not soap and water, for daily bathing;Encourage use of lotion/moisturizer on skin;Monitor patient's hygiene practices

## 2017-03-20 ENCOUNTER — Ambulatory Visit
Admission: RE | Admit: 2017-03-20 | Discharge: 2017-03-20 | Disposition: A | Discharge status: Home / Self Care | Payer: Medicare Other | Source: Ambulatory Visit | Attending: Physician Assistant | Admitting: Physician Assistant

## 2017-03-20 LAB — VH URINALYSIS WITH MICROSCOPIC
Bilirubin, UA: NEGATIVE
Blood, UA: NEGATIVE
Glucose, UA: NEGATIVE mg/dL
Ketones UA: NEGATIVE mg/dL
Leukocyte Esterase, UA: 25 Leu/uL — AB
Nitrite, UA: NEGATIVE
Protein, UR: NEGATIVE mg/dL
RBC, UA: 1 /hpf (ref 0–5)
Squam Epithel, UA: 1 /hpf (ref 0–2)
Urine Specific Gravity: 1.006 (ref 1.001–1.040)
Urobilinogen, UA: NORMAL mg/dL
WBC, UA: 2 /hpf (ref 0–4)
pH, Urine: 7 pH (ref 5.0–8.0)

## 2017-03-20 LAB — CBC AND DIFFERENTIAL
Basophils %: 0.1 % (ref 0.0–3.0)
Basophils Absolute: 0 10*3/uL (ref 0.0–0.3)
Eosinophils %: 0.1 % (ref 0.0–7.0)
Eosinophils Absolute: 0 10*3/uL (ref 0.0–0.8)
Hematocrit: 41.9 % (ref 36.0–48.0)
Hemoglobin: 14.2 gm/dL (ref 12.0–16.0)
Lymphocytes Absolute: 1.6 10*3/uL (ref 0.6–5.1)
Lymphocytes: 13 % — ABNORMAL LOW (ref 15.0–46.0)
MCH: 35 pg (ref 28–35)
MCHC: 34 gm/dL (ref 32–36)
MCV: 103 fL — ABNORMAL HIGH (ref 80–100)
MPV: 8 fL (ref 6.0–10.0)
Monocytes Absolute: 0.6 10*3/uL (ref 0.1–1.7)
Monocytes: 5.2 % (ref 3.0–15.0)
Neutrophils %: 81.7 % — ABNORMAL HIGH (ref 42.0–78.0)
Neutrophils Absolute: 9.9 10*3/uL — ABNORMAL HIGH (ref 1.7–8.6)
PLT CT: 227 10*3/uL (ref 130–440)
RBC: 4.07 10*6/uL (ref 3.80–5.00)
RDW: 13.9 % (ref 11.0–14.0)
WBC: 12.1 10*3/uL — ABNORMAL HIGH (ref 4.0–11.0)

## 2017-03-20 LAB — BASIC METABOLIC PANEL
Anion Gap: 15 mMol/L (ref 7.0–18.0)
BUN / Creatinine Ratio: 16.2 Ratio (ref 10.0–30.0)
BUN: 11 mg/dL (ref 7–22)
CO2: 23.5 mMol/L (ref 20.0–30.0)
Calcium: 8.7 mg/dL (ref 8.5–10.5)
Chloride: 97 mMol/L — ABNORMAL LOW (ref 98–110)
Creatinine: 0.68 mg/dL (ref 0.60–1.20)
EGFR: 80 mL/min/{1.73_m2} (ref 60–150)
Glucose: 110 mg/dL — ABNORMAL HIGH (ref 71–99)
Osmolality Calc: 266 mOsm/kg — ABNORMAL LOW (ref 275–300)
Potassium: 2.5 mMol/L — CL (ref 3.5–5.3)
Sodium: 133 mMol/L — ABNORMAL LOW (ref 136–147)

## 2017-03-20 LAB — POTASSIUM: Potassium: 3.4 mMol/L — ABNORMAL LOW (ref 3.5–5.3)

## 2017-03-20 MED ORDER — PIPERACILLIN-TAZOBACTAM IN DEX 2-0.25 GM/50ML IV SOLN
2.25 g | Freq: Four times a day (QID) | INTRAVENOUS | Status: DC
Start: 2017-03-20 — End: 2017-03-23
  Administered 2017-03-20 – 2017-03-23 (×12): 2.25 g via INTRAVENOUS
  Filled 2017-03-20 (×13): qty 50

## 2017-03-20 MED ORDER — VH POTASSIUM CHLORIDE CRYS ER 20 MEQ PO TBCR (WRAP)
40.00 meq | EXTENDED_RELEASE_TABLET | Freq: Two times a day (BID) | ORAL | Status: AC
Start: 2017-03-20 — End: 2017-03-20
  Administered 2017-03-20 (×2): 40 meq via ORAL
  Filled 2017-03-20 (×4): qty 2

## 2017-03-20 NOTE — SLP Progress Note (Signed)
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 Pt will tolerate a REGULAR diet with THIN liquids free of signs/symptoms of aspiration. (MET)  2. Pt/family/staff education (MET)    LTG: (By discharge)  1. Pt will tolerate the least restrictive diet free of signs/symptoms of aspiration.  (MET)    SUBJECTIVE:      Subjective: Patient is agreeable to participation in the therapy session. Family and/or guardian are agreeable to patient's participation in the therapy session. Nursing clears patient for therapy. Patient's medical condition is appropriate for Speech therapy intervention at this time.    S:    "I'll have more of that."  (re: Shepherd's pie)      OBJECTIVE:     O: SLP Treatment:  Pt was seen for SLP tx with progress as follows:   Pt positioned upright in bed with regular diet tray arriving as SLP entered room.  Pt seen for diet tolerance assessment.  Regular solid x4 tsp and thin liquids via straw x6 +x4 with pills and water completed free of overt signs/symptoms of aspiration; timely initiation of the swallow reflex; clear oral cavity post swallow; clear voice post swallow though voice continues to be hypophonic.  Recommend continuation of current diet; meds per pt preference.  Continue with SLP tx at next level of care for speech/language skills.      Pt/Family/Caregiver Education Provided:      Patient, family was/were educated re: tx goals, d/c recommendations, progress on goals  Good understanding was verbalized/demonstrated: yes  Comprehension limited by: None    Team Communication:     Spoke to : RN/LPN Luisa Hart  Regarding: tx goals, d/c recommendations, progress on goals  Good understanding was verbalized: yes                          Time of treatment:   SLP Received On: 03/20/17  Start Time: 1723  Stop Time: 1736  Time Calculation (min): 13 min    SLP Visit Number: 2      Completed by:  Harrold Donath, M.S., CCC-SLP  Pager [phone]  Speech-Language Pathologist  [facility]

## 2017-03-20 NOTE — UM Notes (Signed)
[facility] Utilization Management Review Sheet    Facility :  [facility]    NAME: Kristina [name]  MR#: [id number]    CSN#: [id number]     DOB: May 09, 1929     ROOM: 556/556-A AGE: 84 y.o.    ADMIT DATE AND TIME: 03/19/2017  1:03 AM      PATIENT CLASS: Inpatient 03/19/2017 @ 0545     ATTENDING PHYSICIAN: Lorrin Goodell, MD  PAYOR:Payor: MEDICARE / Plan: MEDICARE PART A AND B / Product Type: Medicare /       AUTH #:     DIAGNOSIS:     ICD-10-CM    1. Pneumonia due to infectious organism, unspecified laterality, unspecified part of lung J18.9        HISTORY:   Past Medical History:   Diagnosis Date   . A-fib    . Aphasia    . Bradycardia    . Cerebral infarction    . Chest pain    . Congestive heart failure    . Diarrhea    . Disorder of thyroid     hypo   . Hyperlipidemia    . Hypertension    . Melanoma    . Rheumatoid arthritis    . Vitamin D deficiency      ED TREATMENT: Tylenol 1000mg  PO Zosyn 3.375g IV NS IV Vancocin 1000mg  IV     "Salote R Mutschler is a 84 y.o. female  presents via EMS from [facility] for SOB and chaplain explains chest pain. Per EMS, the patient was given 3 nitro there without relief. She refused aspirin at that time. She has a history of chronic a-fib, chronic chest pain, CHF, hypertension, and disorder of thyroid.  Recent admission for CVA. Discontinued eliquis and prior admission for hematochezia." per Dr. Guillermina [place] [facility]&P    LABS: WBC 11.8 Glucose 128 Osmolality 274 Albumin 3.4 BNP 383.7 PT 11.9 Blood Culture: No Growth Body Fluid culture: No growth Negative Flu A&B     XR Chest AP Portable:  Stable right pleural effusion and right basilar atelectasis.  Stable cardiomegaly.    CT Head WO Contrast: Abnormal CT scan of the head demonstrating an area of hypodensity in the left frontal cortical region where a previous hemorrhagic infarction has been described. The area is smaller in size versus the prior study. Serial imaging for this finding is   reasonable. There remains a  left frontal calcified meningioma, stable. Slight stable subcortical microvascular disease changes. Mild/moderate generalized brain volume loss with mild widening of the cerebellum that may be compatible with age to excessive.     XR Chest PA Or AP:  No complication after right thoracentesis.    US Thoracentesis:  Successful ultrasound-guided right thoracentesis with removal of 1.2 L of clear right pleural fluid. Fluid sent for the requested laboratory evaluation.    VITALS: T100.9 Rectal P125 R26 bp141/85 Sat 90% placed on 2L O2    ASSESSMENT AND PLAN:   Pleural effusion, right:Likely t parapneumonic effusion with no mass seen on the CT 02/20/2017  -Thoracentesis and send the fluid for analysis (LDH, Protein, Glucose, Gram Stain, Cell Count)  -ECHO showing both LV failure EF 40% and RV failure. The patient also has severe tricuspid regurgitation  -IV lasix 40mg     Cerebral infarction with hemorrhagic conversion   -Repeat CT to determine anticoagulation  -MRI on 12/13 was acute cortical infarction of the left operculofrontal region with hemorrhagic features without significant mass effect    Right/Left  Congestive Heart Failure  -Very significant right atrium and right ventricular enlargement with signs of right heart failure (CT 12/6)  -ECHO 12/6 showing both LV failure EF 40% and RV failure with severe tricuspid regurgitation     ID: R/o infectious cause   -Procalcitonin  -S/p Vanc/Zosyn    PAF (paroxysmal atrial fibrillation)  -continue Cardizem  -No anticoagulation at this time, due for repeat CT of head and neck to reevaluate (per Dr. Nunzio Cory on previous admission)    HTN (hypertension)  -Continue home medications,     HLD  -Cont atorvasatain    Hypothyroidism  - qd    Rheumatoid Arthritis  -Methotrexate      DVT Prophylaxis:Hx of recent GI Bleed, and hemorrhagic conversion  Diet: Regular    Code Status:DNR  Service status: INPATIENT  Anticipate discharge in:3 days.    PROGRESS NOTE:  Pleural  effusion, right:Likely t parapneumonic effusion with no mass seen on the CT 02/20/2017  -s/p Thoracentesis ,f/u with fluid (LDH, Protein, Glucose, Gram Stain, Cell Count)  -ECHO showing both LV failure EF 40% and RV failure. The patient also has severe tricuspid regurgitation  -IV lasix 40mg     Cerebral infarction with hemorrhagic conversion   -Repeat CT to determine anticoagulation  -MRI on 12/13 wasacute cortical infarction of the left operculofrontal region with hemorrhagic features without significant mass effect    Right/Left Congestive Heart Failure  -Very significant right atrium and right ventricular enlargement with signs of right heart failure(CT 12/6)  -ECHO 12/6showing both LV failure EF 40% and RV failure withsevere tricuspid regurgitation   continue lasix    ID: R/o infectious cause   -Procalcitonin  -S/p Vanc/Zosyn    PAF (paroxysmal atrial fibrillation)  -continue Cardizem  -No anticoagulation at this time, due for repeat CT of head and neck to reevaluate (per Dr. Nunzio Cory on previous admission)    HTN (hypertension)  -Continue home medications,     HLD  -Cont atorvasatain    Hypothyroidism  - qd    Rheumatoid Arthritis  -Methotrexate    DVT Prophylaxis:Hx of recent GI Bleed, and hemorrhagic conversion  Diet: Regular    Code Status:DNR  PT eval.    Admit to [facility] on telemetry vitals q 8 hrs up with assistance only PT eval and treat fall precautions encourage fluids bedside swallow eval and treat O2 to maintain Sat 92% or greater Diet Gluten restricted    MEDICATIONS: Lipitor 80mg  PO @ HS Cardizem 240mg  PO q morning Lasix 40mg  PO Daily Xalatan 1 drop @ HS both eyes Levothyroxine PO q morning Lidocaine 10ml x 1 Zosyn 3.375g q 8 hrs IV     Eames Dibiasio L. [facility] RN BSN  [facility]  Utilization Review  [address]  [place] [address]  [phone]  Fax [phone]

## 2017-03-20 NOTE — PT Eval Note (Signed)
[facility]: [facility]  Department of Rehabilitation Services: [phone]  Kristina [name]    CSN: [id number]    [facility] PULMONARY RENAL UNIT   556/556-A    Physical Therapy Evaluation    Time of treatment:  Time Calculation  PT Received On: 03/20/17  Start Time: 0910  Stop Time: 0950  Time Calculation (min): 40 min    Visit#: 1                                                                                 Precautions and Contraindications:   Falls  Mobility protocol     Clinical Presentation and Decision Making     PT Assessment:  Kristina [name] is a 84 year old female that was admitted 03/19/2017 with shortness of breath due to right pleural effusion. 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     Patient was reserved throughout the PT session and required extended time, encouragement, and explanation to participate in PT assessment. She required maximal assist x 2 to stand up from lower bed surface, otherwise was assist x 1 for bed mobility and stand pivot transfers between commode and bed. Patient needed assistance with cleanup once done using bedside commode.     PT recommends SNF as follow up to  improve activity tolerance, functional mobility, reduce fall risk, and return home safely      Patient presenting with the following PT Impairments:decreased ROM, decreased strength, decreased activity tolerance, decreased functional mobility, decreased balance, pain    Patient will benefit from skilled PT services in order to  improve activity tolerance, functional mobility, reduce fall risk, and return to baseline     Due to the presence of a limited number of treatment options and 1-2 comorbidities or personal factors that affect performance, as well as patient's stable and/or uncomplicated characteristics, modifications were NOT  necessary to complete evaluation when examining total of 3 elements (includes body structures and functions, activity limitations and/or participation restriction) determines the degree of complexity for this patient is LOW    Rehabilitation Potential:Good    Discussed risk, benefits and Plan of Care with: Patient    DISCHARGE RECOMMENDATIONS   DME recommended for Discharge:   TBD at next level of care  Front wheeled walker    Discharge Recommendations:   SNF   Pending medical progress  Pending progress in next therapy session    Development of Plan of Care:     Goals:    By D/C  1. Patient will be modified independent with bed mobility to improve functional mobility and activity tolerance. NEW  2. Patient will be modified independent with chair/bed transfer using least restrictive assistive device to improve functional mobility and activity tolerance. NEW  3. Patient will ambulate 200 ft modified independent  assistance using least restrictive assistive device to improve functional mobility and activity tolerance. NEW    -goals will be updated as patient progresses with therapy      Treatment/interventions: Exercise, Functional transfer training, LE strengthening/ROM, Cognitive reorientation, Patient/caregiver training, Equipment eval/education,  Bed mobility, Compensatory technique education    Treatment Frequency: 4-5x/wk    History:   History of Present Illness:    Medical Diagnosis: Pneumonia due to infectious organism, unspecified laterality, unspecified part of lung [J18.9]    Kristina [name] is a 84 y.o. female admitted on 03/19/2017 with right pleural effusion from CHF    Patient Active Problem List   Diagnosis   . Hematochezia   . PAF (paroxysmal atrial fibrillation)   . HTN (hypertension)   . Hypokalemia   . Pleural effusion, right   . Hypothyroidism   . Chronic systolic (congestive) heart failure   . Cerebrovascular accident (CVA) with intracranial hemorrhage   . Acute CVA (cerebrovascular accident)   .  Pleural effusion due to congestive heart failure        X-Rays/Tests/Labs:  Xr Chest Pa Or Ap    Result Date: 03/19/2017  No complication after right thoracentesis. ReadingStation:[id number]    Ct Head Wo Contrast    Result Date: 03/19/2017  Abnormal CT scan of the head demonstrating an area of hypodensity in the left frontal cortical region where a previous hemorrhagic infarction has been described. The area is smaller in size versus the prior study. Serial imaging for this finding is reasonable. There remains a left frontal calcified meningioma, stable. Slight stable subcortical microvascular disease changes. Mild/moderate generalized brain volume loss with mild widening of the cerebellum that may be compatible with age to excessive. ReadingStation:[email or web address]    Xr Chest Ap Portable    Result Date: 03/19/2017  Stable right pleural effusion and right basilar atelectasis. Stable cardiomegaly. ReadingStation:[id number]    US Thoracentesis    Result Date: 03/19/2017  Successful ultrasound-guided right thoracentesis with removal of 1.2 L of clear right pleural fluid. Fluid sent for the requested laboratory evaluation. ReadingStation:[email or web address]        Past Medical/Surgical History:  Past Medical History:   Diagnosis Date   . A-fib    . Aphasia    . Bradycardia    . Cerebral infarction    . Chest pain    . Congestive heart failure    . Diarrhea    . Disorder of thyroid     hypo   . Hyperlipidemia    . Hypertension    . Melanoma    . Rheumatoid arthritis    . Vitamin D deficiency       Past Surgical History:   Procedure Laterality Date   . arm surgery      roght   . HIP SURGERY      left         Social History:    Home Living Arrangements:  Living Arrangements: Children  Assistance Available: Part time  Type of Home: House  Home Layout: Two level, with no stairs to enter, Able to live on main level with bedroom and bathroom    Prior Level of Function:  Community ambulation  Mobility:  Independent with  No assistive  device  Additional comments: doesn't drive; ambulates outside of home with daughter only  Fall history: none    DME available at home:  none according to patient    Subjective   She mainly responded in 1-2 words statements unless asked with open ended questions, which she will still keep the statement short   Patient is agreeable to participation in the therapy session. Nursing clears patient for therapy.    Patient/caregiver goal for PT: to get home    Pain:  At Rest: 0/10  With Activity: 0/10  Location: N/A  Interventions: None required    Examination of Body Systems (Structures, Function, Activity and Participation)   Patient's medical condition is appropriate for Physical therapy intervention at this time    Observation of patient:  Patient is in bed with Bed/chair alarm on, oxygen at 3 L/min, telemetry    Cognition:  Oriented to: Person, Place  and Situation  Command following: Follows 1 step commands with increased time, Follows 1 step commands with repetition  Alertness/Arousal: Appropriate responses to stimuli   Attention Span:Attends to task with redirection    Vital Signs (Cardiovascular):  BP Supine:  127/80 mmHg  BP Sitting: 131/70 mmHg  HR Sitting:  71 bpm  SpO2 at rest: 93% on 3 L/min    Edema: none  Skin Inspection: intact  Sensation: intact , to light touch     Balance:  Static Sitting:  Fair  Static Standing:  Fair- with walker  Dynamic Standing:  Poor+ with walker            Musculoskeletal Examination:            Range of motion:  Bilateral Shoulder Flexion:  Limited by 75%  Bilateral Shoulder Abduction:  Limited by 75%  Bilateral Elbow Flexion:  WFL  Bilateral Elbow Extension:  WFL  Right LE: Grossly WFL  Left LE: Grossly WFL       Strength:  Right UE: Grossly 3/5 except for shoulder flexion and abduction (2/5)  Left UE: Grossly 3/5 except for shoulder flexion and abduction (2/5)  Right LE: Grossly 3+/5  Left LE: Grossly 3+/5    Tone:         Functional Mobility:    Bed Mobility:  Supine to Sit:    Moderate assist.   Cues for Sequencing., Cues for Hand placement., HOB flat, Bed rail used, assist at trunk  Sit to Supine:   Moderate assist.   Cues for Sequencing., Cues for Hand placement., HOB flat, Bed rail used, assist at trunk, assist at LE(s)    Transfers:  Sit to Stand:  Maximal assist (assist x2 for safety) with Front wheeled walker.    Cues for Sequencing, Cues for Hand Placement, Cues for Walker Management  Stand to Sit:  Minimal assist (assist x2 for safety).    Cues for Sequencing, Cues for Hand Placement, Cues for Walker Management  Stand Pivot:   Moderate assist with Front wheeled walker.   Cues for Sequencing, Cues for Hand Placement, Cues for Walker Management  Comments: needed increased assistance from a lower sitting surface and no rails; needed only minimal assistance x 2 to stand up from bedside commode due to arm rests and elevated seat.     Locomotion:  not performed during assessment due to patient's reservation and limited participation in PT evaluation.                       Participation and Activity Tolerance:  Participation effort: Excellent  Activity Tolerance: Tolerates 30 minutes of activity with rest breaks    Treatment Interventions this session:   Evaluation  Therapeutic activity    Education Provided:   TOPICS: role of physical therapy, plan of care, goals of therapy and safety with mobility and ADLs, benefits of activity, energy conservation techniques     Learner educated: Patient  Method: Explanation and Demonstration  Response to education: Needs reinforcement    Patient Position at End of Treatment:   Supine, in bed, in the  room, Needs in reach, Bed/chair alarm set and No distress    Team Communication:     Spoke to: RN/LPN - Luisa Hart  Regarding: Pre-session re: patient status, Patient position at end of session, Patient participation with Therapy, Further recommendations  Whiteboard updated: Yes  PT/PTA communication: via written note and verbal communication as  needed.      Recommend patient stand pivot onto bedside commode with walker and assist x 2 outside of PT sessions. Sit up in bed for all meals.     Tito Dine, PT, DPT

## 2017-03-20 NOTE — Progress Notes (Signed)
PROGRESS NOTE - [facility]    Date Time: 03/20/17 4:31 PM  Patient Name: Kristina [name]  Attending Physician: Lorrin Goodell, MD    Assessment and Plan                                                       [facility]     Principal Problem:    Pleural effusion, right  Active Problems:    PAF (paroxysmal atrial fibrillation)    HTN (hypertension)    Hypothyroidism    Chronic systolic (congestive) heart failure    Cerebrovascular accident (CVA) with intracranial hemorrhage    Pleural effusion due to congestive heart failure  Resolved Problems:    * No resolved hospital problems. *    Pleural effusion, right:Likely t parapneumonic effusion with no mass seen on the CT 02/20/2017  -s/p Thoracentesis ,f/u with fluid Ldh seems high,?exudate  -ECHO showing both LV failure EF 40% and RV failure. The patient also has severe tricuspid regurgitation  -IV lasix 40mg   blood cx-no growth  Pleural fluid cx-no growth so far  Continue broad-spectrum antibiotics, on Zosyn and vancomycin    Cerebral infarction with hemorrhagic conversion   -Repeat CT shows mild hypodensity Left frontal cortical region where a  prior hemorrhage was present, a left frontal extra-axial calcified nodule measuring 7 mm, presumed small calcified meningioma.  -MRI on 12/13 was acute cortical infarction of the left operculofrontal region with hemorrhagic features without significant mass effect    Right/Left Congestive Heart Failure  -Very significant right atrium and right ventricular enlargement with signs of right heart failure (CT 12/6)  -ECHO 12/6 showing both LV failure EF 40% and RV failure with severe tricuspid regurgitation   continue lasix    ID: [name]/o infectious cause   -Procalcitonin  -S/p Vanc/Zosyn    PAF (paroxysmal atrial fibrillation)  -continue Cardizem  -No anticoagulation at this time, due for repeat CT of head and neck to reevaluate (per Dr. Nunzio Cory on previous admission)    HTN (hypertension)  -Continue home  medications,     HLD  -Cont atorvasatain    Hypothyroidism  - qd    Rheumatoid Arthritis  -Methotrexate    DVT Prophylaxis:Hx of recent GI Bleed, and hemorrhagic conversion  Diet: Regular    Code Status:DNR  PT eval.    Would d/w family  Subjective                                                                          [facility]        stable,no acute complaints,feels weak    Review Of Systems:                                                                          [place]  [facility]   negative    Physical Exam:                                                                    [facility]     Temp:  [98.1 F (36.7 C)-99.9 F (37.7 C)] 98.8 F (37.1 C)  Heart Rate:  [95-128] 95  Resp Rate:  [14-18] 16  BP: (107-150)/(64-88) 118/79    Intake/Output Summary (Last 24 hours) at 03/20/17 1631  Last data filed at 03/20/17 1300   Gross per 24 hour   Intake              120 ml   Output                0 ml   Net              120 ml       General: A/A/O, ; no acute distress,fatigued,gen.weakness  HEENT: perrla, eomi, sclera anicteric  oropharynx clear without lesions, mucous membranes moist  Glands: No cervical or axillary lymphadenopathy.  Neck: supple, no lymphadenopathy, no thyromegaly, no JVD, no carotid bruits  Cardiovascular: regular rate and rhythm, no rubs or gallops  Lungs: clear to auscultation bilaterally, without wheezing, rhonchi, or rales  Abdomen: soft, non-tender, non-distended; no palpable masses, no hepatosplenomegaly, normoactive bowel sounds, no rebound or guarding  Extremities: no clubbing, cyanosis, or edema  Neuro: cranial nerves grossly intact, strength 2-3/5 in upper and lower extremities, sensation intact,   Psych: Normal affect, not depressed   Skin: no rashes or lesions noted      Meds:                                                                                  [facility]       Current Facility-Administered Medications   Medication Dose Route  Frequency Provider Last Rate Last Dose   . acetaminophen (TYLENOL) tablet 650 mg  650 mg Oral Q4H PRN Hwig, Nauras, MD        Or   . acetaminophen (TYLENOL) 160 MG/5ML oral solution 650 mg  650 mg per NG tube Q4H PRN Hwig, Nauras, MD        Or   . acetaminophen (TYLENOL) suppository 650 mg  650 mg Rectal Q4H PRN Hwig, Nauras, MD       . albuterol (PROVENTIL HFA;VENTOLIN HFA) inhaler 2 puff  2 puff Inhalation BID PRN Hwig, Nauras, MD       . aspirin chewable tablet 81 mg  81 mg Oral Daily Hwig, Nauras, MD   81 mg at 03/20/17 1126   . atorvastatin (LIPITOR) tablet 80 mg  80 mg Oral QHS Hwig, Nauras, MD   80 mg at 03/19/17 2004   . dilTIAZem (CARDIZEM CD) 24 hr capsule 240 mg  240 mg Oral QAM Hwig, Nauras, MD   240 mg at 03/20/17 0805   . docusate sodium (COLACE) capsule 100  mg  100 mg Oral Daily Hwig, Nauras, MD   100 mg at 03/20/17 1126   . folic acid (FOLVITE) tablet 1 mg  1 mg Oral Daily Hwig, Nauras, MD   1 mg at 03/20/17 1126   . furosemide (LASIX) injection 40 mg  40 mg Intravenous Once Hwig, Nauras, MD       . furosemide (LASIX) tablet 40 mg  40 mg Oral Daily Lorrin Goodell, MD   40 mg at 03/20/17 1126   . ibuprofen (ADVIL,MOTRIN) tablet 600 mg  600 mg Oral Q6H PRN Hwig, Nauras, MD       . lactobacillus species (BIO-K PLUS) capsule 50 Billion CFU  50 Billion CFU Oral Daily Hwig, Nauras, MD   50 Billion CFU at 03/20/17 1126   . latanoprost (XALATAN) 0.005 % ophthalmic solution 1 drop  1 drop Both Eyes QHS Hwig, Nauras, MD   1 drop at 03/19/17 2004   . levothyroxine (SYNTHROID, LEVOTHROID) tablet 112 mcg  112 mcg Oral QAM Hwig, Nauras, MD   112 mcg at 03/20/17 0805   . [START ON 03/24/2017] methotrexate tablet 10 mg  10 mg Oral Q MONDAY (PM) Hwig, Nauras, MD       . naloxone (NARCAN) injection 0.4 mg  0.4 mg Intravenous PRN Hwig, Nauras, MD       . nitroglycerin (NITROSTAT) SL tablet 0.4 mg  0.4 mg Sublingual Q5 Min PRN Hwig, Nauras, MD       . piperacillin-tazobactam (ZOSYN) 2.25 g IVPB 50 mL (premix) 2.25 g  2.25  g Intravenous Q6H Tredarius Cobern, MD       . potassium chloride (K-DUR,KLOR-CON) CR tablet 40 mEq  40 mEq Oral BID Lorrin Goodell, MD   40 mEq at 03/20/17 1126   . senna-docusate (PERICOLACE) 8.6-50 MG per tablet 2 tablet  2 tablet Oral QHS Hwig, Nauras, MD       . sodium chloride (PF) 0.9 % injection 3 mL  3 mL Intravenous Q8H Hwig, Nauras, MD   3 mL at 03/20/17 1354   . vancomycin (VANCOCIN) 750 mg in sodium chloride 0.9 % 250 mL IVPB (vial-mate)  750 mg Intravenous Q24H Genevieve Norlander, MD 200 mL/hr at 03/20/17 0429 750 mg at 03/20/17 0429   . vancomycin therapy placeholder   Does not apply See Admin Instructions Lorrin Goodell, MD           Labs and Imaging:                                                              [facility]       RECENT LABS (from the last 7 days)    Recent Labs  Lab 03/20/17  0813 03/19/17  0112   WBC 12.1* 11.8*   RBC 4.07 4.13   Hemoglobin 14.2 14.6   Hematocrit 41.9 42.4   MCV 103* 103*   PLT CT 227 262       Recent Labs  Lab 03/19/17  0112   PT 11.9*   PT INR 1.2       Recent Labs  Lab 03/19/17  0414 03/19/17  0112   Troponin I 0.02 0.02      Recent Labs  Lab 03/20/17  0813 03/19/17  0112   Glucose 110* 128*  Sodium 133* 136   Potassium 2.5* 3.6   Chloride 97* 98   CO2 23.5 27.3   BUN 11 14   Creatinine 0.68 0.81   EGFR 80 66   Calcium 8.7 9.6           Recent Labs  Lab 03/19/17  0414 03/19/17  0112   Albumin  --  3.4*   Protein, Total 5.4* 6.6   Bilirubin, Total  --  0.8   Alkaline Phosphatase  --  119   ALT  --  21   AST (SGOT)  --  24      Recent Labs  Lab 03/20/17  0233   Specific Gravity, UR 1.006   pH, Urine 7.0   Protein, UR Negative   Glucose, UA Negative   Ketones UA Negative   Bilirubin, UA Negative   Blood, UA Negative   Nitrite, UA Negative   Urobilinogen, UA Normal   Leukocyte Esterase, UA 25*   WBC, UA 2   RBC, UA 1   Bacteria, UA Rare*      Lab Results   Component Value Date    HGBA1CPERCNT 5.7 02/24/2017            Microbiology, reviewed and are significant  for:  Microbiology Results     Procedure Component Value Units Date/Time    Blood Culture - Venipuncture # 1 [[id number]] Collected:  03/19/17 0144    Specimen:  Blood from Venipuncture Updated:  03/19/17 0617    Narrative:       Specimen/Source: Blood/Venipuncture  Collected: 03/19/2017 01:44     Status: Valued      Last Updated: 03/19/2017 06:15                Culture Result (Prelim)      Culture In Progress          Influenza A / B Rapid Test [[id number]] Collected:  03/19/17 0114    Specimen:  Nasal Wash Updated:  03/19/17 0141     Influenza A Negative     Influenza B Negative     Comment: Method: Jarvis Morgan    The sensitivity for this method is between 90% and 95% for Influenza A and around 90% for Influenza B. The specificity for both Influenza A and B is around 96%. False positive results may occur, especially when the prevalence of Influenza activity is low. This is more likely with Influenza B due to its lower prevalence. Clinical conditions, including the prevalence of influenza activity, should be considered in the interpretation of results. If clinically indicated, results may be confirmed with PCR testing.  The above 2 analytes were performed by [name] [phone])  [address] [address]         Narrative:       Influenza A antigen detection tests are unable to distinquish between novel and seasonal influenza A.    A negative result for either Influenza A or B antigen does not exclude influenza virus infection. Clinical correlation required.    All positive influenza antigen tests (A or B) require placement of patient on droplet precaution isolation.    Sterile Body Fluid Culture and Smear [[id number]] Collected:  03/19/17 0945    Specimen:  Amniotic from Pleural Fluid Updated:  03/19/17 1538    Narrative:       Specimen/Source: Amniotic/Pleural Fluid  Collected: 03/19/2017 09:45     Status: Valued      Last Updated: 03/19/2017 15:38  Gram Stain  (Final)      No Organisms Seen      Few WBC's Seen      Specimen performed by cytocentrifugation methodology                Imaging:  Ct Angiogram Head    Result Date: 02/25/2017  Normal CTA head. ReadingStation:[id number]    Xr Chest Pa Or Ap    Result Date: 03/19/2017  No complication after right thoracentesis. ReadingStation:[email or web address]    Xr Chest Pa Or Ap    Result Date: 02/21/2017  No pneumothorax status post right-sided thoracentesis. ReadingStation:[email or web address]    Xr Knee 1 Or 2 Views Right    Result Date: 02/26/2017  There is a large joint effusion and linear sclerosis along the lateral aspect of the proximal tibial metaphysis which is questionable for a minimally impacted fracture. Further evaluation with cross-sectional imaging should be considered when clinically feasible. MRI would be the modality of choice as it would allow for further characterization of the calcifications along the lateral joint line which could be secondary to atherosclerosis or a meniscal tear with a small amount of extruded meniscal tissue. ReadingStation:[email or web address]    Xr Ankle Left Ap And Lateral    Result Date: 03/03/2017  Nonspecific soft tissue swelling, and degenerative changes. Vascular calcification noted. Chronic degenerative changes. Lucency of navicular bone nonspecific but could represent chronic osteomyelitis. If there is clinical concern, MRI would be modality of choice. ReadingStation:[email or web address]    Ct Head Wo Contrast    Result Date: 03/19/2017  Abnormal CT scan of the head demonstrating an area of hypodensity in the left frontal cortical region where a previous hemorrhagic infarction has been described. The area is smaller in size versus the prior study. Serial imaging for this finding is reasonable. There remains a left frontal calcified meningioma, stable. Slight stable subcortical microvascular disease changes. Mild/moderate generalized brain volume loss with mild widening of the cerebellum that may be compatible with age to  excessive. ReadingStation:[email or web address]    Ct Head Wo Contrast    Result Date: 02/24/2017  Subarachnoid hemorrhage in left frontal lobe, with surrounding vasogenic edema, loss of gray-white matter differentiation, mild effacement of left lateral ventricle, and 3 mm left-to-right midline shift. This mostly involves the region of Broca's area, which may explain the patient's difficulty with speech articulation. An underlying mass cannot be excluded. A stat MRI of the brain with contrast is recommended for further characterization. Findings were discussed with Dr. Nunzio Cory in the emergency room at 12:50pm on the 10th of December, 2018. ReadingStation:[id number]    Ct Angiogram Neck    Result Date: 02/25/2017  Abnormal CT angiogram of the neck demonstrating: 1. On the right the internal carotid is moderate stenosis of approximately 60%. The right vertebral artery is patent. 2. On the left there is mild narrowing the proximal internal carotid artery. The left vertebral artery is patent. ReadingStation:[id number]    Ct Chest W Contrast (trauma)    Result Date: 02/20/2017  IMPRESSION: Contrast CT of the chest showing a large right effusion, which is free-flowing, without evidence of underlying pleural enhancement or nodularity to suggest an exudate by CT. Areas of subsegmental atelectasis as described above, without evidence of endobronchial lesion or mass. Very significant right atrium and right ventricular enlargement with signs of right heart failure. Associated left atrial enhancement could indicate a combined right and left involvement. ReadingStation:[email or web address]    Ct Knee Right Wo Contrast    Result Date: 02/26/2017  1.  Osteoporosis, marked cartilage  loss in the lateral compartment, and chronic-appearing trabecular impaction lateral tibial plateau without lucent fracture line or depression. 2.  Large effusion and popliteal cyst. Chondrocalcinosis may be related to underlying hypercalcemia or CPPD.   ReadingStation:[email or web address]    Mri Brain W Wo Contrast    Result Date: 02/24/2017  Abnormal MRI scan of the head demonstrating: 1. There is an acute cortical infarction of the left operculofrontal region with hemorrhagic features without significant mass effect 2. There is a chronic microhemorrhage in the left thalamus 3. Moderate cortical and subcortical atrophy. 4. Mild chronic microvascular ischemic disease. ReadingStation:[id number]    Xr Chest Ap Portable    Result Date: 03/19/2017  Stable right pleural effusion and right basilar atelectasis. Stable cardiomegaly. ReadingStation:[id number]    Xr Chest Ap Portable    Result Date: 03/03/2017  IMPRESSION: Recurrent moderate right pleural effusion with middle and lower lobe atelectasis. Correlate clinically to exclude pneumonia. ReadingStation:[id number]    Xr Chest Ap Portable    Result Date: 02/20/2017  FINDINGS/IMPRESSION: Prominent consolidation of the right middle and right lower lobe, with underlying effusion, possibly partially loculated within the major fissure.. If a CT of the contrast is contemplated, it must be performed with IV contrast to characterize the consolidation and pleural process ReadingStation:[email or web address]    Ct Abdomen Pelvis Wo Iv/ W Po Cont    Result Date: 02/21/2017  1.  There is mild wall thickening along the ascending colon. This is suggestive of colitis. Findings could be infectious, inflammatory or ischemic in etiology. Consider further evaluation with endoscopy following the resolution of acute illness to exclude neoplastic wall thickening. 2.  Heterogeneous right basilar airspace disease is present and there is a small amount of residual right pleural fluid. In the setting of recent thoracentesis, airspace disease is suspected to represent resolving atelectasis. Superimposed consolidation cannot be excluded. 3.  Advanced atherosclerosis. 4.  Fibroid uterus. ReadingStation:[email or web address]    US Thoracentesis    Result Date: 03/19/2017  Successful  ultrasound-guided right thoracentesis with removal of 1.2 L of clear right pleural fluid. Fluid sent for the requested laboratory evaluation. ReadingStation:[email or web address]    US Thoracentesis    Result Date: 02/21/2017  Successful ultrasound guided drainage catheter insertion and subsequent large volume thoracentesis. ReadingStation:[email or web address]          Disposition:                                                                       [facility]            Today's date: 03/20/2017  Length of Stay: 1    Signed by: Lorrin Goodell, MD  Pager # [phone]    Total time spent with the patient/family, counseling and/or coordination of care with other physicians, other health care professionals is Time spent:::"35" minutes"   I have addressed the principle problem (listed above), the active hospital problems (listed above) and the results of laboratory tests.   Greater than 50% of the time was spent counseling and coordinating care.    [employer], [place]  [address]  [place], [place]-[id number]

## 2017-03-20 NOTE — Plan of Care (Signed)
Problem: Inadequate Gas Exchange  Goal: Adequate oxygenation and improved ventilation  Outcome: Progressing   03/19/17 2343   Goal/Interventions addressed this shift   Adequate oxygenation and improved ventilation Assess lung sounds;Monitor SpO2 and treat as needed;Monitor and treat ETCO2;Provide mechanical and oxygen support to facilitate gas exchange;Position for maximum ventilatory efficiency;Plan activities to conserve energy: plan rest periods;Increase activity as tolerated/progressive mobility;Consult/collaborate with Respiratory Therapy;Teach/reinforce use of incentive spirometer 10 times per hour while awake, cough and deep breath as needed

## 2017-03-20 NOTE — Progress Notes (Signed)
Pharmacy Vancomycin Dosing Consult Note  Kristina [name]    Assessment:   1. Day # 2 Vancomycin/Zosyn for this 77 yoF admitted from nursing home with chest pain and SOB.  CXR w/ right pleural effusion and right basilar atelectasis.  IR consulted for thoracentesis.  2. Leukocytosis trending up, afebrile, renal function WNLs and stable.  Bld/Urine/Amniotic-Pleural Fld CX's = NGTD.      Plan:   1. Vancomycin 750 mg IV q24H.  2. Renally dosed Zosyn from 3.375 Gm IV q8hrs to 2.25 Gm IV q6hrs.  3. Trough level TBD.  4. Pharmacy will follow the patient's renal function, vancomycin levels, and dosing during the course of therapy. If you have any questions, please contact the pharmacist at [phone].     Indication: PNA    Age: 84 y.o.  Height: 1.6 m (5\' 3" )155 cm  Weight:  57.2 kg (126 lb)50.6 kg  IBW: 52.4 kg  DW: 57 kg    CrCl: 40 ml/min (Using SCr = 0.8 for age > 32)     Population Estimate Pharmacokinetics:   Kel: 0.0384 hr-1        t50: 18 hrs        Vd: 35 L         Expected Trough: 14 mg/L              Date Regimen Trough Date/Time Trough Level Pt Specific Ke   1/2 750 mg q24h                             Recent Labs  Lab 03/20/17  0813 03/19/17  0112   Creatinine 0.68 0.81   BUN 11 14       Recent Labs  Lab 03/20/17  0813 03/19/17  0112   WBC 12.1* 11.8*     Temp (24hrs), Avg:98.8 F (37.1 C), Min:98.1 F (36.7 C), Max:99.9 F (37.7 C)    Cultures:       Date Source Organism Sensitivities Resistance   1/2 Blood NGTD     1/2 Amniotic/Pleural Fld NGTD     1/3 Urine

## 2017-03-20 NOTE — Progress Notes (Addendum)
03/20/17 1712   CM Review   CM Comments DCP 03/20/17:  Pt adm w/SOB. Right Pleural effusion.  Has hx of paroxysmal atrial fibrillation., HTN, and hypothyroidism.  Pt from [facility].  Pt plans to return when medically ready.  DCP confirmed information w/Admissions at [facility].  DCP to follow clinical course for changes, updates, and readiness.        [facility]   [address]   [place] [address]     Case Manager    Patient identified as a Moderate Risk for readmission based on the current risk score  Estimated D/C Date:    Risk Score: 14%   RX Coverage:   Yes   Utilize D/C Medication Program: No   Confirmed PCP with patient: name/last visit: Noel Christmas   Confirmed Transportation to F/u Appt: No   PCP F/U Appt request is placed in the Navigator: No   Anticipated DME needed for D/C: TBD   Anticipated HH, arrange if appropriate: TBD   Anticipated Placement:  Referral to DCP: Pt to return to [facility] Care:    Pt w/right pleural effusion.   CM Interventions:    DCP to follow clinical course for changes and updates.            Renda Rolls, BSW  Discharge Planner  [phone]

## 2017-03-20 NOTE — Plan of Care (Addendum)
Problem: Compromised Hemodynamic Status  Goal: Vital signs and fluid balance maintained/improved  Outcome: Progressing   03/19/17 1827   Goal/Interventions addressed this shift   Vital signs and fluid balance are maintained/improved Monitor/assess vitals and hemodynamic parameters with position changes;Monitor and compare daily weight;Monitor intake and output. Notify LIP if urine output is less than 30 mL/hour.;Monitor/assess lab values and report abnormal values       Comments: Kristina [name] had potassium level of 2.5; attending physician notified to have Potasium 40 mEq Two doses 4 hrs apart; no new concerns at this time;    1800 hrs; for repeat potasium; no new concerns at this time;

## 2017-03-21 DIAGNOSIS — J189 Pneumonia, unspecified organism: Secondary | ICD-10-CM

## 2017-03-21 LAB — ECG 12-LEAD
Patient Age: 87 years
Q-T Interval(Corrected): 465 ms
Q-T Interval: 345 ms
QRS Axis: 26 deg
QRS Duration: 81 ms
T Axis: 58 years
Ventricular Rate: 109 //min

## 2017-03-21 LAB — TROPONIN I: Troponin I: 0.01 ng/mL (ref 0.00–0.02)

## 2017-03-21 MED ORDER — VH POTASSIUM CHLORIDE CRYS ER 10 MEQ PO TBCR (WRAP)
10.00 meq | EXTENDED_RELEASE_TABLET | Freq: Every day | ORAL | Status: DC
Start: 2017-03-21 — End: 2017-03-25
  Filled 2017-03-21 (×5): qty 1

## 2017-03-21 NOTE — Progress Notes (Addendum)
PROGRESS NOTE - [facility]    Date Time: 03/21/17 4:25 PM  Patient Name: Kristina [name]  Attending Physician: Lorrin Goodell, MD    Assessment and Plan                                                       [facility]     Principal Problem:    Pleural effusion, right  Active Problems:    PAF (paroxysmal atrial fibrillation)    HTN (hypertension)    Hypothyroidism    Chronic systolic (congestive) heart failure    Cerebrovascular accident (CVA) with intracranial hemorrhage    Pleural effusion due to congestive heart failure  Resolved Problems:    * No resolved hospital problems. *    Pleural effusion, right:Likely t parapneumonic effusion with no mass seen on the CT 02/20/2017  -s/p Thoracentesis ,f/u with fluid Ldh seems high,?exudate  -ECHO showing both LV failure EF 40% and RV failure. The patient also has severe tricuspid regurgitation  -IV lasix 40mg   blood cx-no growth  Pleural fluid cx-no growth so far  Continue broad-spectrum antibiotics, on Zosyn,[place] vancomycin  White count 12,000  Cerebral infarction with hemorrhagic conversion   -Repeat CT shows mild hypodensity Left frontal cortical region where a  prior hemorrhage was present, a left frontal extra-axial calcified nodule measuring 7 mm, presumed small calcified meningioma.  -MRI on 12/13 was acute cortical infarction of the left operculofrontal region with hemorrhagic features without significant mass effect    Right/Left Congestive Heart Failure  -Very significant right atrium and right ventricular enlargement with signs of right heart failure (CT 12/6)  -ECHO 12/6 showing both LV failure EF 40% and RV failure with severe tricuspid regurgitation   continue lasix    PAF (paroxysmal atrial fibrillation) with RVR  HR in 120s  -continue Cardizem but patient is refusing  Troponin,0.01,Ekg reviewed  -No anticoagulation at this time, due for repeat CT of head and neck to reevaluate (per Dr. Nunzio Cory on previous admission)    HTN  (hypertension)  -Continue home medications,     HLD  -Cont atorvasatain    Hypothyroidism  - qd    Rheumatoid Arthritis  -Methotrexate    DVT Prophylaxis:Hx of recent GI Bleed, and hemorrhagic conversion  Diet: Regular    Code Status:DNR  PT eval.    Patient is depressed, feels overwhelmed with her medical conditions and not being able to move due to recent .CVA, discussed in detail about her current condition and the need to control heart rate and the necessity for antibiotics due to possible pneumonia. However  she is adamant about  not taking any more po  medications and clearly states she does not want any further treatment.  since patient is depressed and quite emotional to make this decision at this point I have discussed  above with the family who are requesting palliative care consult at  This time,and did not want hospice yet.  Consider a Psychiatry consult to determine her capacity to make decisions regarding her further health care needs.  Also would consult palliative care      Subjective  [facility]        stable,no acute complaints,feels weak,depressed  Refusing to take po meds,says she does not want any more treatment,Keep saying she wants to go  Tachycardic, heart rate 120s  Review Of Systems:                                                                          [facility] Hospital[facility]   negative    Physical Exam:                                                                    [facility]     Temp:  [97.5 F (36.4 C)-98.4 F (36.9 C)] 98.4 F (36.9 C)  Heart Rate:  [87-146] 115  Resp Rate:  [20-28] 24  BP: (91-122)/(58-79) 108/71    Intake/Output Summary (Last 24 hours) at 03/21/17 1625  Last data filed at 03/21/17 1300   Gross per 24 hour   Intake              730 ml   Output                0 ml   Net              730 ml       General: A/A/O, ; no acute distress,fatigued,gen.weakness  HEENT:  perrla, eomi, sclera anicteric  oropharynx clear without lesions, mucous membranes moist  Glands: No cervical or axillary lymphadenopathy.  Neck: supple, no lymphadenopathy, no thyromegaly, no JVD, no carotid bruits  Cardiovascular: Irregular, tachycardia ,no rubs or gallops  Lungs: clear to auscultation bilaterally, without wheezing, rhonchi, or rales  Abdomen: soft, non-tender, non-distended; no palpable masses, no hepatosplenomegaly, normoactive bowel sounds, no rebound or guarding  Extremities: no clubbing, cyanosis, or edema  Neuro: cranial nerves grossly intact, strength 2-3/5 in upper and lower extremities, sensation intact,   Psych: Normal affect, not depressed   Skin: no rashes or lesions noted      Meds:                                                                                  [facility] Hospital[facility]       Current Facility-Administered Medications   Medication Dose Route Frequency Provider Last Rate Last Dose   . acetaminophen (TYLENOL) tablet 650 mg  650 mg Oral Q4H PRN Hwig, Nauras, MD        Or   . acetaminophen (TYLENOL) 160 MG/5ML oral solution 650 mg  650 mg per NG tube Q4H PRN Hwig, Nauras, MD        Or   . acetaminophen (TYLENOL) suppository 650 mg  650 mg Rectal  Q4H PRN Hwig, Nauras, MD       . albuterol (PROVENTIL HFA;VENTOLIN HFA) inhaler 2 puff  2 puff Inhalation BID PRN Hwig, Nauras, MD       . aspirin chewable tablet 81 mg  81 mg Oral Daily Hwig, Nauras, MD   81 mg at 03/20/17 1126   . atorvastatin (LIPITOR) tablet 80 mg  80 mg Oral QHS Hwig, Nauras, MD   80 mg at 03/20/17 2346   . dilTIAZem (CARDIZEM CD) 24 hr capsule 240 mg  240 mg Oral QAM Hwig, Nauras, MD   240 mg at 03/20/17 0805   . docusate sodium (COLACE) capsule 100 mg  100 mg Oral Daily Hwig, Nauras, MD   100 mg at 03/20/17 1126   . folic acid (FOLVITE) tablet 1 mg  1 mg Oral Daily Hwig, Nauras, MD   1 mg at 03/20/17 1126   . furosemide (LASIX) injection 40 mg  40 mg Intravenous Once Hwig, Nauras, MD       . furosemide (LASIX)  tablet 40 mg  40 mg Oral Daily Lorrin Goodell, MD   40 mg at 03/20/17 1126   . lactobacillus species (BIO-K PLUS) capsule 50 Billion CFU  50 Billion CFU Oral Daily Hwig, Nauras, MD   50 Billion CFU at 03/20/17 1126   . latanoprost (XALATAN) 0.005 % ophthalmic solution 1 drop  1 drop Both Eyes QHS Hwig, Nauras, MD   1 drop at 03/20/17 2346   . levothyroxine (SYNTHROID, LEVOTHROID) tablet 112 mcg  112 mcg Oral QAM Hwig, Nauras, MD   112 mcg at 03/20/17 0805   . [START ON 03/24/2017] methotrexate tablet 10 mg  10 mg Oral Q MONDAY (PM) Hwig, Nauras, MD       . naloxone (NARCAN) injection 0.4 mg  0.4 mg Intravenous PRN Hwig, Nauras, MD       . nitroglycerin (NITROSTAT) SL tablet 0.4 mg  0.4 mg Sublingual Q5 Min PRN Hwig, Nauras, MD       . piperacillin-tazobactam (ZOSYN) 2.25 g IVPB 50 mL (premix) 2.25 g  2.25 g Intravenous Q6H Lorrin Goodell, MD 100 mL/hr at 03/21/17 1248 2.25 g at 03/21/17 1248   . potassium chloride (K-DUR,KLOR-CON) CR tablet 10 mEq  10 mEq Oral Daily Lorrin Goodell, MD       . senna-docusate (PERICOLACE) 8.6-50 MG per tablet 2 tablet  2 tablet Oral QHS Hwig, Nauras, MD       . sodium chloride (PF) 0.9 % injection 3 mL  3 mL Intravenous Q8H Hwig, Nauras, MD   3 mL at 03/21/17 1254   . vancomycin (VANCOCIN) 750 mg in sodium chloride 0.9 % 250 mL IVPB (vial-mate)  750 mg Intravenous Q24H Afridi, Larita Fife, MD 200 mL/hr at 03/21/17 0440 750 mg at 03/21/17 0440   . vancomycin therapy placeholder   Does not apply See Admin Instructions Lorrin Goodell, MD           Labs and Imaging:                                                              [facility]       RECENT LABS (from the last 7 days)    Recent Labs  Lab 03/20/17  0813 03/19/17  0112  WBC 12.1* 11.8*   RBC 4.07 4.13   Hemoglobin 14.2 14.6   Hematocrit 41.9 42.4   MCV 103* 103*   PLT CT 227 262       Recent Labs  Lab 03/19/17  0112   PT 11.9*   PT INR 1.2       Recent Labs  Lab 03/21/17  0923 03/19/17  0414 03/19/17  0112   Troponin I 0.01  0.02 0.02      Recent Labs  Lab 03/20/17  1836 03/20/17  0813 03/19/17  0112   Glucose  --  110* 128*   Sodium  --  133* 136   Potassium 3.4* 2.5* 3.6   Chloride  --  97* 98   CO2  --  23.5 27.3   BUN  --  11 14   Creatinine  --  0.68 0.81   EGFR  --  80 66   Calcium  --  8.7 9.6           Recent Labs  Lab 03/19/17  0414 03/19/17  0112   Albumin  --  3.4*   Protein, Total 5.4* 6.6   Bilirubin, Total  --  0.8   Alkaline Phosphatase  --  119   ALT  --  21   AST (SGOT)  --  24      Recent Labs  Lab 03/20/17  0233   Specific Gravity, UR 1.006   pH, Urine 7.0   Protein, UR Negative   Glucose, UA Negative   Ketones UA Negative   Bilirubin, UA Negative   Blood, UA Negative   Nitrite, UA Negative   Urobilinogen, UA Normal   Leukocyte Esterase, UA 25*   WBC, UA 2   RBC, UA 1   Bacteria, UA Rare*      Lab Results   Component Value Date    HGBA1CPERCNT 5.7 02/24/2017            Microbiology, reviewed and are significant for:  Microbiology Results     Procedure Component Value Units Date/Time    Blood Culture - Venipuncture # 1 [[id number]] Collected:  03/19/17 0144    Specimen:  Blood from Venipuncture Updated:  03/19/17 0617    Narrative:       Specimen/Source: Blood/Venipuncture  Collected: 03/19/2017 01:44     Status: Valued      Last Updated: 03/19/2017 06:15                Culture Result (Prelim)      Culture In Progress          Influenza A / B Rapid Test [[id number]] Collected:  03/19/17 0114    Specimen:  Nasal Wash Updated:  03/19/17 0141     Influenza A Negative     Influenza B Negative     Comment: Method: Jarvis Morgan    The sensitivity for this method is between 90% and 95% for Influenza A and around 90% for Influenza B. The specificity for both Influenza A and B is around 96%. False positive results may occur, especially when the prevalence of Influenza activity is low. This is more likely with Influenza B due to its lower prevalence. Clinical conditions, including the prevalence of influenza activity, should be  considered in the interpretation of results. If clinically indicated, results may be confirmed with PCR testing.  The above 2 analytes were performed by [facility] Main [facility] [phone])  [address],[place] [address]  Narrative:       Influenza A antigen detection tests are unable to distinquish between novel and seasonal influenza A.    A negative result for either Influenza A or B antigen does not exclude influenza virus infection. Clinical correlation required.    All positive influenza antigen tests (A or B) require placement of patient on droplet precaution isolation.    Sterile Body Fluid Culture and Smear [[id number]] Collected:  03/19/17 0945    Specimen:  Amniotic from Pleural Fluid Updated:  03/19/17 1538    Narrative:       Specimen/Source: Amniotic/Pleural Fluid  Collected: 03/19/2017 09:45     Status: Valued      Last Updated: 03/19/2017 15:38                Gram Stain (Final)      No Organisms Seen      Few WBC's Seen      Specimen performed by cytocentrifugation methodology                Imaging:  Ct Angiogram Head    Result Date: 02/25/2017  Normal CTA head. ReadingStation:[id number]    Xr Chest Pa Or Ap    Result Date: 03/19/2017  No complication after right thoracentesis. ReadingStation:[email or web address]    Xr Chest Pa Or Ap    Result Date: 02/21/2017  No pneumothorax status post right-sided thoracentesis. ReadingStation:[email or web address]    Xr Knee 1 Or 2 Views Right    Result Date: 02/26/2017  There is a large joint effusion and linear sclerosis along the lateral aspect of the proximal tibial metaphysis which is questionable for a minimally impacted fracture. Further evaluation with cross-sectional imaging should be considered when clinically feasible. MRI would be the modality of choice as it would allow for further characterization of the calcifications along the lateral joint line which could be secondary to atherosclerosis or a meniscal tear with a small amount of extruded meniscal  tissue. ReadingStation:[email or web address]    Xr Ankle Left Ap And Lateral    Result Date: 03/03/2017  Nonspecific soft tissue swelling, and degenerative changes. Vascular calcification noted. Chronic degenerative changes. Lucency of navicular bone nonspecific but could represent chronic osteomyelitis. If there is clinical concern, MRI would be modality of choice. ReadingStation:[email or web address]    Ct Head Wo Contrast    Result Date: 03/19/2017  Abnormal CT scan of the head demonstrating an area of hypodensity in the left frontal cortical region where a previous hemorrhagic infarction has been described. The area is smaller in size versus the prior study. Serial imaging for this finding is reasonable. There remains a left frontal calcified meningioma, stable. Slight stable subcortical microvascular disease changes. Mild/moderate generalized brain volume loss with mild widening of the cerebellum that may be compatible with age to excessive. ReadingStation:[email or web address]    Ct Head Wo Contrast    Result Date: 02/24/2017  Subarachnoid hemorrhage in left frontal lobe, with surrounding vasogenic edema, loss of gray-white matter differentiation, mild effacement of left lateral ventricle, and 3 mm left-to-right midline shift. This mostly involves the region of Broca's area, which may explain the patient's difficulty with speech articulation. An underlying mass cannot be excluded. A stat MRI of the brain with contrast is recommended for further characterization. Findings were discussed with Dr. Nunzio Cory in the emergency room at 12:50pm on the 10th of December, 2018. ReadingStation:[id number]    Ct Angiogram Neck    Result Date: 02/25/2017  Abnormal CT angiogram of the neck demonstrating: 1. On the right the internal carotid  is moderate stenosis of approximately 60%. The right vertebral artery is patent. 2. On the left there is mild narrowing the proximal internal carotid artery. The left vertebral artery is patent. ReadingStation:[id number]    Ct  Chest W Contrast (trauma)    Result Date: 02/20/2017  IMPRESSION: Contrast CT of the chest showing a large right effusion, which is free-flowing, without evidence of underlying pleural enhancement or nodularity to suggest an exudate by CT. Areas of subsegmental atelectasis as described above, without evidence of endobronchial lesion or mass. Very significant right atrium and right ventricular enlargement with signs of right heart failure. Associated left atrial enhancement could indicate a combined right and left involvement. ReadingStation:[email or web address]    Ct Knee Right Wo Contrast    Result Date: 02/26/2017  1.  Osteoporosis, marked cartilage loss in the lateral compartment, and chronic-appearing trabecular impaction lateral tibial plateau without lucent fracture line or depression. 2.  Large effusion and popliteal cyst. Chondrocalcinosis may be related to underlying hypercalcemia or CPPD.  ReadingStation:[email or web address]    Mri Brain W Wo Contrast    Result Date: 02/24/2017  Abnormal MRI scan of the head demonstrating: 1. There is an acute cortical infarction of the left operculofrontal region with hemorrhagic features without significant mass effect 2. There is a chronic microhemorrhage in the left thalamus 3. Moderate cortical and subcortical atrophy. 4. Mild chronic microvascular ischemic disease. ReadingStation:[id number]    Xr Chest Ap Portable    Result Date: 03/19/2017  Stable right pleural effusion and right basilar atelectasis. Stable cardiomegaly. ReadingStation:[id number]    Xr Chest Ap Portable    Result Date: 03/03/2017  IMPRESSION: Recurrent moderate right pleural effusion with middle and lower lobe atelectasis. Correlate clinically to exclude pneumonia. ReadingStation:[email or web address]    Xr Chest Ap Portable    Result Date: 02/20/2017  FINDINGS/IMPRESSION: Prominent consolidation of the right middle and right lower lobe, with underlying effusion, possibly partially loculated within the major fissure.. If a CT of the  contrast is contemplated, it must be performed with IV contrast to characterize the consolidation and pleural process ReadingStation:[email or web address]    Ct Abdomen Pelvis Wo Iv/ W Po Cont    Result Date: 02/21/2017  1.  There is mild wall thickening along the ascending colon. This is suggestive of colitis. Findings could be infectious, inflammatory or ischemic in etiology. Consider further evaluation with endoscopy following the resolution of acute illness to exclude neoplastic wall thickening. 2.  Heterogeneous right basilar airspace disease is present and there is a small amount of residual right pleural fluid. In the setting of recent thoracentesis, airspace disease is suspected to represent resolving atelectasis. Superimposed consolidation cannot be excluded. 3.  Advanced atherosclerosis. 4.  Fibroid uterus. ReadingStation:[email or web address]    US Thoracentesis    Result Date: 03/19/2017  Successful ultrasound-guided right thoracentesis with removal of 1.2 L of clear right pleural fluid. Fluid sent for the requested laboratory evaluation. ReadingStation:[email or web address]    US Thoracentesis    Result Date: 02/21/2017  Successful ultrasound guided drainage catheter insertion and subsequent large volume thoracentesis. ReadingStation:[email or web address]          Disposition:                                                                       [facility]  Today's date: 03/21/2017  Length of Stay: 2    Signed by: Lorrin Goodell, MD  Pager # [phone]    Total time spent with the patient/family, counseling and/or coordination of care with other physicians, other health care professionals is Time spent:::"40" minutes"   I have addressed the principle problem (listed above), the active hospital problems (listed above) and the results of laboratory tests.   Greater than 50% of the time was spent counseling and coordinating care.    [employer], [place]  [address]  [place], [place]

## 2017-03-21 NOTE — Progress Notes (Signed)
03/21/17 1154   CM Review   CM Comments DCP 03/21/17:  Pt not ready for [place] per MD.  Pt Ldh elevated.  Afib. IV abx; Vanc/Zosyn.  CT shows mild hypodensity left frontal cortical region; previous hemorrhage.  Showing left frontal extra axial calcified nodule measuring 7mm, presumed small calcified meningioma.  Pt has bed at [facility] when medically ready.   DCP to follow clinical course for readiness.        Renda Rolls, BSW  Discharge Planner  [phone]

## 2017-03-21 NOTE — Progress Notes (Signed)
Pharmacy Vancomycin Dosing Consult Note  Kristina [name]    Assessment:   1. Day # 3 IV Vancomycin + Day #3 Zosyn for this 45 yoF admitted from nursing home with chest pain and SOB.  CXR w/ right pleural effusion and right basilar atelectasis.  IR consulted for thoracentesis.  2. No New WBC/Scr as of note writing ,afebrile.      Plan:   1. Vancomycin 750 mg IV q24H.  2. Trough level  03/22/17.  3. Pharmacy will follow the patient's renal function, vancomycin levels, and dosing during the course of therapy. If you have any questions, please contact the pharmacist at [phone].     Indication: PNA    Age: 84 y.o.  Height: 1.6 m (5\' 3" )155 cm  Weight:  57.2 kg (126 lb)50.6 kg  IBW: 52.4 kg  DW: 57 kg    CrCl: 40 ml/min (Using SCr = 0.8 for age > 46)     Population Estimate Pharmacokinetics:   Kel: 0.03 hr-1        T50:~22 hrs        Vd: ~40 L         Expected Trough: 10-20 mg/L              Date Regimen Trough Date/Time Trough Level Pt Specific Ke   1/2 750 mg q24h  03/22/17                               Recent Labs  Lab 03/20/17  0813 03/19/17  0112   Creatinine 0.68 0.81   BUN 11 14       Recent Labs  Lab 03/20/17  0813 03/19/17  0112   WBC 12.1* 11.8*     Temp (24hrs), Avg:98.1 F (36.7 C), Min:97.5 F (36.4 C), Max:98.8 F (37.1 C)    Cultures:       Date Source Organism Sensitivities Resistance   1/2 Blood NGTD     1/2 Pleural Fld NGTD     1/3 Urine prob contaminates      1/2   FLu A &B   NEGATIVE      Microbiology Results (last 7 days)     Procedure Component Value Units Date/Time   Urine Culture [[id number]] Collected: 03/20/17 0233   Order Status: Completed Specimen: Urine from In and Out Cath Updated: 03/21/17 0946   Narrative:    Specimen/Source: Urine Specimens/In and Out Cath  Collected: 03/20/2017 02:33    Status: Final   Last Updated: 03/21/2017 09:46         Culture Result (Final)   Mixed Positive Organisms - Probable Contaminants       Sterile Body Fluid Culture and Smear [[id number]]  Collected: 03/19/17 0945   Order Status: Completed Specimen: Amniotic from Pleural Fluid Updated: 03/21/17 0815   Narrative:    Specimen/Source: Amniotic/Pleural Fluid  Collected: 03/19/2017 09:45    Status: Valued   Last Updated: 03/21/2017 08:15         Gram Stain (Final)   No Organisms Seen   Few WBC's Seen   Specimen performed by cytocentrifugation methodology    Culture Result (Prelim)   No Growth - Day 2, Reincubate       Blood Culture - Venipuncture # 1 [[id number]] Collected: 03/19/17 0144   Order Status: Completed Specimen: Blood from Venipuncture Updated: 03/20/17 1850   Narrative:    Specimen/Source: Blood/Venipuncture  Collected: 03/19/2017 01:44  Status: Valued   Last Updated: 03/20/2017 18:48         Culture Result (Prelim)   No Growth To Date       Influenza A / B Rapid Test [[id number]] Collected: 03/19/17 0114   Order Status: Completed Specimen: Nasal Wash Updated: 03/19/17 0141    Influenza A Negative    Influenza B Negative    Comment: Method: Jarvis Morgan     The sensitivity for this method is between 90% and 95% for Influenza A and around 90% for Influenza B. The specificity for both Influenza A and B is around 96%. False positive results may occur, especially when the prevalence of Influenza activity is low. This is more likely with Influenza B due to its lower prevalence. Clinical conditions, including the prevalence of influenza activity, should be considered in the interpretation of results. If clinically indicated, results may be confirmed with PCR testing.   The above 2 analytes were performed by [facility] [phone])   [address] [address]       Narrative:    Influenza A antigen detection tests are unable to distinquish between novel and seasonal influenza A.    A negative result for either Influenza A or B antigen does not exclude influenza virus infection. Clinical correlation required.    All positive  influenza antigen tests (A or B) require placement of patient on droplet precaution isolation.

## 2017-03-21 NOTE — Plan of Care (Addendum)
Problem: Compromised Hemodynamic Status  Goal: Vital signs and fluid balance maintained/improved  Outcome: Progressing   03/19/17 1827   Goal/Interventions addressed this shift   Vital signs and fluid balance are maintained/improved Monitor/assess vitals and hemodynamic parameters with position changes;Monitor and compare daily weight;Monitor intake and output. Notify LIP if urine output is less than 30 mL/hour.;Monitor/assess lab values and report abnormal values       Problem: Inadequate Gas Exchange  Goal: Adequate oxygenation and improved ventilation  Outcome: Progressing   03/19/17 2343   Goal/Interventions addressed this shift   Adequate oxygenation and improved ventilation Assess lung sounds;Monitor SpO2 and treat as needed;Monitor and treat ETCO2;Provide mechanical and oxygen support to facilitate gas exchange;Position for maximum ventilatory efficiency;Plan activities to conserve energy: plan rest periods;Increase activity as tolerated/progressive mobility;Consult/collaborate with Respiratory Therapy;Teach/reinforce use of incentive spirometer 10 times per hour while awake, cough and deep breath as needed       Comments: Ms Basques reports to "not feeling god this morning" she asked RN to notify attending physician; Dr Blake Divine ordered Troponin and EKG; no acute distress observed at this time; will continue to monitor patient; assisted with setting up meal tray;  Declined to take oral medications; attending physician present; IV Zosyn given per orders;

## 2017-03-21 NOTE — PT Plan of Care Note (Signed)
[facility]  [facility]  Department of Rehabilitation Services  [id number]  Kristina [name] CSN: [id number]  [facility] PULMONARY RENAL UNIT [id number]    Physical Therapy General Note  Time: 1053    Last seen by a Physical Therapist vs. PTA: 03/20/2017    Attempted to see patient for PT treatment, however patient refused to participate despite explaining the significance and benefits of physical activity. Patient continued to refuse and just wanted to rest. Will attempt to see for PT treatment later this evening if appropriate and scheduling permits. Advice repositioning every 2 hours and have patient assist with bed rolling/transfers. If mobilizing out of the bed for a stand pivot transfer, use assist x 2 and walker.     Team Communication: RNLuisa Hart    Plan: Will follow up later this evening (03/21/2017) per POC if appropriate and scheduling permits. If not able, will follow up over weekend to conduct 2nd treatment.      Tito Dine, PT, DPT

## 2017-03-22 DIAGNOSIS — I619 Nontraumatic intracerebral hemorrhage, unspecified: Secondary | ICD-10-CM

## 2017-03-22 DIAGNOSIS — J90 Pleural effusion, not elsewhere classified: Secondary | ICD-10-CM

## 2017-03-22 MED ORDER — NON FORMULARY
5.00 mg/h | Status: DC
Start: 2017-03-22 — End: 2017-03-22

## 2017-03-22 MED ORDER — DEXTROSE 5 % IV SOLN
5.00 mg/h | INTRAVENOUS | Status: DC
Start: 2017-03-22 — End: 2017-03-24
  Administered 2017-03-22 – 2017-03-23 (×2): 5 mg/h via INTRAVENOUS
  Filled 2017-03-22 (×2): qty 20

## 2017-03-22 MED ORDER — MIRTAZAPINE 15 MG PO TBDP
15.00 mg | ORAL_TABLET | Freq: Every evening | ORAL | Status: DC
Start: 2017-03-22 — End: 2017-03-24
  Administered 2017-03-23: 23:00:00 15 mg via ORAL
  Filled 2017-03-22 (×3): qty 1

## 2017-03-22 MED ORDER — DILTIAZEM HCL 5 MG/ML IV SOLN (WRAP)
5.00 mg | Freq: Once | INTRAVENOUS | Status: AC
Start: 2017-03-22 — End: 2017-03-22
  Administered 2017-03-22: 10:00:00 5 mg via INTRAVENOUS
  Filled 2017-03-22: qty 1

## 2017-03-22 NOTE — Consults (Addendum)
Capacity evaluation: Patient expresses interest in discontinuing by mouth medication, notably depressed by attending physician.    Patient is an 84 year old female presenting from [facility]  for shortness of breath, chest pain with a medical history of chronic A. fib, chronic chest pain, congestive heart failure, hypertension, thyroid disorder, recent CVA with recent admission due to hematochezia. Patient was discharged to [facility] for rehabilitation.    Currently patient is being treated for pleural effusion, parapneumonic effusion on CT, recommendations include antibiotic, Lasix. Patient with CT showing cerebral infarct with hemorrhagic conversion with mild hypodensity on left frontal cortical region, MRI on 12/13 with acute cortical infarction. Patient has congestive heart failure, significant right atrium and right ventricular enlargement. Patient also with proximal atrial fibrillation, patient on by mouth medications for anti-arrhythmia. Patient also treated with oral medications directed at hypertension, hyperlipidemia, hypothyroidism, rheumatoid arthritis. Patient with allow natural death CODE STATUS.    Patient began refusing medications on 1/4. Stating that she was "ready to go". Patient has been notably depressed by attending physician, tearful. Although the medications continue, patient refuses. IV medications, specifically Lasix and antibiotics have been continued and patient has been accepting.    Patient is very pleasant and cooperative with interview. She is alert, attentive and there are no signs of confusion or delirium. She is agreeable for interview and we discuss her current medical state. She begins by saying that she is very "tired", with notable fatigue and little muscle strength. She states that she's not been sleeping well since being in the hospital and returning to the hospital has been quite frustrating and depressing. She states that she was doing well in rehabilitation, moving  along in expected fashion however with return to the hospital she is very weak and feels as though she is unable to walk on her own. She is tearful, weepy, states that she just doesn't want to take any medications any longer and is "ready to go". She states that she's been depressed and grieving over her lost husband 3 years earlier when he passed. Her mood was better though when she is was an [place] just one week earlier. Her appetite is fair but poor compared to baseline. She cares for her children, speaks of them very highly, however she describes very little enjoyment in life at this time. She doesn't specifically answer the question about if she has hope in her life still. She states that she is "ready to go". We discussed how depression and emotions in general can influence decisions one way or the other, she couldn't say if this was the case. She does seem fairly adamant that she will refuse oral medications, at one point discuss starting an antidepressant that may help her mood and motivation for rehabilitation.    Specifically, patient asked questions about her medical understanding of the current situation. She is alert, oriented [facility], [place]. She acknowledges it's January, but could not find the specific date nor can she answer the year. She initially says "19..., no, 20...22". By this time in the interview it is noticeable that she is having difficulties with word finding and being able to articulate full thought. No evidence of slurred speech, face drop however patient does describe weakness and memory difficulties since stroke. Denies any personality changes or depressions related to stroke. She describes having "heart issues", however without further explanation due to word finding, resulting in frustration. "I wish I could just find words". It was noted earlier when talking to the attending that each  medical exam and medical issue was identified and  treatments were outlined. Patient was asked about alternatives in treatments and she could only explain that she was not going to take medications any further. When questioned about taking IV medications, she said that was "just as well". When asked about risks of not taking medications, she further says "I just hope they take me away". Benefits of taking medications were difficult for patient to articulate as well, "I don't know, they say that it will help but I don't think so". When asking her preference in regards to accepting medication or not, patient has been fairly consistent and adamant she was not going to take any medications any further.    Discussed case with family member, daughter ann. Son, power of attorney apparently is on his way by air flight from [place]. Daughter states that patient was doing well at rehabilitation, rehospitalization due to chest pain and chest workup was necessary. They feel she is cognitively intact however has noticed some difficulty with finding words and articulating her thoughts and feelings since the stroke. No history of mental illness, depression, previous suicidal ideation or gestures. Family states that she is "more depressed than before", compared to the last 3 years. Family says that they want a respect her mother's decision but feel that her depression is influencing her thinking at this time.    Discussed case with primary attending Dr. Blake Divine. Patient refusing medication, may or may not qualify for hospice care. No terminal illness identified. Reversible medical element of pleural effusion as well as treatment of congestive heart failure and correction of A. Fib if patient is compliant with medications.      Assessment:  Appreciate involvement in the case. 84 year old female with chronic medical issues presenting from rehabilitation with worsening of pleural effusion, congestive heart failure as well as A. fib. Patient abruptly refused medications yesterday and has  been notably depressed by staff. Capacity evaluation was requested by psychiatry to determine if patient has capacity to refuse medications.    Difficult case from a standpoint of capacity. Patient is attentive, receptive to engage and conversant. She is adamant about refusing medications which I can respect. She makes a case that she has had a long and happy life and she is "ready to go". This is clear and understandable however with regards to her current medical condition, she has chronic illness and not terminal. She is also not able to fully articulate components of a standardized capacity exam at this time. Patient has limited insight with her medical condition being that she has difficulty naming current medical issues as well as their respective treatments. The best she could do was say, "I think my heart". She also had a difficult time articulating potential benefits with her proposed treatments. Her judgment may not be completely impaired but it is certainly being influenced by the depression. Refusing medication is likely to make her more sick than to die, which she did not agree with. She is not actively suicidal however she does have clinical depressive disorder. Sleep, frustration, rehospitalization all seem to be possible players to the depressive disorder. In general, following a stroke, depression is very highly likely to occur. Combined with word finding, memory, depression seems to be possibly associated with stroke in terms of timeline.    At this time will say her judgment and insight are limited but not totally incapacitated. Serial capacity evaluations would be prudent. Capacity can be fluid in these situations and with new information or  improved patient participation, there can be more clarity with what the patients wishes and understanding.     In the meantime, family (son/POA coming from [place] which patient does not know) visiting and discussing further with patient. Would minimize  medication burden if possible, could be used as a negotiating tool for making grounds. Would believe an antidepressant would be helpful especially with sleep, energy, anxiety and sense of feeling hopeless. Will start Remeron 15 mg at bedtime.    Agree with palliative care consult to have established goals of care, possible family meeting which I would be happy to join.

## 2017-03-22 NOTE — Progress Notes (Signed)
PROGRESS NOTE - [facility]    Date Time: 03/22/17 2:30 PM  Patient Name: Kristina [name]  Attending Physician: Lorrin Goodell, MD    Assessment and Plan                                                       [facility]     Principal Problem:    Pleural effusion, right  Active Problems:    PAF (paroxysmal atrial fibrillation)    HTN (hypertension)    Hypothyroidism    Chronic systolic (congestive) heart failure    Cerebrovascular accident (CVA) with intracranial hemorrhage    Pleural effusion due to congestive heart failure  Resolved Problems:    * No resolved hospital problems. *    Pleural effusion, right:Likely t parapneumonic effusion with no mass seen on the CT 02/20/2017  -s/p Thoracentesis ,f/u with fluid Ldh seems high,?exudate  -ECHO showing both LV failure EF 40% and RV failure. The patient also has severe tricuspid regurgitation  -IV lasix 40mg   blood cx-no growth  Pleural fluid cx-no growth so far  Continue broad-spectrum antibiotics, on Zosyn,[place] vancomycin  Consider to [place] abx soon,afebrile  White count 12,000    Cerebral infarction with hemorrhagic conversion   -Repeat CT shows mild hypodensity Left frontal cortical region where a  prior hemorrhage was present, a left frontal extra-axial calcified nodule measuring 7 mm, presumed small calcified meningioma.  -MRI on 12/13 was acute cortical infarction of the left operculofrontal region with hemorrhagic features without significant mass effect    Right/Left Congestive Heart Failure  -Very significant right atrium and right ventricular enlargement with signs of right heart failure (CT 12/6)  -ECHO 12/6 showing both LV failure EF 40% and RV failure with severe tricuspid regurgitation   continue lasix    PAF (paroxysmal atrial fibrillation) with RVR  HR in 120s  -continue Cardizem po,but patient is refusing po meds,cardizem infusion 5mg /hr  Troponin,0.01,Ekg reviewed  -No anticoagulation at this time, due for repeat CT of head and neck  to reevaluate (per Dr. Nunzio Cory on previous admission)    HTN (hypertension)  -Continue home medications,     HLD  -Cont atorvasatain    Hypothyroidism  - qd    Rheumatoid Arthritis  -Methotrexate    DVT Prophylaxis:Hx of recent GI Bleed, and hemorrhagic conversion  Diet: Regular    Code Status:DNR  PT eval.,apparently patient refused     Patient is depressed, feels overwhelmed with her medical conditions and not being weak and not being able to move much due to recent .CVA, discussed in detail about her current condition and the need to control heart rate and the necessity for antibiotics due to possible pneumonia. However  she is adamant about  not taking any more po  medications and clearly states she does not want any further treatment.  since patient is depressed and quite emotional to make this decision at this point I have discussed  above with the family who are requested palliative care consult at  this time,and did not want hospice yet.  Psychiatry consulted to determine her capacity to make decisions regarding her further health care needs.  D/w Dr.Nardell  Also would consult palliative care and [facility] informational visit  Hold po meds as patient is refusing    Subjective  [facility]        stable,no acute complaints,feels weak,depressed  Refusing to take po meds,says she does not want any more treatment,Keeps saying she wants to go  Remains Tachycardic, heart rate 120s  Review Of Systems:                                                                          [facility]   negative    Physical Exam:                                                                    [employer]     Temp:  [97.9 F (36.6 C)-99 F (37.2 C)] 97.9 F (36.6 C)  Heart Rate:  [86-155] 86  Resp Rate:  [14-24] 18  BP: (100-151)/(59-88) 100/63    Intake/Output Summary (Last 24 hours) at 03/22/17 1430  Last data filed at  03/22/17 0432   Gross per 24 hour   Intake              120 ml   Output                0 ml   Net              120 ml       General: A/A/O, ; no acute distress,fatigued,gen.weakness  HEENT: perrla, eomi, sclera anicteric  oropharynx clear without lesions, mucous membranes moist  Glands: No cervical or axillary lymphadenopathy.  Neck: supple, no lymphadenopathy, no thyromegaly, no JVD, no carotid bruits  Cardiovascular: Irregular rhythm, tachycardia ,no rubs or gallops  Lungs: clear to auscultation bilaterally, without wheezing, rhonchi, or rales  Abdomen: soft, non-tender, non-distended; no palpable masses, no hepatosplenomegaly, normoactive bowel sounds, no rebound or guarding  Extremities: no clubbing, cyanosis, or edema  Neuro: cranial nerves grossly intact, strength 2//5 in upper and lower extremities, sensation intact, depressed  Psych: Normal affect, not depressed   Skin: no rashes or lesions noted      Meds:                                                                                  [facility]       Current Facility-Administered Medications   Medication Dose Route Frequency Provider Last Rate Last Dose   . acetaminophen (TYLENOL) tablet 650 mg  650 mg Oral Q4H PRN Hwig, Nauras, MD        Or   . acetaminophen (TYLENOL) 160 MG/5ML oral solution 650 mg  650 mg per NG tube Q4H PRN Hwig, Nauras, MD        Or   . acetaminophen (TYLENOL) suppository 650 mg  650  mg Rectal Q4H PRN Hwig, Nauras, MD       . albuterol (PROVENTIL HFA;VENTOLIN HFA) inhaler 2 puff  2 puff Inhalation BID PRN Hwig, Nauras, MD       . aspirin chewable tablet 81 mg  81 mg Oral Daily Hwig, Nauras, MD   81 mg at 03/20/17 1126   . atorvastatin (LIPITOR) tablet 80 mg  80 mg Oral QHS Hwig, Nauras, MD   80 mg at 03/20/17 2346   . dilTIAZem (CARDIZEM CD) 24 hr capsule 240 mg  240 mg Oral QAM Hwig, Nauras, MD   240 mg at 03/20/17 0805   . dilTIAZem (CARDIZEM) 100 mg in dextrose 5 % 100 mL infusion  5 mg/hr Intravenous Continuous Lorrin Goodell, MD 5 mL/hr at 03/22/17 1011 5 mg/hr at 03/22/17 1011   . docusate sodium (COLACE) capsule 100 mg  100 mg Oral Daily Hwig, Nauras, MD   100 mg at 03/20/17 1126   . folic acid (FOLVITE) tablet 1 mg  1 mg Oral Daily Hwig, Nauras, MD   1 mg at 03/20/17 1126   . furosemide (LASIX) injection 40 mg  40 mg Intravenous Once Hwig, Nauras, MD       . furosemide (LASIX) tablet 40 mg  40 mg Oral Daily Lorrin Goodell, MD   40 mg at 03/20/17 1126   . lactobacillus species (BIO-K PLUS) capsule 50 Billion CFU  50 Billion CFU Oral Daily Hwig, Nauras, MD   50 Billion CFU at 03/20/17 1126   . latanoprost (XALATAN) 0.005 % ophthalmic solution 1 drop  1 drop Both Eyes QHS Hwig, Nauras, MD   1 drop at 03/20/17 2346   . levothyroxine (SYNTHROID, LEVOTHROID) tablet 112 mcg  112 mcg Oral QAM Hwig, Nauras, MD   112 mcg at 03/20/17 0805   . [START ON 03/24/2017] methotrexate tablet 10 mg  10 mg Oral Q MONDAY (PM) Hwig, Nauras, MD       . naloxone (NARCAN) injection 0.4 mg  0.4 mg Intravenous PRN Hwig, Nauras, MD       . nitroglycerin (NITROSTAT) SL tablet 0.4 mg  0.4 mg Sublingual Q5 Min PRN Hwig, Nauras, MD       . piperacillin-tazobactam (ZOSYN) 2.25 g IVPB 50 mL (premix) 2.25 g  2.25 g Intravenous Q6H Lorrin Goodell, MD 100 mL/hr at 03/22/17 1208 2.25 g at 03/22/17 1208   . potassium chloride (K-DUR,KLOR-CON) CR tablet 10 mEq  10 mEq Oral Daily Lorrin Goodell, MD       . senna-docusate (PERICOLACE) 8.6-50 MG per tablet 2 tablet  2 tablet Oral QHS Hwig, Nauras, MD       . sodium chloride (PF) 0.9 % injection 3 mL  3 mL Intravenous Q8H Hwig, Nauras, MD   3 mL at 03/22/17 1208       Labs and Imaging:                                                              [facility]       RECENT LABS (from the last 7 days)    Recent Labs  Lab 03/20/17  0813 03/19/17  0112   WBC 12.1* 11.8*   RBC 4.07 4.13   Hemoglobin 14.2 14.6   Hematocrit 41.9 42.4   MCV 103*  103*   PLT CT 227 262       Recent Labs  Lab 03/19/17  0112   PT 11.9*   PT INR  1.2       Recent Labs  Lab 03/21/17  0923 03/19/17  0414 03/19/17  0112   Troponin I 0.01 0.02 0.02      Recent Labs  Lab 03/20/17  1836 03/20/17  0813 03/19/17  0112   Glucose  --  110* 128*   Sodium  --  133* 136   Potassium 3.4* 2.5* 3.6   Chloride  --  97* 98   CO2  --  23.5 27.3   BUN  --  11 14   Creatinine  --  0.68 0.81   EGFR  --  80 66   Calcium  --  8.7 9.6           Recent Labs  Lab 03/19/17  0414 03/19/17  0112   Albumin  --  3.4*   Protein, Total 5.4* 6.6   Bilirubin, Total  --  0.8   Alkaline Phosphatase  --  119   ALT  --  21   AST (SGOT)  --  24      Recent Labs  Lab 03/20/17  0233   Specific Gravity, UR 1.006   pH, Urine 7.0   Protein, UR Negative   Glucose, UA Negative   Ketones UA Negative   Bilirubin, UA Negative   Blood, UA Negative   Nitrite, UA Negative   Urobilinogen, UA Normal   Leukocyte Esterase, UA 25*   WBC, UA 2   RBC, UA 1   Bacteria, UA Rare*      Lab Results   Component Value Date    HGBA1CPERCNT 5.7 02/24/2017            Microbiology, reviewed and are significant for:  Microbiology Results     Procedure Component Value Units Date/Time    Blood Culture - Venipuncture # 1 [[id number]] Collected:  03/19/17 0144    Specimen:  Blood from Venipuncture Updated:  03/19/17 0617    Narrative:       Specimen/Source: Blood/Venipuncture  Collected: 03/19/2017 01:44     Status: Valued      Last Updated: 03/19/2017 06:15                Culture Result (Prelim)      Culture In Progress          Influenza A / B Rapid Test [[id number]] Collected:  03/19/17 0114    Specimen:  Nasal [place] Updated:  03/19/17 0141     Influenza A Negative     Influenza B Negative     Comment: Method: Jarvis Morgan    The sensitivity for this method is between 90% and 95% for Influenza A and around 90% for Influenza B. The specificity for both Influenza A and B is around 96%. False positive results may occur, especially when the prevalence of Influenza activity is low. This is more likely with Influenza B due to its lower  prevalence. Clinical conditions, including the prevalence of influenza activity, should be considered in the interpretation of results. If clinically indicated, results may be confirmed with PCR testing.  The above 2 analytes were performed by [facility] [phone])  [address] [address]         Narrative:       Influenza A antigen detection tests are unable to distinquish between  novel and seasonal influenza A.    A negative result for either Influenza A or B antigen does not exclude influenza virus infection. Clinical correlation required.    All positive influenza antigen tests (A or B) require placement of patient on droplet precaution isolation.    Sterile Body Fluid Culture and Smear [[id number]] Collected:  03/19/17 0945    Specimen:  Amniotic from Pleural Fluid Updated:  03/19/17 1538    Narrative:       Specimen/Source: Amniotic/Pleural Fluid  Collected: 03/19/2017 09:45     Status: Valued      Last Updated: 03/19/2017 15:38                Gram Stain (Final)      No Organisms Seen      Few WBC's Seen      Specimen performed by cytocentrifugation methodology                Imaging:  Ct Angiogram Head    Result Date: 02/25/2017  Normal CTA head. ReadingStation:[id number]    Xr Chest Pa Or Ap    Result Date: 03/19/2017  No complication after right thoracentesis. ReadingStation:[email or web address]    Xr Chest Pa Or Ap    Result Date: 02/21/2017  No pneumothorax status post right-sided thoracentesis. ReadingStation:[email or web address]    Xr Knee 1 Or 2 Views Right    Result Date: 02/26/2017  There is a large joint effusion and linear sclerosis along the lateral aspect of the proximal tibial metaphysis which is questionable for a minimally impacted fracture. Further evaluation with cross-sectional imaging should be considered when clinically feasible. MRI would be the modality of choice as it would allow for further characterization of the calcifications along the lateral joint line which could be  secondary to atherosclerosis or a meniscal tear with a small amount of extruded meniscal tissue. ReadingStation:[email or web address]    Xr Ankle Left Ap And Lateral    Result Date: 03/03/2017  Nonspecific soft tissue swelling, and degenerative changes. Vascular calcification noted. Chronic degenerative changes. Lucency of navicular bone nonspecific but could represent chronic osteomyelitis. If there is clinical concern, MRI would be modality of choice. ReadingStation:[email or web address]    Ct Head Wo Contrast    Result Date: 03/19/2017  Abnormal CT scan of the head demonstrating an area of hypodensity in the left frontal cortical region where a previous hemorrhagic infarction has been described. The area is smaller in size versus the prior study. Serial imaging for this finding is reasonable. There remains a left frontal calcified meningioma, stable. Slight stable subcortical microvascular disease changes. Mild/moderate generalized brain volume loss with mild widening of the cerebellum that may be compatible with age to excessive. ReadingStation:[email or web address]    Ct Head Wo Contrast    Result Date: 02/24/2017  Subarachnoid hemorrhage in left frontal lobe, with surrounding vasogenic edema, loss of gray-white matter differentiation, mild effacement of left lateral ventricle, and 3 mm left-to-right midline shift. This mostly involves the region of Broca's area, which may explain the patient's difficulty with speech articulation. An underlying mass cannot be excluded. A stat MRI of the brain with contrast is recommended for further characterization. Findings were discussed with Dr. Nunzio Cory in the emergency room at 12:50pm on the 10th of December, 2018. ReadingStation:[id number]    Ct Angiogram Neck    Result Date: 02/25/2017  Abnormal CT angiogram of the neck demonstrating: 1. On the right the internal carotid is moderate stenosis of approximately 60%. The right vertebral artery is patent. 2. On the left there  is mild narrowing the proximal internal  carotid artery. The left vertebral artery is patent. ReadingStation:[id number]    Ct Knee Right Wo Contrast    Result Date: 02/26/2017  1.  Osteoporosis, marked cartilage loss in the lateral compartment, and chronic-appearing trabecular impaction lateral tibial plateau without lucent fracture line or depression. 2.  Large effusion and popliteal cyst. Chondrocalcinosis may be related to underlying hypercalcemia or CPPD.  ReadingStation:[email or web address]    Mri Brain W Wo Contrast    Result Date: 02/24/2017  Abnormal MRI scan of the head demonstrating: 1. There is an acute cortical infarction of the left operculofrontal region with hemorrhagic features without significant mass effect 2. There is a chronic microhemorrhage in the left thalamus 3. Moderate cortical and subcortical atrophy. 4. Mild chronic microvascular ischemic disease. ReadingStation:[id number]    Xr Chest Ap Portable    Result Date: 03/19/2017  Stable right pleural effusion and right basilar atelectasis. Stable cardiomegaly. ReadingStation:[id number]    Xr Chest Ap Portable    Result Date: 03/03/2017  IMPRESSION: Recurrent moderate right pleural effusion with middle and lower lobe atelectasis. Correlate clinically to exclude pneumonia. ReadingStation:[id number]    Ct Abdomen Pelvis Wo Iv/ W Po Cont    Result Date: 02/21/2017  1.  There is mild wall thickening along the ascending colon. This is suggestive of colitis. Findings could be infectious, inflammatory or ischemic in etiology. Consider further evaluation with endoscopy following the resolution of acute illness to exclude neoplastic wall thickening. 2.  Heterogeneous right basilar airspace disease is present and there is a small amount of residual right pleural fluid. In the setting of recent thoracentesis, airspace disease is suspected to represent resolving atelectasis. Superimposed consolidation cannot be excluded. 3.  Advanced atherosclerosis. 4.  Fibroid uterus. ReadingStation:[email or web address]    US  Thoracentesis    Result Date: 03/19/2017  Successful ultrasound-guided right thoracentesis with removal of 1.2 L of clear right pleural fluid. Fluid sent for the requested laboratory evaluation. ReadingStation:[email or web address]    US Thoracentesis    Result Date: 02/21/2017  Successful ultrasound guided drainage catheter insertion and subsequent large volume thoracentesis. ReadingStation:[email or web address]          Disposition:                                                                       [facility]            Today's date: 03/22/2017  Length of Stay: 3    Signed by: Lorrin Goodell, MD  Pager # [phone]    Total time spent with the patient/family, counseling and/or coordination of care with other physicians, other health care professionals is Time spent:::"40" minutes"   I have addressed the principle problem (listed above), the active hospital problems (listed above) and the results of laboratory tests.   Greater than 50% of the time was spent counseling and coordinating care.    [employer], [place]  [address]  [place], [place]

## 2017-03-22 NOTE — Plan of Care (Signed)
Problem: Inadequate Gas Exchange  Goal: Adequate oxygenation and improved ventilation  Outcome: Progressing   03/19/17 2343   Goal/Interventions addressed this shift   Adequate oxygenation and improved ventilation Assess lung sounds;Monitor SpO2 and treat as needed;Monitor and treat ETCO2;Provide mechanical and oxygen support to facilitate gas exchange;Position for maximum ventilatory efficiency;Plan activities to conserve energy: plan rest periods;Increase activity as tolerated/progressive mobility;Consult/collaborate with Respiratory Therapy;Teach/reinforce use of incentive spirometer 10 times per hour while awake, cough and deep breath as needed

## 2017-03-22 NOTE — Plan of Care (Signed)
Problem: Compromised Hemodynamic Status  Goal: Vital signs and fluid balance maintained/improved  Outcome: Progressing  Notified Dr. Blake Divine this am of HR 140's, a fib and pt refusing po medications. Orders noted. Cardizem gtt started per orders. HR currently 110's-120's. Pt angry with nurse stating she did not want IV medication. Informed her that her heart could not be left to continually increase in heart rate and irregular. Also informed pt that her family requested that she still get what was necessary for now. Pt did not answer the nurse when the Cardizem gtt was started. [name] arrived at the room when Dr. Blake Divine arrived at the room. Dr. Blake Divine was able to discuss plan of care with both the pt and the daughter. Pt less upset after this meeting.    03/19/17 1827   Goal/Interventions addressed this shift   Vital signs and fluid balance are maintained/improved Monitor/assess vitals and hemodynamic parameters with position changes;Monitor and compare daily weight;Monitor intake and output. Notify LIP if urine output is less than 30 mL/hour.;Monitor/assess lab values and report abnormal values       Problem: Inadequate Gas Exchange  Goal: Adequate oxygenation and improved ventilation  Outcome: Progressing  Pt wearing oxygen for now. SaO2 96% on 3 l nc.   03/19/17 2343   Goal/Interventions addressed this shift   Adequate oxygenation and improved ventilation Assess lung sounds;Monitor SpO2 and treat as needed;Monitor and treat ETCO2;Provide mechanical and oxygen support to facilitate gas exchange;Position for maximum ventilatory efficiency;Plan activities to conserve energy: plan rest periods;Increase activity as tolerated/progressive mobility;Consult/collaborate with Respiratory Therapy;Teach/reinforce use of incentive spirometer 10 times per hour while awake, cough and deep breath as needed     Goal: Patent Airway maintained  Outcome: Progressing      Problem: Moderate/High Fall Risk Score >5  Goal: Patient will  remain free of falls  Outcome: Progressing   03/22/17 1442   OTHER   Moderate Risk (6-13) LOW-Fall Interventions Appropriate for Low Fall Risk;LOW-Anticoagulation education for injury risk;LOW-([facility] Only) Continuous video monitoring       Problem: Compromised Tissue integrity  Goal: Damaged tissue is healing and protected  Outcome: Progressing   03/19/17 1827   Goal/Interventions addressed this shift   Damaged tissue is healing and protected  Monitor/assess Braden scale every shift;Reposition patient every 2 hours and as needed unless able to reposition self;Increase activity as tolerated/progressive mobility;Relieve pressure to bony prominences for patients at moderate and high risk;Avoid shearing injuries;Keep intact skin clean and dry;Use bath wipes, not soap and water, for daily bathing;Encourage use of lotion/moisturizer on skin;Monitor patient's hygiene practices     Goal: Nutritional status is improving  Outcome: Progressing

## 2017-03-23 MED ORDER — DOCUSATE SODIUM 100 MG PO CAPS
100.00 mg | ORAL_CAPSULE | Freq: Every day | ORAL | Status: DC | PRN
Start: 2017-03-23 — End: 2017-03-25

## 2017-03-23 NOTE — Plan of Care (Signed)
Problem: Compromised Hemodynamic Status  Goal: Vital signs and fluid balance maintained/improved  Outcome: Progressing  HR 90's-130's and irregular. Cardizem gtt still infusing.    03/19/17 1827   Goal/Interventions addressed this shift   Vital signs and fluid balance are maintained/improved Monitor/assess vitals and hemodynamic parameters with position changes;Monitor and compare daily weight;Monitor intake and output. Notify LIP if urine output is less than 30 mL/hour.;Monitor/assess lab values and report abnormal values       Problem: Inadequate Gas Exchange  Goal: Adequate oxygenation and improved ventilation  Outcome: Progressing   03/19/17 2343   Goal/Interventions addressed this shift   Adequate oxygenation and improved ventilation Assess lung sounds;Monitor SpO2 and treat as needed;Monitor and treat ETCO2;Provide mechanical and oxygen support to facilitate gas exchange;Position for maximum ventilatory efficiency;Plan activities to conserve energy: plan rest periods;Increase activity as tolerated/progressive mobility;Consult/collaborate with Respiratory Therapy;Teach/reinforce use of incentive spirometer 10 times per hour while awake, cough and deep breath as needed     Goal: Patent Airway maintained  Outcome: Progressing   03/22/17 1442   Goal/Interventions addressed this shift   Patent airway maintained  Position patient for maximum ventilatory efficiency;Provide adequate fluid intake to liquefy secretions;Suction secretions as needed;Reinforce use of ordered respiratory interventions (i.e. CPAP, BiPAP, Incentive Spirometer, Acapella, etc.);Reposition patient every 2 hours and as needed unless able to self-reposition       Problem: Inadequate Airway Clearance  Goal: Normal respiratory rate/effort achieved/maintained  Outcome: Progressing   03/23/17 1116   Goal/Interventions addressed this shift   Normal respiratory rate/effort achieved/maintained Plan activities to conserve energy: plan rest periods        Problem: Moderate/High Fall Risk Score >5  Goal: Patient will remain free of falls  Outcome: Progressing   03/22/17 1442   OTHER   Moderate Risk (6-13) LOW-Fall Interventions Appropriate for Low Fall Risk;LOW-Anticoagulation education for injury risk;LOW-([facility] Only) Continuous video monitoring       Problem: Compromised Tissue integrity  Goal: Damaged tissue is healing and protected  Outcome: Progressing   03/19/17 1827   Goal/Interventions addressed this shift   Damaged tissue is healing and protected  Monitor/assess Braden scale every shift;Reposition patient every 2 hours and as needed unless able to reposition self;Increase activity as tolerated/progressive mobility;Relieve pressure to bony prominences for patients at moderate and high risk;Avoid shearing injuries;Keep intact skin clean and dry;Use bath wipes, not soap and water, for daily bathing;Encourage use of lotion/moisturizer on skin;Monitor patient's hygiene practices     Goal: Nutritional status is improving  Outcome: Progressing   03/22/17 1442   Goal/Interventions addressed this shift   Nutritional status is improving Assist patient with eating;Allow adequate time for meals;Encourage patient to take dietary supplement(s) as ordered;Collaborate with Clinical Nutritionist;Include patient/patient care companion in decisions related to nutrition

## 2017-03-23 NOTE — Plan of Care (Signed)
Problem: Compromised Hemodynamic Status  Goal: Vital signs and fluid balance maintained/improved  Outcome: Progressing   03/19/17 1827   Goal/Interventions addressed this shift   Vital signs and fluid balance are maintained/improved Monitor/assess vitals and hemodynamic parameters with position changes;Monitor and compare daily weight;Monitor intake and output. Notify LIP if urine output is less than 30 mL/hour.;Monitor/assess lab values and report abnormal values       Problem: Inadequate Gas Exchange  Goal: Adequate oxygenation and improved ventilation  Outcome: Progressing   03/19/17 2343   Goal/Interventions addressed this shift   Adequate oxygenation and improved ventilation Assess lung sounds;Monitor SpO2 and treat as needed;Monitor and treat ETCO2;Provide mechanical and oxygen support to facilitate gas exchange;Position for maximum ventilatory efficiency;Plan activities to conserve energy: plan rest periods;Increase activity as tolerated/progressive mobility;Consult/collaborate with Respiratory Therapy;Teach/reinforce use of incentive spirometer 10 times per hour while awake, cough and deep breath as needed       Problem: Moderate/High Fall Risk Score >5  Goal: Patient will remain free of falls  Outcome: Progressing   03/22/17 1442   OTHER   Moderate Risk (6-13) LOW-Fall Interventions Appropriate for Low Fall Risk;LOW-Anticoagulation education for injury risk;LOW-(VH Only) Continuous video monitoring       Comments: Assumed care of patient at 1900. Patient denies any pain. Patient A/O x2-3. Patient refusing all PO medications. Explained to patient importance of medication compliance, patient continued to refuse medications. Patient on Cardizem gtt, HR continues to trend in 100-120s. Receiving scheduled IV abx. No further needs at this time. Call bell within reach. Will continue to monitor.

## 2017-03-23 NOTE — Progress Notes (Signed)
PROGRESS NOTE - [facility]    Date Time: 03/23/17 3:37 PM  Patient Name: Kristina [name]  Attending Physician: Lorrin Goodell, MD    Assessment and Plan                                                       [facility]     Principal Problem:    Pleural effusion, right  Active Problems:    PAF (paroxysmal atrial fibrillation)    HTN (hypertension)    Hypothyroidism    Chronic systolic (congestive) heart failure    Cerebrovascular accident (CVA) with intracranial hemorrhage    Pleural effusion due to congestive heart failure  Resolved Problems:    * No resolved hospital problems. *    Pleural effusion, right:Likely t parapneumonic effusion with no mass seen on the CT 02/20/2017  -s/p Thoracentesis , fluid Ldh seems high,?exudate  -ECHO showing both LV failure EF 40% and RV failure. The patient also has severe tricuspid regurgitation  -IV lasix 40mg   blood cx-no growth  Pleural fluid cx-no growth so far  Would Laird abx,white count 12,000,afebrile    Cerebral infarction with hemorrhagic conversion   -Repeat CT shows mild hypodensity Left frontal cortical region where a  prior hemorrhage was present, a left frontal extra-axial calcified nodule measuring 7 mm, presumed small calcified meningioma.  -MRI on 12/13 was acute cortical infarction of the left operculofrontal region with hemorrhagic features without significant mass effect    Right/Left Congestive Heart Failure  -Very significant right atrium and right ventricular enlargement with signs of right heart failure (CT 12/6)  -ECHO 12/6 showing both LV failure EF 40% and RV failure with severe tricuspid regurgitation   continue lasix    PAF (paroxysmal atrial fibrillation) with RVR  HR in 120s  -resume Cardizem po, [place] cardizem infusion 5mg /hr later today  Troponin,0.01,Ekg reviewed  -No anticoagulation at this time, due for repeat CT of head and neck to reevaluate (per Dr. Nunzio Cory on previous admission)  bmp,magnesium in am    HTN  (hypertension)  -Continue home medications,     HLD  -Cont atorvasatain    Hypothyroidism  tsh wnl,continue levothyroxin qd    Rheumatoid Arthritis  -Methotrexate    DVT Prophylaxis:Hx of recent GI Bleed, and hemorrhagic conversion  Diet: Regular    Code Status:DNR  PT eval.,apparently patient refused     Patient is depressed, feels overwhelmed with her medical conditions and not being weak and not being able to move much due to recent CVA, discussed in detail about her current condition and the need to control heart rate and the necessity for antibiotics due to possible pneumonia. However  she is adamant about  not taking any more po  medications and clearly states she does not want any further treatment.  since patient is depressed and quite emotional to make this decision at this point I have discussed  above with the family who are requested palliative care consult at  this time,and did not want hospice yet.  Psychiatry consulted to determine her capacity to make decisions regarding her further health care needs.  D/w Dr.Nardellli.her depression seem to be influencing her judgment at this time.  Also  consulted Palliative care.  Her son is visiting from out of town today and after she spoke to the son  on and after another discussion patient is feeling to take her po meds including antidepressant.  Antibiotics are discontinued today as cultures remained negative      Subjective                                                                          [facility]        stable,no acute complaints,feels weak,depressed  Family at bed side,as per son after he spoke to her she is willing to take po meds but does not want any aggressive Rx.  Tachycardia improved  Review Of Systems:                                                                          [facility]   negative    Physical Exam:                                                                    [facility]     Temp:   [98 F (36.7 C)-98.4 F (36.9 C)] 98 F (36.7 C)  Heart Rate:  [72-125] 104  Resp Rate:  [19-24] 19  BP: (124-158)/(78-94) 124/78    Intake/Output Summary (Last 24 hours) at 03/23/17 1537  Last data filed at 03/23/17 1503   Gross per 24 hour   Intake              760 ml   Output                0 ml   Net              760 ml       General: A/A/O, ; no acute distress,fatigued,gen.weakness  HEENT: perrla, eomi, sclera anicteric  oropharynx clear without lesions, mucous membranes moist  Glands: No cervical or axillary lymphadenopathy.  Neck: supple, no lymphadenopathy, no thyromegaly, no JVD, no carotid bruits  Cardiovascular: Irregular rhythm, tachycardia ,no rubs or gallops  Lungs: clear to auscultation bilaterally, without wheezing, rhonchi, or rales  Abdomen: soft, non-tender, non-distended; no palpable masses, no hepatosplenomegaly, normoactive bowel sounds, no rebound or guarding  Extremities: no clubbing, cyanosis, or edema  Neuro: cranial nerves grossly intact, strength 2//5 in upper and lower extremities, sensation intact, depressed  Psych: Normal affect, not depressed     Meds:  [facility]       Current Facility-Administered Medications   Medication Dose Route Frequency Provider Last Rate Last Dose   . acetaminophen (TYLENOL) tablet 650 mg  650 mg Oral Q4H PRN Hwig, Nauras, MD        Or   . acetaminophen (TYLENOL) 160 MG/5ML oral solution 650 mg  650 mg per NG tube Q4H PRN Hwig, Nauras, MD        Or   . acetaminophen (TYLENOL) suppository 650 mg  650 mg Rectal Q4H PRN Hwig, Nauras, MD       . albuterol (PROVENTIL HFA;VENTOLIN HFA) inhaler 2 puff  2 puff Inhalation BID PRN Hwig, Nauras, MD       . aspirin chewable tablet 81 mg  81 mg Oral Daily Hwig, Nauras, MD   81 mg at 03/20/17 1126   . atorvastatin (LIPITOR) tablet 80 mg  80 mg Oral QHS Hwig, Nauras, MD   80 mg at 03/20/17 2346   . dilTIAZem (CARDIZEM CD) 24 hr  capsule 240 mg  240 mg Oral QAM Hwig, Nauras, MD   240 mg at 03/20/17 0805   . dilTIAZem (CARDIZEM) 100 mg in dextrose 5 % 100 mL infusion  5 mg/hr Intravenous Continuous Lorrin Goodell, MD 5 mL/hr at 03/23/17 0031 5 mg/hr at 03/23/17 0031   . docusate sodium (COLACE) capsule 100 mg  100 mg Oral Daily Hwig, Nauras, MD   100 mg at 03/20/17 1126   . folic acid (FOLVITE) tablet 1 mg  1 mg Oral Daily Hwig, Nauras, MD   1 mg at 03/20/17 1126   . furosemide (LASIX) injection 40 mg  40 mg Intravenous Once Hwig, Nauras, MD       . furosemide (LASIX) tablet 40 mg  40 mg Oral Daily Lorrin Goodell, MD   40 mg at 03/20/17 1126   . lactobacillus species (BIO-K PLUS) capsule 50 Billion CFU  50 Billion CFU Oral Daily Hwig, Nauras, MD   50 Billion CFU at 03/20/17 1126   . latanoprost (XALATAN) 0.005 % ophthalmic solution 1 drop  1 drop Both Eyes QHS Hwig, Nauras, MD   1 drop at 03/20/17 2346   . levothyroxine (SYNTHROID, LEVOTHROID) tablet 112 mcg  112 mcg Oral QAM Hwig, Nauras, MD   112 mcg at 03/20/17 0805   . [START ON 03/24/2017] methotrexate tablet 10 mg  10 mg Oral Q MONDAY (PM) Hwig, Nauras, MD       . mirtazapine (REMERON SOL-TAB) disintegrating tablet 15 mg  15 mg Oral QHS Nardelli, Louis J, DO       . naloxone (NARCAN) injection 0.4 mg  0.4 mg Intravenous PRN Hwig, Nauras, MD       . nitroglycerin (NITROSTAT) SL tablet 0.4 mg  0.4 mg Sublingual Q5 Min PRN Hwig, Nauras, MD       . potassium chloride (K-DUR,KLOR-CON) CR tablet 10 mEq  10 mEq Oral Daily Bellah Alia, MD       . senna-docusate (PERICOLACE) 8.6-50 MG per tablet 2 tablet  2 tablet Oral QHS Hwig, Nauras, MD       . sodium chloride (PF) 0.9 % injection 3 mL  3 mL Intravenous Q8H Hwig, Nauras, MD   3 mL at 03/23/17 1125       Labs and Imaging:  [facility]       RECENT LABS (from the last 7 days)    Recent Labs  Lab 03/20/17  0813 03/19/17  0112   WBC 12.1* 11.8*   RBC 4.07 4.13   Hemoglobin 14.2  14.6   Hematocrit 41.9 42.4   MCV 103* 103*   PLT CT 227 262       Recent Labs  Lab 03/19/17  0112   PT 11.9*   PT INR 1.2       Recent Labs  Lab 03/21/17  0923 03/19/17  0414 03/19/17  0112   Troponin I 0.01 0.02 0.02      Recent Labs  Lab 03/20/17  1836 03/20/17  0813 03/19/17  0112   Glucose  --  110* 128*   Sodium  --  133* 136   Potassium 3.4* 2.5* 3.6   Chloride  --  97* 98   CO2  --  23.5 27.3   BUN  --  11 14   Creatinine  --  0.68 0.81   EGFR  --  80 66   Calcium  --  8.7 9.6           Recent Labs  Lab 03/19/17  0414 03/19/17  0112   Albumin  --  3.4*   Protein, Total 5.4* 6.6   Bilirubin, Total  --  0.8   Alkaline Phosphatase  --  119   ALT  --  21   AST (SGOT)  --  24      Recent Labs  Lab 03/20/17  0233   Specific Gravity, UR 1.006   pH, Urine 7.0   Protein, UR Negative   Glucose, UA Negative   Ketones UA Negative   Bilirubin, UA Negative   Blood, UA Negative   Nitrite, UA Negative   Urobilinogen, UA Normal   Leukocyte Esterase, UA 25*   WBC, UA 2   RBC, UA 1   Bacteria, UA Rare*      Lab Results   Component Value Date    HGBA1CPERCNT 5.7 02/24/2017            Microbiology, reviewed and are significant for:  Microbiology Results     Procedure Component Value Units Date/Time    Blood Culture - Venipuncture # 1 [[id number]] Collected:  03/19/17 0144    Specimen:  Blood from Venipuncture Updated:  03/19/17 0617    Narrative:       Specimen/Source: Blood/Venipuncture  Collected: 03/19/2017 01:44     Status: Valued      Last Updated: 03/19/2017 06:15                Culture Result (Prelim)      Culture In Progress          Influenza A / B Rapid Test [[id number]] Collected:  03/19/17 0114    Specimen:  Nasal Wash Updated:  03/19/17 0141     Influenza A Negative     Influenza B Negative     Comment: Method: Jarvis Morgan    The sensitivity for this method is between 90% and 95% for Influenza A and around 90% for Influenza B. The specificity for both Influenza A and B is around 96%. False positive results may occur,  especially when the prevalence of Influenza activity is low. This is more likely with Influenza B due to its lower prevalence. Clinical conditions, including the prevalence of influenza activity, should be considered in the interpretation of results. If clinically  indicated, results may be confirmed with PCR testing.  The above 2 analytes were performed by [facility] [phone])  [address] [address]         Narrative:       Influenza A antigen detection tests are unable to distinquish between novel and seasonal influenza A.    A negative result for either Influenza A or B antigen does not exclude influenza virus infection. Clinical correlation required.    All positive influenza antigen tests (A or B) require placement of patient on droplet precaution isolation.    Sterile Body Fluid Culture and Smear [[id number]] Collected:  03/19/17 0945    Specimen:  Amniotic from Pleural Fluid Updated:  03/19/17 1538    Narrative:       Specimen/Source: Amniotic/Pleural Fluid  Collected: 03/19/2017 09:45     Status: Valued      Last Updated: 03/19/2017 15:38                Gram Stain (Final)      No Organisms Seen      Few WBC's Seen      Specimen performed by cytocentrifugation methodology                Imaging:  Ct Angiogram Head    Result Date: 02/25/2017  Normal CTA head. ReadingStation:[id number]    Xr Chest Pa Or Ap    Result Date: 03/19/2017  No complication after right thoracentesis. ReadingStation:[email or web address]    Xr Knee 1 Or 2 Views Right    Result Date: 02/26/2017  There is a large joint effusion and linear sclerosis along the lateral aspect of the proximal tibial metaphysis which is questionable for a minimally impacted fracture. Further evaluation with cross-sectional imaging should be considered when clinically feasible. MRI would be the modality of choice as it would allow for further characterization of the calcifications along the lateral joint line which could be secondary to  atherosclerosis or a meniscal tear with a small amount of extruded meniscal tissue. ReadingStation:[email or web address]    Xr Ankle Left Ap And Lateral    Result Date: 03/03/2017  Nonspecific soft tissue swelling, and degenerative changes. Vascular calcification noted. Chronic degenerative changes. Lucency of navicular bone nonspecific but could represent chronic osteomyelitis. If there is clinical concern, MRI would be modality of choice. ReadingStation:[email or web address]    Ct Head Wo Contrast    Result Date: 03/19/2017  Abnormal CT scan of the head demonstrating an area of hypodensity in the left frontal cortical region where a previous hemorrhagic infarction has been described. The area is smaller in size versus the prior study. Serial imaging for this finding is reasonable. There remains a left frontal calcified meningioma, stable. Slight stable subcortical microvascular disease changes. Mild/moderate generalized brain volume loss with mild widening of the cerebellum that may be compatible with age to excessive. ReadingStation:[email or web address]    Ct Head Wo Contrast    Result Date: 02/24/2017  Subarachnoid hemorrhage in left frontal lobe, with surrounding vasogenic edema, loss of gray-white matter differentiation, mild effacement of left lateral ventricle, and 3 mm left-to-right midline shift. This mostly involves the region of Broca's area, which may explain the patient's difficulty with speech articulation. An underlying mass cannot be excluded. A stat MRI of the brain with contrast is recommended for further characterization. Findings were discussed with Dr. Nunzio Cory in the emergency room at 12:50pm on the 10th of December, 2018. ReadingStation:[id number]    Ct Angiogram Neck    Result Date: 02/25/2017  Abnormal  CT angiogram of the neck demonstrating: 1. On the right the internal carotid is moderate stenosis of approximately 60%. The right vertebral artery is patent. 2. On the left there is mild narrowing the proximal internal carotid artery.  The left vertebral artery is patent. ReadingStation:[id number]    Ct Knee Right Wo Contrast    Result Date: 02/26/2017  1.  Osteoporosis, marked cartilage loss in the lateral compartment, and chronic-appearing trabecular impaction lateral tibial plateau without lucent fracture line or depression. 2.  Large effusion and popliteal cyst. Chondrocalcinosis may be related to underlying hypercalcemia or CPPD.  ReadingStation:[email or web address]    Mri Brain W Wo Contrast    Result Date: 02/24/2017  Abnormal MRI scan of the head demonstrating: 1. There is an acute cortical infarction of the left operculofrontal region with hemorrhagic features without significant mass effect 2. There is a chronic microhemorrhage in the left thalamus 3. Moderate cortical and subcortical atrophy. 4. Mild chronic microvascular ischemic disease. ReadingStation:[id number]    Xr Chest Ap Portable    Result Date: 03/19/2017  Stable right pleural effusion and right basilar atelectasis. Stable cardiomegaly. ReadingStation:[id number]    Xr Chest Ap Portable    Result Date: 03/03/2017  IMPRESSION: Recurrent moderate right pleural effusion with middle and lower lobe atelectasis. Correlate clinically to exclude pneumonia. ReadingStation:[email or web address]    Ct Abdomen Pelvis Wo Iv/ W Po Cont    Result Date: 02/21/2017  1.  There is mild wall thickening along the ascending colon. This is suggestive of colitis. Findings could be infectious, inflammatory or ischemic in etiology. Consider further evaluation with endoscopy following the resolution of acute illness to exclude neoplastic wall thickening. 2.  Heterogeneous right basilar airspace disease is present and there is a small amount of residual right pleural fluid. In the setting of recent thoracentesis, airspace disease is suspected to represent resolving atelectasis. Superimposed consolidation cannot be excluded. 3.  Advanced atherosclerosis. 4.  Fibroid uterus. ReadingStation:[email or web address]    US  Thoracentesis    Result Date: 03/19/2017  Successful ultrasound-guided right thoracentesis with removal of 1.2 L of clear right pleural fluid. Fluid sent for the requested laboratory evaluation. ReadingStation:[email or web address]          Disposition:                                                                       [facility]            Today's date: 03/23/2017  Length of Stay: 4    Signed by: Lorrin Goodell, MD  Pager # [phone]    Total time spent with the patient/family, counseling and/or coordination of care with other physicians, other health care professionals is Time spent:::"35" minutes"   I have addressed the principle problem (listed above), the active hospital problems (listed above) and the results of laboratory tests.   Greater than 50% of the time was spent counseling and coordinating care.    [employer], [place]  [address]  [place], [id number]

## 2017-03-24 DIAGNOSIS — I5022 Chronic systolic (congestive) heart failure: Secondary | ICD-10-CM

## 2017-03-24 DIAGNOSIS — I509 Heart failure, unspecified: Secondary | ICD-10-CM

## 2017-03-24 LAB — BASIC METABOLIC PANEL
Anion Gap: 12 mMol/L (ref 7.0–18.0)
BUN / Creatinine Ratio: 20 Ratio (ref 10.0–30.0)
BUN: 13 mg/dL (ref 7–22)
CO2: 24.9 mMol/L (ref 20.0–30.0)
Calcium: 8.9 mg/dL (ref 8.5–10.5)
Chloride: 101 mMol/L (ref 98–110)
Creatinine: 0.65 mg/dL (ref 0.60–1.20)
EGFR: 81 mL/min/{1.73_m2} (ref 60–150)
Glucose: 89 mg/dL (ref 71–99)
Osmolality Calc: 268 mOsm/kg — ABNORMAL LOW (ref 275–300)
Potassium: 3.9 mMol/L (ref 3.5–5.3)
Sodium: 134 mMol/L — ABNORMAL LOW (ref 136–147)

## 2017-03-24 LAB — MAGNESIUM: Magnesium: 1.5 mg/dL — ABNORMAL LOW (ref 1.6–2.6)

## 2017-03-24 MED ORDER — DILTIAZEM HCL ER COATED BEADS 180 MG PO CP24
180.00 mg | ORAL_CAPSULE | Freq: Every day | ORAL | Status: DC
Start: 2017-03-24 — End: 2017-03-24

## 2017-03-24 MED ORDER — MIRTAZAPINE 15 MG PO TABS
7.50 mg | ORAL_TABLET | Freq: Every evening | ORAL | Status: DC
Start: 2017-03-24 — End: 2017-03-25
  Administered 2017-03-24: 07:00:00 7.5 mg via ORAL
  Filled 2017-03-24 (×2): qty 1

## 2017-03-24 MED ORDER — DILTIAZEM HCL 60 MG PO TABS
60.00 mg | ORAL_TABLET | Freq: Four times a day (QID) | ORAL | Status: DC
Start: 2017-03-24 — End: 2017-03-25
  Administered 2017-03-24 – 2017-03-25 (×3): 60 mg via ORAL
  Filled 2017-03-24 (×7): qty 1

## 2017-03-24 NOTE — Plan of Care (Addendum)
Problem: Compromised Hemodynamic Status  Goal: Vital signs and fluid balance maintained/improved  Outcome: Progressing   03/19/17 1827   Goal/Interventions addressed this shift   Vital signs and fluid balance are maintained/improved Monitor/assess vitals and hemodynamic parameters with position changes;Monitor and compare daily weight;Monitor intake and output. Notify LIP if urine output is less than 30 mL/hour.;Monitor/assess lab values and report abnormal values       Problem: Moderate/High Fall Risk Score >5  Goal: Patient will remain free of falls  Outcome: Progressing   03/22/17 1442   OTHER   Moderate Risk (6-13) LOW-Fall Interventions Appropriate for Low Fall Risk;LOW-Anticoagulation education for injury risk;LOW-(VH Only) Continuous video monitoring       Comments: Assumed care of patient at 1900. Patient denies any pain.  A/Ox 2-3. Adequate oxygenation on 3L NC. Cardizem gtt d/c this shift. Tele on patient, HR continues to be 90-120s.  Patient cooperative to take PO medications without any issue. Patient to resume PO Cardizem in the AM. No further needs. Bed alarm on, call bell within reach. Will continue to monitor.

## 2017-03-24 NOTE — PT Progress Note (Signed)
PT Progress Note  VHS: [facility]  Department of Rehabilitation Services: [id number]  AMIYRAH LAMERE    CSN: [id number]    [facility] PULMONARY RENAL UNIT   [id number]    Physical Therapy Treatment Note    Time of treatment:   Time Calculation  PT Received On: 03/24/17  Start Time: 1011  Stop Time: 1034  Time Calculation (min): 23 min    Visit#: 2    Last seen by Physical therapist vs. PTA: 03/24/2017    Medical Diagnosis/Pertinent medical/surgical details: shortness of breath due to right pleural effusion. 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     Precautions and Contraindications:  Falls  Mobility protocol     Assessment:   Patient's progress towards established goals:     Patient made excellent progress towards established goals due to better engagement and motivation in PT session. She required minimal assistance x 1 to transfer in/out of bed and side/forward stepped with front wheeled walker a total of 5 ft with minimal assistance x 2. She required extra time to perform and rest between all functional tasks, but otherwise showed significant improvement in activity tolerance and strength.  She only needed supervision to sit upright on the edge of bed safely.  Will progress towards ambulation goal in following sessions.     PT recommend SNF as follow up to  improve activity tolerance, functional mobility, reduce fall risk, and return home safely      Patient continues to have the following impairments: decreased strength, decreased activity tolerance, decreased functional mobility, decreased balance, impaired cognition    Patient will continue to benefit from skilled PT services in order to  improve activity tolerance, functional mobility, reduce fall risk, and return home safely       Goals:   By D/C  1. Patient will be modified  independent with bed mobility to improve functional mobility and activity tolerance. ongoing  2. Patient will be modified independent with chair/bed transfer using least restrictive assistive device to improve functional mobility and activity tolerance. ongoing  3. Patient will ambulate 200 ft modified independent  assistance using least restrictive assistive device to improve functional mobility and activity tolerance. ongoing       Plan:   Treatment/interventions: Exercise, Gait training, Functional transfer training, LE strengthening/ROM, Cognitive reorientation, Patient/caregiver training, Equipment eval/education, Bed mobility, Compensatory technique education    Treatment Frequency: 4-5x/wk    DISCHARGE RECOMMENDATIONS   DME recommended for Discharge:   TBD at next level of care  Front wheeled walker    Discharge Recommendations:   SNF   Pending medical progress  Pending progress in next therapy session    Subjective:   "I feel pretty good"- when asked after sitting up at edge of bed before ambulating in room   Patient is agreeable to participation in the therapy session. Nursing clears patient for therapy.    Pain:  At Rest: 0 /10  With Activity: 0/10  Location: N/A  Interventions: None required    OBJECTIVE:   Observation of Patient/Vital Signs:   Patient is in bed with telemetry, oxygen at 3 L/min; son present in room  Patient's medical condition is appropriate for Physical therapy intervention at this time.    Vital Signs:  BP Supine:  159//96 mmHg  BP after activity: 142/89 mmHg on left arm (right arm showed 183/115  mmHg)  HR Supine: 121 bpm  HR after activity: 121 bpm  SpO2 at rest: 96%  on supplemental O2    Edema: none  Skin Inspection: appears intact  Sensation: intact , to light touch    Oriented to: Person, Place  and Situation  Command following: Follows 1 step commands without difficulty  Alertness/Arousal: Appropriate responses to stimuli   Attention Span:Appears intact    Musculoskeletal and  Balance Details:   Balance:  Static Sitting:  Fair with upper extremity support  Static Standing:  Fair- with walker  Dynamic Standing:  Fair- with walker          Bed Mobility:   Supine to Sit:   Minimal assist.   Cues for Sequencing., Cues for Hand placement., HOB elevated, Bed rail used, assist at trunk  Sit to Supine:   Minimal assist.   Cues for Sequencing., Cues for Hand placement., HOB flat, Bed rail used, assist at trunk, assist at LE(s)    Transfers:  Sit to Stand:  Minimal assist (assist x2 for safety) with Front wheeled walker.    Cues for Sequencing, Cues for Hand Placement  Stand to Sit:  Minimal assist (assist x2 for safety).    Cues for Sequencing, Cues for Hand Placement    Locomotion:  side stepped and forward stepped a total of 5 ft to demonstrate the ability to stand pivot onto bedside commode and begin ambulation training in follow up session. she required minimal assistance x 2 with front wheeled walker    Participation and Activity Tolerance   Participation effort: Excellent  Activity Tolerance: Tolerates 30 minutes of activity with rest breaks    Other Treatment Interventions this session:   Therapeutic activity     Education Provided:   TOPICS: role of physical therapy, plan of care, goals of therapy and safety with mobility and ADLs, benefits of activity, energy conservation techniques, use of adaptive equipment    Learner educated: Patient, Family  Method: Explanation and Demonstration  Response to education: Verbalized understanding    Patient Position at End of Treatment:   Supine, in bed, in the room, Family/visitors present, Needs in reach, Bed/chair alarm set and No distress    Team Communication:   Spoke to : RN/LPN - Cari  Regarding: Pre-session re: patient status  Whiteboard updated: Yes  PT/PTA communication: via written note and verbal communication as needed.      Recommend patient stand pivot onto bedside commode with assist x 2 and using front wheeled walker outside of PT  sessions.    Tito Dine, PT, DPT

## 2017-03-24 NOTE — Progress Notes (Signed)
03/24/17 1337   CM Review   CM Comments DCP 03/24/17:  Pt not ready for [place] per MD.  Pt has agreed to take po meds after speaking with son. HR still in the 90-120s on 3L of O2.  Pt currently has tele.  Pt from [facility] and plans to return when medically ready for [place].  DCP to follow for changes and updates.        Renda Rolls, BSW  Discharge Planner  [phone]

## 2017-03-24 NOTE — Progress Notes (Signed)
PROGRESS NOTE - [facility]    Date Time: 03/24/17 5:56 PM  Patient Name: Kristina [name]  Attending Physician: Lorrin Goodell, MD    Assessment and Plan                                                       [facility]     Principal Problem:    Pleural effusion, right  Active Problems:    PAF (paroxysmal atrial fibrillation)    HTN (hypertension)    Hypothyroidism    Chronic systolic (congestive) heart failure    Cerebrovascular accident (CVA) with intracranial hemorrhage    Pleural effusion due to congestive heart failure  Resolved Problems:    * No resolved hospital problems. *    Pleural effusion, right:Likely parapneumonic effusion with no mass seen on the CT 02/20/2017  -s/p Thoracentesis , fluid Ldh seems high,?exudate  -ECHO showing both LV failure EF 40% and RV failure. The patient also has severe tricuspid regurgitation  -IV lasix 40mg   blood cx-no growth  Pleural fluid cx-no growth so far  Discontinued abx,white count 12,000,afebrile    Cerebral infarction with hemorrhagic conversion   -Repeat CT shows mild hypodensity Left frontal cortical region where a  prior hemorrhage was present, a left frontal extra-axial calcified nodule measuring 7 mm, presumed small calcified meningioma.  -MRI on 12/13 was acute cortical infarction of the left operculofrontal region with hemorrhagic features without significant mass effect    Right/Left Congestive Heart Failure  -Very significant right atrium and right ventricular enlargement with signs of right heart failure (CT 12/6)  -ECHO 12/6 showing both LV failure EF 40% and RV failure with severe tricuspid regurgitation   continue lasix    PAF (paroxysmal atrial fibrillation) with RVR  HR in 120s  -resume Cardizem po, Rheems cardizem infusion 5mg /hr later today  Troponin,0.01,Ekg reviewed  -No anticoagulation at this time, due for repeat CT of head and neck to reevaluate (per Dr. Nunzio Cory on previous admission)  bmp,magnesium in am    HTN  (hypertension)  -Continue home medications,     HLD  -Cont atorvasatain    Hypothyroidism  tsh wnl,continue levothyroxin qd    Rheumatoid Arthritis  -Methotrexate    Depression  Psychiatry rec. Remeron,due to sleepiness dose reduced to 7.5mg  daily    DVT Prophylaxis:Hx of recent GI Bleed, and hemorrhagic conversion  Diet: Regular    Code Status:DNR  PT eval.,apparently patient refused     Patient was not depressed and overwhelmed with her medical conditions initially and did not want to take po meds, was requesting for withdrawal of care ,she was adamant about  not taking any more po  medications and clearly stated she does not want any further treatment,since patient is depressed and quite emotional to make this decision  I have discussed  above with the family who have requested palliative care consult at  this time,and did not want hospice yet,however after speaking to her son who is visiting her from out of town she agreed to continue with treatment but does not want any aggressive Rx.  Psychiatry consulted to determine her capacity to make decisions regarding her further health care needs.  D/w Dr.Nardellli.her depression seem to be influencing her judgment at this time.  Also  consulted Palliative care.  Antibiotics are discontinued as cultures  remained negative  Discontinue cardizem drip  cardizem 60mg  mg po q6hrs   Disposition: in 1-2days to [place]   If any further decline in her condition patient and family wants comfort measures/Hospice.  D/w son today  Subjective                                                                          [facility]        stable,no acute complaints,feels weak,depressed,feeling better today  Family at bed side,as per son after he spoke to her she is willing to take po meds but does not want any aggressive Rx.  Tachycardia   Review Of Systems:                                                                          [facility] Hospitalists    negative    Physical Exam:                                                                    [facility] Hospital[facility]     Temp:  [97.5 F (36.4 C)-99 F (37.2 C)] 99 F (37.2 C)  Heart Rate:  [93-121] 118  Resp Rate:  [16-24] 20  BP: (127-150)/(76-94) 137/76    Intake/Output Summary (Last 24 hours) at 03/24/17 1756  Last data filed at 03/24/17 1439   Gross per 24 hour   Intake              580 ml   Output                0 ml   Net              580 ml       General: A/A/O, ; no acute distress,fatigued,gen.weakness  HEENT: perrla, eomi, sclera anicteric  oropharynx clear without lesions, mucous membranes moist  Glands: No cervical or axillary lymphadenopathy.  Neck: supple, no lymphadenopathy, no thyromegaly, no JVD, no carotid bruits  Cardiovascular: Irregular rhythm, tachycardia ,no rubs or gallops  Lungs: clear to auscultation bilaterally, without wheezing, rhonchi, or rales  Abdomen: soft, non-tender, non-distended; no palpable masses, no hepatosplenomegaly, normoactive bowel sounds, no rebound or guarding  Extremities: no clubbing, cyanosis, or edema  Neuro: cranial nerves grossly intact, strength 2//5 in upper and lower extremities, sensation intact, depressed  Psych: Normal affect, not depressed     Meds:  [facility] Hospitalists       Current Facility-Administered Medications   Medication Dose Route Frequency Provider Last Rate Last Dose   . acetaminophen (TYLENOL) tablet 650 mg  650 mg Oral Q4H PRN Hwig, Nauras, MD        Or   . acetaminophen (TYLENOL) 160 MG/5ML oral solution 650 mg  650 mg per NG tube Q4H PRN Hwig, Nauras, MD        Or   . acetaminophen (TYLENOL) suppository 650 mg  650 mg Rectal Q4H PRN Hwig, Nauras, MD       . albuterol (PROVENTIL HFA;VENTOLIN HFA) inhaler 2 puff  2 puff Inhalation BID PRN Hwig, Nauras, MD       . aspirin chewable tablet 81 mg  81 mg Oral Daily Hwig, Nauras, MD   81 mg at 03/24/17 0910   .  atorvastatin (LIPITOR) tablet 80 mg  80 mg Oral QHS Hwig, Nauras, MD   80 mg at 03/23/17 2236   . dilTIAZem (CARDIZEM CD) 24 hr capsule 240 mg  240 mg Oral QAM Hwig, Nauras, MD   240 mg at 03/24/17 0910   . dilTIAZem (CARDIZEM) 100 mg in dextrose 5 % 100 mL infusion  5 mg/hr Intravenous Continuous Lorrin Goodell, MD   Stopped at 03/24/17 0144   . docusate sodium (COLACE) capsule 100 mg  100 mg Oral QD PRN Lorrin Goodell, MD       . furosemide (LASIX) injection 40 mg  40 mg Intravenous Once Hwig, Nauras, MD       . furosemide (LASIX) tablet 40 mg  40 mg Oral Daily Lorrin Goodell, MD   40 mg at 03/24/17 0910   . lactobacillus species (BIO-K PLUS) capsule 50 Billion CFU  50 Billion CFU Oral Daily Hwig, Nauras, MD   50 Billion CFU at 03/24/17 0910   . latanoprost (XALATAN) 0.005 % ophthalmic solution 1 drop  1 drop Both Eyes QHS Hwig, Nauras, MD   1 drop at 03/23/17 2200   . levothyroxine (SYNTHROID, LEVOTHROID) tablet 112 mcg  112 mcg Oral QAM Hwig, Nauras, MD   112 mcg at 03/24/17 0910   . methotrexate tablet 10 mg  10 mg Oral Q MONDAY (PM) Hwig, Nauras, MD   10 mg at 03/24/17 1753   . mirtazapine (REMERON) tablet 7.5 mg  7.5 mg Oral QHS Shearrow, Franki Cabot, NP       . naloxone [facility]) injection 0.4 mg  0.4 mg Intravenous PRN Hwig, Nauras, MD       . nitroglycerin (NITROSTAT) SL tablet 0.4 mg  0.4 mg Sublingual Q5 Min PRN Hwig, Nauras, MD       . potassium chloride (K-DUR,KLOR-CON) CR tablet 10 mEq  10 mEq Oral Daily Lorrin Goodell, MD       . senna-docusate (PERICOLACE) 8.6-50 MG per tablet 2 tablet  2 tablet Oral QHS Hwig, Nauras, MD       . sodium chloride (PF) 0.9 % injection 3 mL  3 mL Intravenous Q8H Hwig, Nauras, MD   3 mL at 03/24/17 1353       Labs and Imaging:                                                              [facility]  RECENT LABS (from the last 7 days)    Recent Labs  Lab 03/20/17  0813 03/19/17  0112   WBC 12.1* 11.8*   RBC 4.07 4.13   Hemoglobin 14.2 14.6   Hematocrit 41.9  42.4   MCV 103* 103*   PLT CT 227 262       Recent Labs  Lab 03/19/17  0112   PT 11.9*   PT INR 1.2       Recent Labs  Lab 03/21/17  0923 03/19/17  0414 03/19/17  0112   Troponin I 0.01 0.02 0.02      Recent Labs  Lab 03/24/17  0644 03/20/17  1836 03/20/17  0813   Glucose 89  --  110*   Sodium 134*  --  133*   Potassium 3.9 3.4* 2.5*   Chloride 101  --  97*   CO2 24.9  --  23.5   BUN 13  --  11   Creatinine 0.65  --  0.68   EGFR 81  --  80   Calcium 8.9  --  8.7       Recent Labs  Lab 03/24/17  0644   Magnesium 1.5*       Recent Labs  Lab 03/19/17  0414 03/19/17  0112   Albumin  --  3.4*   Protein, Total 5.4* 6.6   Bilirubin, Total  --  0.8   Alkaline Phosphatase  --  119   ALT  --  21   AST (SGOT)  --  24      Recent Labs  Lab 03/20/17  0233   Specific Gravity, UR 1.006   pH, Urine 7.0   Protein, UR Negative   Glucose, UA Negative   Ketones UA Negative   Bilirubin, UA Negative   Blood, UA Negative   Nitrite, UA Negative   Urobilinogen, UA Normal   Leukocyte Esterase, UA 25*   WBC, UA 2   RBC, UA 1   Bacteria, UA Rare*      Lab Results   Component Value Date    HGBA1CPERCNT 5.7 02/24/2017            Microbiology, reviewed and are significant for:  Microbiology Results     Procedure Component Value Units Date/Time    Blood Culture - Venipuncture # 1 [[id number]] Collected:  03/19/17 0144    Specimen:  Blood from Venipuncture Updated:  03/19/17 0617    Narrative:       Specimen/Source: Blood/Venipuncture  Collected: 03/19/2017 01:44     Status: Valued      Last Updated: 03/19/2017 06:15                Culture Result (Prelim)      Culture In Progress          Influenza A / B Rapid Test [[id number]] Collected:  03/19/17 0114    Specimen:  Nasal Wash Updated:  03/19/17 0141     Influenza A Negative     Influenza B Negative     Comment: Method: Jarvis Morgan    The sensitivity for this method is between 90% and 95% for Influenza A and around 90% for Influenza B. The specificity for both Influenza A and B is around 96%. False  positive results may occur, especially when the prevalence of Influenza activity is low. This is more likely with Influenza B due to its lower prevalence. Clinical conditions, including the prevalence of influenza activity, should be considered in the  interpretation of results. If clinically indicated, results may be confirmed with PCR testing.  The above 2 analytes were performed by [facility] Main Lab [phone])  [address] [address]         Narrative:       Influenza A antigen detection tests are unable to distinquish between novel and seasonal influenza A.    A negative result for either Influenza A or B antigen does not exclude influenza virus infection. Clinical correlation required.    All positive influenza antigen tests (A or B) require placement of patient on droplet precaution isolation.    Sterile Body Fluid Culture and Smear [[id number]] Collected:  03/19/17 0945    Specimen:  Amniotic from Pleural Fluid Updated:  03/19/17 1538    Narrative:       Specimen/Source: Amniotic/Pleural Fluid  Collected: 03/19/2017 09:45     Status: Valued      Last Updated: 03/19/2017 15:38                Gram Stain (Final)      No Organisms Seen      Few WBC's Seen      Specimen performed by cytocentrifugation methodology                Imaging:  Ct Angiogram Head    Result Date: 02/25/2017  Normal CTA head. ReadingStation:[id number]    Xr Chest Pa Or Ap    Result Date: 03/19/2017  No complication after right thoracentesis. ReadingStation:[email or web address]    Xr Knee 1 Or 2 Views Right    Result Date: 02/26/2017  There is a large joint effusion and linear sclerosis along the lateral aspect of the proximal tibial metaphysis which is questionable for a minimally impacted fracture. Further evaluation with cross-sectional imaging should be considered when clinically feasible. MRI would be the modality of choice as it would allow for further characterization of the calcifications along the lateral joint line  which could be secondary to atherosclerosis or a meniscal tear with a small amount of extruded meniscal tissue. ReadingStation:[email or web address]    Xr Ankle Left Ap And Lateral    Result Date: 03/03/2017  Nonspecific soft tissue swelling, and degenerative changes. Vascular calcification noted. Chronic degenerative changes. Lucency of navicular bone nonspecific but could represent chronic osteomyelitis. If there is clinical concern, MRI would be modality of choice. ReadingStation:[email or web address]    Ct Head Wo Contrast    Result Date: 03/19/2017  Abnormal CT scan of the head demonstrating an area of hypodensity in the left frontal cortical region where a previous hemorrhagic infarction has been described. The area is smaller in size versus the prior study. Serial imaging for this finding is reasonable. There remains a left frontal calcified meningioma, stable. Slight stable subcortical microvascular disease changes. Mild/moderate generalized brain volume loss with mild widening of the cerebellum that may be compatible with age to excessive. ReadingStation:[email or web address]    Ct Head Wo Contrast    Result Date: 02/24/2017  Subarachnoid hemorrhage in left frontal lobe, with surrounding vasogenic edema, loss of gray-white matter differentiation, mild effacement of left lateral ventricle, and 3 mm left-to-right midline shift. This mostly involves the region of Broca's area, which may explain the patient's difficulty with speech articulation. An underlying mass cannot be excluded. A stat MRI of the brain with contrast is recommended for further characterization. Findings were discussed with Dr. Nunzio Cory in the emergency room at 12:50pm on the 10th of December, 2018. ReadingStation:[email or web address]    Ct Angiogram Neck  Result Date: 02/25/2017  Abnormal CT angiogram of the neck demonstrating: 1. On the right the internal carotid is moderate stenosis of approximately 60%. The right vertebral artery is patent. 2. On the left there is mild narrowing the  proximal internal carotid artery. The left vertebral artery is patent. ReadingStation:[id number]    Ct Knee Right Wo Contrast    Result Date: 02/26/2017  1.  Osteoporosis, marked cartilage loss in the lateral compartment, and chronic-appearing trabecular impaction lateral tibial plateau without lucent fracture line or depression. 2.  Large effusion and popliteal cyst. Chondrocalcinosis may be related to underlying hypercalcemia or CPPD.  ReadingStation:[email or web address]    Mri Brain W Wo Contrast    Result Date: 02/24/2017  Abnormal MRI scan of the head demonstrating: 1. There is an acute cortical infarction of the left operculofrontal region with hemorrhagic features without significant mass effect 2. There is a chronic microhemorrhage in the left thalamus 3. Moderate cortical and subcortical atrophy. 4. Mild chronic microvascular ischemic disease. ReadingStation:[id number]    Xr Chest Ap Portable    Result Date: 03/19/2017  Stable right pleural effusion and right basilar atelectasis. Stable cardiomegaly. ReadingStation:[id number]    Xr Chest Ap Portable    Result Date: 03/03/2017  IMPRESSION: Recurrent moderate right pleural effusion with middle and lower lobe atelectasis. Correlate clinically to exclude pneumonia. ReadingStation:[id number]    US Thoracentesis    Result Date: 03/19/2017  Successful ultrasound-guided right thoracentesis with removal of 1.2 L of clear right pleural fluid. Fluid sent for the requested laboratory evaluation. ReadingStation:[email or web address]          Disposition:                                                                       [facility]            Today's date: 03/24/2017  Length of Stay: 5    Signed by: Lorrin Goodell, MD  Pager # [phone]    Total time spent with the patient/family, counseling and/or coordination of care with other physicians, other health care professionals is Time spent:::"35" minutes"   I have addressed the principle problem (listed above), the active hospital  problems (listed above) and the results of laboratory tests.   Greater than 50% of the time was spent counseling and coordinating care.    [employer], [place]  [address]  [place], [id number]

## 2017-03-24 NOTE — Consults (Signed)
[facility] Palliative Care Service  Initial Comprehensive Assessment  Date, Time: 03/24/17 2:54 PM  Patient Name: Kristina [name]  Referring Physician:  Lorrin Goodell, MD   Primary Care Physician: Ellender Hose, MD  Consulting Team: Dolly Rias, MD; Lauretta Grill, NP; Carolin Coy, LCSW  Consulting Service: Palliative Medicine  Reason for Consult: communication/transitions related to multiple co-morbidities, progressing declining condition.  Palliative care is a specialized, interdisciplinary approach to improving comfort and quality of life at any stage of a serious illness by addressing symptoms, communication, and transitions.        Assessment & Recommendations     Impression / Assessment:  1. HF with borderline EF 40% and recurrent pleural effusion  2. S/p CVA - hemorrhagic conversion  3. Atrial Fib with RVR  4. Depression    Recommendations:  Agree with attempts to relieve burden of depression.    Discussed with Dr. Anna Genre, including; Remeron dosage adjustments to reduce sedation which is concerning for the family.     May benefit from Integrative Medicine for Reiki, Music Therapy or Pet Therapy.      Goals of care: The patient wants highest priority given to comfort, as opposed to independence (function) or longevity.     Consults:  Referral placed to Integrative Medicine    Current CPR Status:  NO CPR  -  ALLOW NATURAL DEATH   Needs/wants further discussion of advance directives/code status:  []  Yes [x]  No  Patient has completed a Durable Do Not Resuscitate document.      Disposition / Further attention / Follow-up: Patient/family wishes to update Physician Orders for Scope of Treatment.  Palliative Care continuing to follow.                         Discussed with: Dr. Anna Genre, Dr. Blake Divine, Son-[name] and patient.    Thank you, Dr. Blake Divine, for this consult. Please do not hesitate to contact the Palliative Care Service if you have questions about the above recommendations.      Joycelyn Das, NP    Palliative Care Service, [address], [address]  [address] [facility]  [place], [place] [address]  [phone]       History of Present Illness     CC: clarification of goals of care    Kristina [name] is an 84 y.o. female admitted 03/19/2017 from [facility] with shortness of breath and chest pain.  She has a history of atrial fib-no longer on anticoagulation, HF with EF of 40%, hypothyroidism, HTN, RV and LV failure, moderate tricuspid regurg, and s/p CVA-hemorrhagic.  She had a previous admission for 8 days in December for CVA due to intracranial hemorrhage, and was discharged to [facility] for rehab.    During this current admission she has undergone a thoracentesis with removal of 1200ccs.  SLP performed a bedside swallow evaluation and found mild oropharyngeal dysphagia with residual Broca's Aphasia.  Required cardizem infusion for afib with RVR, it was discontinued today and she has been transitioned to the oral form.    According to her medical team and son-[name] she has been depressed, intermittently tearful, refusing to take her medications and stated that she was "ready to go".  Her husband died approximately 3 years ago and his birthday is approaching on January 10th, and she is continuing to grieve.    Met with Kristina [name] and her son-[name] at bedside.      Patient appears chronically ill and  debilitated with intermittent memory loss and inability to think of the words she wishes to say.  She denies pain or discomfort, respirations easy and non-labored at rest.  She does report shortness of breath with exertion and is wearing oxygen via nasal cannula continuously.  She also reports profound fatigue and weakness.  Patient needs encouragement to participate in conversation and tries to answer questions asked.  She is unable to explain her understanding of her current illness.  In October she spent time with her son in [place] and flew by airplane on her own back to  [place].  She moved from [place] to [place] with her daughter-Kristina [name] a few years ago after her husband died.  She and her son described her as being able to take care of herself, and help around her daughter's house until December when she developed a GI Bleed, and followed by another admission for a CVA with transfer to rehab at [facility]. Overall her performance status has declined and has been compounded by depression.    [name] clearly stated that her goal is to be comfortable with focus on quality of life rather than longevity or function/independence.  Based upon her multiple co-morbidities, 3 hospitalizations within a month, inability to recover to previous level of function, and generalized weakness/fatigue she is a candidate for hospice support.    Discussed obtaining a hospice informational visit with son and he is in agreement.  Will pursue this discussion with patient during follow up visit.    ADLs prior to admission:    Independent Needs assistance Dependent   Ambulation []   [x]  []    Transferring []  [x]  []    Dressing []  [x]  []    Bathing []  [x]  []    Toileting []  [x]  []    Feeding []  [x]  []      Advance Care Planning:  Advance Directives:    Has: [x]  Yes []  No  Son states that he knows his mother completed an advanced directive they are having difficulty locating it.  Discussed: [x]  Yes []  No   Health-care power of attorney: children are her next of kin and are her health care agents.     A MOST (Medical Orders for the Scope of Treatment) form was completed 12/24/2016. Son indicates that he would like to discuss updating the form with his mother.    Review of Systems     Review of Systems   Constitutional: Positive for malaise/fatigue.   Eyes:        Wears glasses.   Respiratory: Positive for shortness of breath. Negative for cough and wheezing.    Cardiovascular: Positive for orthopnea. Negative for chest pain and leg swelling.   Gastrointestinal: Negative for abdominal pain,  constipation, diarrhea, nausea and vomiting.   Genitourinary: Negative.    Musculoskeletal: Negative.    Neurological: Positive for weakness.   Psychiatric/Behavioral: Positive for depression and memory loss.        Past Medical and Surgical History     Past Medical History:   Diagnosis Date   . A-fib    . Aphasia    . Bradycardia    . Cerebral infarction    . Chest pain    . Congestive heart failure    . Diarrhea    . Disorder of thyroid     hypo   . Hyperlipidemia    . Hypertension    . Melanoma    . Rheumatoid arthritis    . Vitamin D deficiency  Past Surgical History:   Procedure Laterality Date   . arm surgery      roght   . HIP SURGERY      left       Social History     History   Smoking Status   . Never Smoker   Smokeless Tobacco   . Never Used     History   Alcohol Use No     History   Drug Use No     Marital Status: widow x 3 years  Lives with: Recently from [place] but had been living with her daughter-Kristina [name]  Family: 4 children (3 sons and 1 daughter), 9 grandchildren and 1 great grandchild  Occupation: homemaker    Spirituality (faith & importance, community, assessment/issues): [place], but does not attend regular church services.  She does not verbalize any spiritual concerns.  Someone from [place] has been supporting her and has visited during this hospitalization.    What gives meaning to the patient? Currently she does not express interest in anything. She use to enjoy arts/crafts; weaving, sewing and making baskets.  She traveled the [place] with her husband in their RV.     Emergency contact listed: Extended Emergency Contact Information  Primary Emergency Contact: [facility]  Address: [address]           [place], [place] [address] [place]  Home Phone: [phone]  Work Phone: [phone]  Relation: Daughter  Secondary Emergency Contact: Elmarie [place]  Home Phone: [phone]  Mobile Phone:  [phone]  Relation: Son                                      Family History     History reviewed. No pertinent family history.    Medications     Medications:     Current Facility-Administered Medications   Medication Dose Route Frequency   . aspirin  81 mg Oral Daily   . atorvastatin  80 mg Oral QHS   . dilTIAZem  240 mg Oral QAM   . furosemide  40 mg Intravenous Once   . furosemide  40 mg Oral Daily   . lactobacillus species  50 Billion CFU Oral Daily   . latanoprost  1 drop Both Eyes QHS   . levothyroxine  112 mcg Oral QAM   . methotrexate  10 mg Oral Q MONDAY (PM)   . mirtazapine  15 mg Oral QHS   . potassium chloride  10 mEq Oral Daily   . senna-docusate  2 tablet Oral QHS   . sodium chloride (PF)  3 mL Intravenous Q8H     acetaminophen **OR** acetaminophen **OR** acetaminophen, albuterol, docusate sodium, naloxone, nitroglycerin    Allergies     Allergies   Allergen Reactions   . Lisinopril Anaphylaxis   . Gluten Meal        Physical Exam     BP 127/90   Pulse 93   Temp 97.5 F (36.4 C) (Oral)   Resp 18   Ht 1.6 m (5\' 3" )   Wt 57.2 kg (126 lb)   SpO2 99%   BMI 22.32 kg/m     Physical Exam   Constitutional: She is oriented to person, place, and time.   Chronically ill and debilitated senior adult woman in no acute distress.   HENT:   Head: Normocephalic  and atraumatic.   Eyes: Pupils are equal, round, and reactive to light.   Neck: Normal range of motion.   Cardiovascular: Normal rate and intact distal pulses.    Murmur heard.  Heart tones pounding and irregular.   Pulmonary/Chest: Effort normal. No respiratory distress.   Clear in the upper lobes, diminished in the bases.   Abdominal: Soft. Bowel sounds are normal. She exhibits no distension. There is no tenderness.   Genitourinary:   Genitourinary Comments: Continent   Musculoskeletal:   Mild non-pitting edema of bilateral lower extremities left >right.     Neurological: She is alert and oriented to person, place, and time.   Has difficulty  finding the words she wants to say.  Able to participate in conversation.  Hand grasps equal and weak. Pedal pushes and pulls; right-strong, left weak.  Left foot droopy and adducted.     Skin: Skin is warm and dry. There is pallor.   Poor turgor and fragile.   Psychiatric:   Low spirits, flat affect.   Nursing note and vitals reviewed.     PPS: 30-40%    Labs / Radiology     Lab and diagnostics: reviewed by me.    Recent Labs  Lab 03/20/17  0813   WBC 12.1*   Hemoglobin 14.2   Hematocrit 41.9   PLT CT 227        Recent Labs  Lab 03/24/17  0644   Sodium 134*   Potassium 3.9   Chloride 101   CO2 24.9   BUN 13   Creatinine 0.65   EGFR 81   Glucose 89   Calcium 8.9        Recent Labs  Lab 03/19/17  0414 03/19/17  0112   Bilirubin, Total  --  0.8   Protein, Total 5.4* 6.6   Albumin  --  3.4*   ALT  --  21   AST (SGOT)  --  24          Eval / Mgmt / Counseling Time        I have spent 83 minutes with the patient and/or family members as well as care team members, discussing palliative care concepts, hospice care and/or referral, advance directives (including resuscitation options/choices), medications (current or proposed therapies and side effects), the principle problem (listed above), the active hospital problems (listed above) and discharge planning issues.  More than 50% of this time was spent counseling and coordinating care.  2:54pm - 4:17pm

## 2017-03-24 NOTE — Plan of Care (Signed)
Problem: Inadequate Gas Exchange  Goal: Adequate oxygenation and improved ventilation  Outcome: Progressing   03/19/17 2343   Goal/Interventions addressed this shift   Adequate oxygenation and improved ventilation Assess lung sounds;Monitor SpO2 and treat as needed;Monitor and treat ETCO2;Provide mechanical and oxygen support to facilitate gas exchange;Position for maximum ventilatory efficiency;Plan activities to conserve energy: plan rest periods;Increase activity as tolerated/progressive mobility;Consult/collaborate with Respiratory Therapy;Teach/reinforce use of incentive spirometer 10 times per hour while awake, cough and deep breath as needed

## 2017-03-25 DIAGNOSIS — M542 Cervicalgia: Secondary | ICD-10-CM | POA: Diagnosis not present

## 2017-03-25 DIAGNOSIS — I69820 Aphasia following other cerebrovascular disease: Secondary | ICD-10-CM | POA: Diagnosis not present

## 2017-03-25 DIAGNOSIS — M79673 Pain in unspecified foot: Secondary | ICD-10-CM | POA: Diagnosis not present

## 2017-03-25 DIAGNOSIS — M069 Rheumatoid arthritis, unspecified: Secondary | ICD-10-CM | POA: Diagnosis not present

## 2017-03-25 DIAGNOSIS — I639 Cerebral infarction, unspecified: Secondary | ICD-10-CM | POA: Diagnosis not present

## 2017-03-25 DIAGNOSIS — E785 Hyperlipidemia, unspecified: Secondary | ICD-10-CM | POA: Diagnosis not present

## 2017-03-25 DIAGNOSIS — J069 Acute upper respiratory infection, unspecified: Secondary | ICD-10-CM | POA: Diagnosis not present

## 2017-03-25 DIAGNOSIS — B351 Tinea unguium: Secondary | ICD-10-CM | POA: Diagnosis not present

## 2017-03-25 DIAGNOSIS — I6930 Unspecified sequelae of cerebral infarction: Secondary | ICD-10-CM | POA: Diagnosis not present

## 2017-03-25 DIAGNOSIS — D849 Immunodeficiency, unspecified: Secondary | ICD-10-CM | POA: Diagnosis not present

## 2017-03-25 DIAGNOSIS — E039 Hypothyroidism, unspecified: Secondary | ICD-10-CM | POA: Diagnosis not present

## 2017-03-25 DIAGNOSIS — L603 Nail dystrophy: Secondary | ICD-10-CM | POA: Diagnosis not present

## 2017-03-25 DIAGNOSIS — R2681 Unsteadiness on feet: Secondary | ICD-10-CM | POA: Diagnosis not present

## 2017-03-25 DIAGNOSIS — I739 Peripheral vascular disease, unspecified: Secondary | ICD-10-CM | POA: Diagnosis not present

## 2017-03-25 DIAGNOSIS — R531 Weakness: Secondary | ICD-10-CM | POA: Diagnosis not present

## 2017-03-25 DIAGNOSIS — J90 Pleural effusion, not elsewhere classified: Secondary | ICD-10-CM | POA: Diagnosis not present

## 2017-03-25 DIAGNOSIS — J9811 Atelectasis: Secondary | ICD-10-CM | POA: Diagnosis not present

## 2017-03-25 DIAGNOSIS — J918 Pleural effusion in other conditions classified elsewhere: Secondary | ICD-10-CM | POA: Diagnosis not present

## 2017-03-25 DIAGNOSIS — R509 Fever, unspecified: Secondary | ICD-10-CM | POA: Diagnosis not present

## 2017-03-25 DIAGNOSIS — M6281 Muscle weakness (generalized): Secondary | ICD-10-CM | POA: Diagnosis not present

## 2017-03-25 DIAGNOSIS — I509 Heart failure, unspecified: Secondary | ICD-10-CM | POA: Diagnosis not present

## 2017-03-25 DIAGNOSIS — J181 Lobar pneumonia, unspecified organism: Secondary | ICD-10-CM | POA: Diagnosis not present

## 2017-03-25 DIAGNOSIS — M25472 Effusion, left ankle: Secondary | ICD-10-CM | POA: Diagnosis not present

## 2017-03-25 DIAGNOSIS — R609 Edema, unspecified: Secondary | ICD-10-CM | POA: Diagnosis not present

## 2017-03-25 DIAGNOSIS — I480 Paroxysmal atrial fibrillation: Secondary | ICD-10-CM | POA: Diagnosis not present

## 2017-03-25 DIAGNOSIS — I5020 Unspecified systolic (congestive) heart failure: Secondary | ICD-10-CM | POA: Diagnosis not present

## 2017-03-25 DIAGNOSIS — I4891 Unspecified atrial fibrillation: Secondary | ICD-10-CM | POA: Diagnosis not present

## 2017-03-25 MED ORDER — MAGNESIUM OXIDE 400 MG TABS (WRAP)
400.00 mg | ORAL_TABLET | Freq: Every day | ORAL | 0 refills | Status: DC
Start: 2017-03-25 — End: 2017-08-16

## 2017-03-25 MED ORDER — TRAMADOL HCL 50 MG PO TABS
50.00 mg | ORAL_TABLET | Freq: Four times a day (QID) | ORAL | 0 refills | Status: DC | PRN
Start: 2017-03-25 — End: 2017-06-05

## 2017-03-25 MED ORDER — MIRTAZAPINE 7.5 MG PO TABS
7.50 mg | ORAL_TABLET | Freq: Every evening | ORAL | Status: DC
Start: 2017-03-25 — End: 2017-08-16

## 2017-03-25 MED ORDER — FUROSEMIDE 40 MG PO TABS
40.00 mg | ORAL_TABLET | Freq: Every day | ORAL | Status: DC
Start: 2017-03-25 — End: 2017-09-05

## 2017-03-25 NOTE — Plan of Care (Signed)
Problem: Inadequate Gas Exchange  Goal: Adequate oxygenation and improved ventilation  Outcome: Progressing   03/25/17 3875   Goal/Interventions addressed this shift   Adequate oxygenation and improved ventilation Assess lung sounds;Monitor SpO2 and treat as needed;Monitor and treat ETCO2;Provide mechanical and oxygen support to facilitate gas exchange;Plan activities to conserve energy: plan rest periods;Increase activity as tolerated/progressive mobility;Consult/collaborate with Respiratory Therapy

## 2017-03-25 NOTE — Progress Notes (Signed)
03/25/17 1322   CM Review   CM Comments DCP 03/25/17:  Pt to [place] to [facility] via VMT wc Zenaida Niece at 15:00.  MD, Nurse and son Jaelin Devincentis (at bedside) aware of [place] plans.   Call report to [phone].         [facility]   [address]   [place] [address]     Case Manager    Patient identified as a Moderate Risk for readmission based on the current risk score  Estimated D/C Date: 03/25/17   Risk Score:    RX Coverage:   Yes   Utilize D/C Medication Program: No   Confirmed PCP with patient: name/last visit: Noel Christmas   Confirmed Transportation to F/u Appt: Yes   PCP F/U Appt request is placed in the Navigator: Yes   Anticipated DME needed for D/C: None   Anticipated HH, arrange if appropriate: None   Anticipated Placement:  Referral to DCP: Pt to [place] to [facility] for IP rehab   Inpatient Plan of Care:    Pts hospital issues resolved will need rehab   CM Interventions:    DCP updated MD and Nurse of [place] plans.  Arranged VMT wc van for 15:00.        Renda Rolls, BSW  Discharge Planner  [phone]

## 2017-03-25 NOTE — Plan of Care (Signed)
Problem: Inadequate Gas Exchange  Goal: Adequate oxygenation and improved ventilation  Outcome: Progressing  Patient alert and cooperative took medication without incident

## 2017-03-25 NOTE — Plan of Care (Signed)
Problem: Compromised Hemodynamic Status  Goal: Vital signs and fluid balance maintained/improved  Outcome: Progressing   03/19/17 1827   Goal/Interventions addressed this shift   Vital signs and fluid balance are maintained/improved Monitor/assess vitals and hemodynamic parameters with position changes;Monitor and compare daily weight;Monitor intake and output. Notify LIP if urine output is less than 30 mL/hour.;Monitor/assess lab values and report abnormal values       Problem: Inadequate Airway Clearance  Goal: Normal respiratory rate/effort achieved/maintained  Outcome: Progressing   03/23/17 1116   Goal/Interventions addressed this shift   Normal respiratory rate/effort achieved/maintained Plan activities to conserve energy: plan rest periods

## 2017-03-25 NOTE — Discharge Summary (Signed)
DISCHARGE SUMMARY - [facility]    Patient Name: Kristina [name]  Attending Physician: Donnie Coffin, DO  Primary Care Physician: Ellender Hose, MD    Date of Admission: 03/19/2017  Date of Discharge: 03/25/2017  Length of Stay in the Hospital: 6    Discharge Diagnoses:                                                                      [facility]     Pleural effusion resolved. S/p thoracentesis  Cerebral infarction with hemorrhagic conversation  afib rvr resolved    Discharge Condition: fair    Discharge Instructions:                                                                   [facility]      Disposition:  SNF    Diet: gluten restricted    Activity: As tolerated and May resume normal activities    Discharge Code Status: NO CPR  -  ALLOW NATURAL DEATH    Patient was instructed to follow up with:   Ellender Hose, MD  [address]  [place] [address]  [phone]    Follow up in 1 week(s)        Complete instructions are in the patient's After Visit Summary (AVS).    Discharge Medications:                                                                    [facility]        Medication List      START taking these medications    magnesium oxide 400 MG tablet  Commonly known as:  MAG-OX  Take 1 tablet (400 mg total) by mouth daily.     mirtazapine 7.5 MG tablet  Commonly known as:  REMERON  Take 1 tablet (7.5 mg total) by mouth nightly.        CHANGE how you take these medications    furosemide 40 MG tablet  Commonly known as:  LASIX  Take 1 tablet (40 mg total) by mouth daily.  What changed:   medication strength   how much to take        CONTINUE taking these medications    acetaminophen 325 MG tablet  Commonly known as:  TYLENOL     aspirin 81 MG chewable tablet  Chew 1 tablet (81 mg total) by mouth daily.     atorvastatin 80 MG tablet  Commonly known as:  LIPITOR  Take 1 tablet (80 mg total) by mouth nightly.     CALMOSEPTINE 0.44-20.6 % Oint  ointment  Generic drug:  Menthol-Zinc Oxide     dilTIAZem 240 MG 24 hr capsule  Commonly known as:  CARDIZEM CD  fluticasone 50 MCG/ACT nasal spray  Commonly known as:  FLONASE     folic acid 1 MG tablet  Commonly known as:  FOLVITE     lactase 3000 units tablet  Commonly known as:  LACTAID     latanoprost 0.005 % ophthalmic solution  Commonly known as:  XALATAN     levothyroxine 112 MCG tablet  Commonly known as:  SYNTHROID, LEVOTHROID     loperamide 2 MG capsule  Commonly known as:  IMODIUM     LUMIGAN 0.01 % Soln  Generic drug:  bimatoprost     methotrexate 2.5 MG tablet     nitroglycerin 0.4 MG SL tablet  Commonly known as:  NITROSTAT     potassium chloride 10 MEQ CR capsule  Commonly known as:  MICRO-K     PROAIR HFA 108 (90 Base) MCG/ACT inhaler  Generic drug:  albuterol     SIMBRINZA 1-0.2 % Susp  Generic drug:  Brinzolamide-Brimonidine     traMADol 50 MG tablet  Commonly known as:  ULTRAM  Take 1 tablet (50 mg total) by mouth every 6 (six) hours as needed for Pain.        STOP taking these medications    predniSONE 20 MG tablet  Commonly known as:  DELTASONE        ASK your doctor about these medications    lactobacillus species capsule  Commonly known as:  BIO-K PLUS  Take 1 capsule (50 Billion CFU total) by mouth daily.  Ask about: Should I take this medication?           Where to Get Your Medications      You can get these medications from any pharmacy    Bring a paper prescription for each of these medications   traMADol 50 MG tablet     Information about where to get these medications is not yet available    Ask your nurse or doctor about these medications   furosemide 40 MG tablet   magnesium oxide 400 MG tablet   mirtazapine 7.5 MG tablet         Admission H&P summary:                                                                 [facility] is a 84 y.o. female  presents via EMS from [facility] for SOB and chaplain explains chest pain. Per EMS, the  patient was given 3 nitro there without relief. She refused aspirin at that time. She has a history of chronic a-fib, chronic chest pain, CHF, hypertension, and disorder of thyroid.  Recent admission for CVA. Discontinued eliquis and prior admission for hematochezia.   (See full History and Physical for details.)    Consultations:                                                                                    [facility]  Hospitalists     Treatment Team: Attending Provider: Donnie Coffin, DO; Resident: Isac Caddy, MD; Social Worker: Renda Rolls; Speech Language Pathologist: Shelba Flake, SLP; Consulting Physician: Tommy Rainwater, MD; Consulting Physician: Loma Sousa, DO; Registered Nurse: Whitney Post, RN; Technician: Fenton Foy, CNA; Registered Nurse: Roslyn Smiling, RN; Physical Therapist: Tito Dine, PT    Procedures/Radiology performed:                                                 [facility]     XR CHEST AP PORTABLE  CT HEAD WO CONTRAST  US THORACENTESIS  XR CHEST PA OR AP      No orders of the defined types were placed in this encounter.      Allergies:                                                                    [facility]     Lisinopril and Gluten meal        Hospital Course:                                                                                [facility]     Patient here with sob found to have large pleural effusion.  S/p thoracetesis.  No growth on culture.  Unlikely to be exudative.  If recurres based on pt wishes for comfort may consider outpatient thoracentesis instead.    MRI did show an acute infarction with some hemorrhagic conversion but no mass effect. Repeat CT was stable.  No significant residual symptoms from this other than depression.      She did go into afib rvr and required a cardizem drip and is now back on po cardizem rate controlled.  No anticoagulation due to above.  This should be readdressed  outpatient if she is deemed appropriate.  Needs f/u with Dr. Nunzio Cory.    She did have a visit with palliative care but did not want hospice.  She wants treatment but not very aggressive treatment with a focus on comfort.  She was started on remeron for mood and appetite.     She will be [facility] today to SNF [facility].  She is stable.  She feels better and wants to go.        Discharge Day Exam:  Temp:  [97.2 F (36.2 C)-99 F (37.2 C)] 98.2 F (36.8 C)  Heart Rate:  [71-119] 90  Resp Rate:  [16-20] 18  BP: (102-146)/(64-89) 107/67  Wt Readings from Last 3 Encounters:   03/19/17 57.2 kg (126 lb)   02/24/17 50.6 kg (111 lb 8.8 oz)   02/20/17 50.6 kg (111 lb 8 oz)       General: awake, alert, oriented x 3;  no acute distress.  Cardiovascular: regular rate and rhythm, no murmurs.  Lungs: clear to auscultation bilaterally, without wheezing, rhonchi, or rales  Abdomen: soft, non-tender, non-distended; no palpable masses, no hepatosplenomegaly, normoactive bowel sounds, no rebound or guarding  Extremities: no clubbing, cyanosis, or edema        Discharge Condition:                                                                        [facility]     The patient was discharged in stable condition.  Time spent coordinating discharge and reviewing discharge plan:  45 minutes      Signed by: Donnie Coffin, DO    [facility]  [address]  [place], [place] [address]  Pager [phone]  CC: Lynnda Shields, Laqueta Due, MD        This note was generated within an electronic medical record, and portions of it may have been completed with voice recognition software. Typographical, word substitution, pronoun and other language errors may occur. If there are substantial concerns about the content of this note that may affect patient care, please contact the author for clarification.

## 2017-03-25 NOTE — Discharge Summary -  Nursing (Signed)
Report called to Lawson Fiscal at [facility]. Patient to leave via WC van at 1500. Son, Alinda Money, is present.

## 2017-03-27 DIAGNOSIS — M069 Rheumatoid arthritis, unspecified: Secondary | ICD-10-CM | POA: Diagnosis not present

## 2017-03-27 DIAGNOSIS — E785 Hyperlipidemia, unspecified: Secondary | ICD-10-CM | POA: Diagnosis not present

## 2017-03-27 DIAGNOSIS — I639 Cerebral infarction, unspecified: Secondary | ICD-10-CM | POA: Diagnosis not present

## 2017-03-27 DIAGNOSIS — I4891 Unspecified atrial fibrillation: Secondary | ICD-10-CM | POA: Diagnosis not present

## 2017-04-02 DIAGNOSIS — M25472 Effusion, left ankle: Secondary | ICD-10-CM | POA: Diagnosis not present

## 2017-04-03 DIAGNOSIS — R609 Edema, unspecified: Secondary | ICD-10-CM | POA: Diagnosis not present

## 2017-04-07 DIAGNOSIS — I639 Cerebral infarction, unspecified: Secondary | ICD-10-CM | POA: Diagnosis not present

## 2017-04-07 DIAGNOSIS — I739 Peripheral vascular disease, unspecified: Secondary | ICD-10-CM | POA: Diagnosis not present

## 2017-04-07 DIAGNOSIS — I5020 Unspecified systolic (congestive) heart failure: Secondary | ICD-10-CM | POA: Diagnosis not present

## 2017-04-07 DIAGNOSIS — E039 Hypothyroidism, unspecified: Secondary | ICD-10-CM | POA: Diagnosis not present

## 2017-04-07 DIAGNOSIS — M79673 Pain in unspecified foot: Secondary | ICD-10-CM | POA: Diagnosis not present

## 2017-04-07 DIAGNOSIS — I6930 Unspecified sequelae of cerebral infarction: Secondary | ICD-10-CM | POA: Diagnosis not present

## 2017-04-07 DIAGNOSIS — L603 Nail dystrophy: Secondary | ICD-10-CM | POA: Diagnosis not present

## 2017-04-07 DIAGNOSIS — B351 Tinea unguium: Secondary | ICD-10-CM | POA: Diagnosis not present

## 2017-04-08 ENCOUNTER — Other Ambulatory Visit
Admission: RE | Admit: 2017-04-08 | Discharge: 2017-04-08 | Disposition: A | Discharge status: Home / Self Care | Payer: Medicare Other | Source: Ambulatory Visit | Attending: Family Medicine | Admitting: Family Medicine

## 2017-04-08 LAB — CBC AND DIFFERENTIAL
Basophils %: 0.9 % (ref 0.0–3.0)
Basophils Absolute: 0.1 10*3/uL (ref 0.0–0.3)
Eosinophils %: 1.2 % (ref 0.0–7.0)
Eosinophils Absolute: 0.1 10*3/uL (ref 0.0–0.8)
Hematocrit: 40.1 % (ref 36.0–48.0)
Hemoglobin: 13 gm/dL (ref 12.0–16.0)
Lymphocytes Absolute: 3 10*3/uL (ref 0.6–5.1)
Lymphocytes: 35.6 % (ref 15.0–46.0)
MCH: 33 pg (ref 28–35)
MCHC: 32 gm/dL (ref 32–36)
MCV: 103 fL — ABNORMAL HIGH (ref 80–100)
MPV: 8.2 fL (ref 6.0–10.0)
Monocytes Absolute: 0.7 10*3/uL (ref 0.1–1.7)
Monocytes: 7.7 % (ref 3.0–15.0)
Neutrophils %: 54.7 % (ref 42.0–78.0)
Neutrophils Absolute: 4.7 10*3/uL (ref 1.7–8.6)
PLT CT: 398 10*3/uL (ref 130–440)
RBC: 3.89 10*6/uL (ref 3.80–5.00)
RDW: 14.1 % — ABNORMAL HIGH (ref 11.0–14.0)
WBC: 8.5 10*3/uL (ref 4.0–11.0)

## 2017-04-09 DIAGNOSIS — I4891 Unspecified atrial fibrillation: Secondary | ICD-10-CM | POA: Diagnosis not present

## 2017-04-09 DIAGNOSIS — J90 Pleural effusion, not elsewhere classified: Secondary | ICD-10-CM | POA: Diagnosis not present

## 2017-04-10 ENCOUNTER — Ambulatory Visit (INDEPENDENT_AMBULATORY_CARE_PROVIDER_SITE_OTHER)
Admission: RE | Admit: 2017-04-10 | Discharge: 2017-04-10 | Disposition: A | Discharge status: Home / Self Care | Payer: Medicare Other | Source: Ambulatory Visit | Attending: Nurse Practitioner | Admitting: Nurse Practitioner

## 2017-04-10 ENCOUNTER — Other Ambulatory Visit (INDEPENDENT_AMBULATORY_CARE_PROVIDER_SITE_OTHER): Payer: Self-pay | Admitting: Nurse Practitioner

## 2017-04-10 DIAGNOSIS — J9811 Atelectasis: Secondary | ICD-10-CM | POA: Diagnosis not present

## 2017-04-10 DIAGNOSIS — J90 Pleural effusion, not elsewhere classified: Secondary | ICD-10-CM | POA: Diagnosis not present

## 2017-04-11 DIAGNOSIS — J90 Pleural effusion, not elsewhere classified: Secondary | ICD-10-CM | POA: Diagnosis not present

## 2017-04-11 DIAGNOSIS — J069 Acute upper respiratory infection, unspecified: Secondary | ICD-10-CM | POA: Diagnosis not present

## 2017-04-11 DIAGNOSIS — M069 Rheumatoid arthritis, unspecified: Secondary | ICD-10-CM | POA: Diagnosis not present

## 2017-04-11 DIAGNOSIS — D849 Immunodeficiency, unspecified: Secondary | ICD-10-CM | POA: Diagnosis not present

## 2017-04-17 DIAGNOSIS — J90 Pleural effusion, not elsewhere classified: Secondary | ICD-10-CM | POA: Diagnosis not present

## 2017-04-17 DIAGNOSIS — R509 Fever, unspecified: Secondary | ICD-10-CM | POA: Diagnosis not present

## 2017-04-17 DIAGNOSIS — M069 Rheumatoid arthritis, unspecified: Secondary | ICD-10-CM | POA: Diagnosis not present

## 2017-04-18 ENCOUNTER — Other Ambulatory Visit
Admission: RE | Admit: 2017-04-18 | Discharge: 2017-04-18 | Disposition: A | Discharge status: Home / Self Care | Payer: Medicare Other | Source: Ambulatory Visit | Attending: Family Medicine | Admitting: Family Medicine

## 2017-04-18 LAB — VH URINALYSIS WITH MICROSCOPIC AND CULTURE IF INDICATED
Bilirubin, UA: NEGATIVE
Blood, UA: NEGATIVE
Glucose, UA: NEGATIVE mg/dL
Ketones UA: NEGATIVE mg/dL
Leukocyte Esterase, UA: NEGATIVE Leu/uL
Nitrite, UA: NEGATIVE
Protein, UR: NEGATIVE mg/dL
RBC, UA: 1 /hpf (ref 0–5)
Squam Epithel, UA: 1 /hpf (ref 0–2)
Urine Specific Gravity: 1.012 (ref 1.001–1.040)
Urobilinogen, UA: NORMAL mg/dL
WBC, UA: 2 /hpf (ref 0–4)
pH, Urine: 6 pH (ref 5.0–8.0)

## 2017-04-18 LAB — CBC AND DIFFERENTIAL
Basophils %: 0.9 % (ref 0.0–3.0)
Basophils Absolute: 0.1 10*3/uL (ref 0.0–0.3)
Eosinophils %: 0.6 % (ref 0.0–7.0)
Eosinophils Absolute: 0.1 10*3/uL (ref 0.0–0.8)
Hematocrit: 42.7 % (ref 36.0–48.0)
Hemoglobin: 13.3 gm/dL (ref 12.0–16.0)
Lymphocytes Absolute: 2.6 10*3/uL (ref 0.6–5.1)
Lymphocytes: 22.2 % (ref 15.0–46.0)
MCH: 33 pg (ref 28–35)
MCHC: 31 gm/dL — ABNORMAL LOW (ref 32–36)
MCV: 106 fL — ABNORMAL HIGH (ref 80–100)
MPV: 8.6 fL (ref 6.0–10.0)
Monocytes Absolute: 1.1 10*3/uL (ref 0.1–1.7)
Monocytes: 9 % (ref 3.0–15.0)
Neutrophils %: 67.3 % (ref 42.0–78.0)
Neutrophils Absolute: 8 10*3/uL (ref 1.7–8.6)
PLT CT: 266 10*3/uL (ref 130–440)
RBC: 4.01 10*6/uL (ref 3.80–5.00)
RDW: 14.4 % — ABNORMAL HIGH (ref 11.0–14.0)
WBC: 11.8 10*3/uL — ABNORMAL HIGH (ref 4.0–11.0)

## 2017-04-18 LAB — COMPREHENSIVE METABOLIC PANEL
ALT: 16 U/L (ref 0–55)
AST (SGOT): 24 U/L (ref 10–42)
Albumin/Globulin Ratio: 1.07 Ratio (ref 0.70–1.50)
Albumin: 3.2 gm/dL — ABNORMAL LOW (ref 3.5–5.0)
Alkaline Phosphatase: 123 U/L (ref 40–145)
Anion Gap: 14.2 mMol/L (ref 7.0–18.0)
BUN / Creatinine Ratio: 13.7 Ratio (ref 10.0–30.0)
BUN: 10 mg/dL (ref 7–22)
Bilirubin, Total: 1.7 mg/dL — ABNORMAL HIGH (ref 0.1–1.2)
CO2: 30.5 mMol/L — ABNORMAL HIGH (ref 20.0–30.0)
Calcium: 9.5 mg/dL (ref 8.5–10.5)
Chloride: 94 mMol/L — ABNORMAL LOW (ref 98–110)
Creatinine: 0.73 mg/dL (ref 0.60–1.20)
EGFR: 75 mL/min/{1.73_m2} (ref 60–150)
Globulin: 3 gm/dL (ref 2.0–4.0)
Glucose: 86 mg/dL (ref 71–99)
Osmolality Calc: 268 mOsm/kg — ABNORMAL LOW (ref 275–300)
Potassium: 3.7 mMol/L (ref 3.5–5.3)
Protein, Total: 6.2 gm/dL (ref 6.0–8.3)
Sodium: 135 mMol/L — ABNORMAL LOW (ref 136–147)

## 2017-04-28 DIAGNOSIS — M069 Rheumatoid arthritis, unspecified: Secondary | ICD-10-CM | POA: Diagnosis not present

## 2017-04-28 DIAGNOSIS — E785 Hyperlipidemia, unspecified: Secondary | ICD-10-CM | POA: Diagnosis not present

## 2017-04-28 DIAGNOSIS — I639 Cerebral infarction, unspecified: Secondary | ICD-10-CM | POA: Diagnosis not present

## 2017-04-28 DIAGNOSIS — R197 Diarrhea, unspecified: Secondary | ICD-10-CM | POA: Diagnosis not present

## 2017-04-28 DIAGNOSIS — I69320 Aphasia following cerebral infarction: Secondary | ICD-10-CM | POA: Diagnosis not present

## 2017-04-28 DIAGNOSIS — I6930 Unspecified sequelae of cerebral infarction: Secondary | ICD-10-CM | POA: Diagnosis not present

## 2017-04-30 DIAGNOSIS — I639 Cerebral infarction, unspecified: Secondary | ICD-10-CM | POA: Diagnosis not present

## 2017-04-30 DIAGNOSIS — I69320 Aphasia following cerebral infarction: Secondary | ICD-10-CM | POA: Diagnosis not present

## 2017-05-05 ENCOUNTER — Encounter (INDEPENDENT_AMBULATORY_CARE_PROVIDER_SITE_OTHER): Payer: Self-pay | Admitting: Nurse Practitioner

## 2017-05-05 ENCOUNTER — Ambulatory Visit (INDEPENDENT_AMBULATORY_CARE_PROVIDER_SITE_OTHER)
Admission: RE | Admit: 2017-05-05 | Discharge: 2017-05-05 | Disposition: A | Discharge status: Home / Self Care | Payer: Medicare Other | Source: Ambulatory Visit | Attending: Nurse Practitioner | Admitting: Nurse Practitioner

## 2017-05-05 DIAGNOSIS — J90 Pleural effusion, not elsewhere classified: Secondary | ICD-10-CM | POA: Diagnosis not present

## 2017-05-05 DIAGNOSIS — J918 Pleural effusion in other conditions classified elsewhere: Secondary | ICD-10-CM | POA: Diagnosis not present

## 2017-05-07 DIAGNOSIS — I69320 Aphasia following cerebral infarction: Secondary | ICD-10-CM | POA: Diagnosis not present

## 2017-05-07 DIAGNOSIS — I639 Cerebral infarction, unspecified: Secondary | ICD-10-CM | POA: Diagnosis not present

## 2017-05-09 DIAGNOSIS — I69320 Aphasia following cerebral infarction: Secondary | ICD-10-CM | POA: Diagnosis not present

## 2017-05-09 DIAGNOSIS — I639 Cerebral infarction, unspecified: Secondary | ICD-10-CM | POA: Diagnosis not present

## 2017-05-16 DIAGNOSIS — I639 Cerebral infarction, unspecified: Secondary | ICD-10-CM | POA: Diagnosis not present

## 2017-05-16 DIAGNOSIS — I69320 Aphasia following cerebral infarction: Secondary | ICD-10-CM | POA: Diagnosis not present

## 2017-05-19 ENCOUNTER — Encounter (INDEPENDENT_AMBULATORY_CARE_PROVIDER_SITE_OTHER): Payer: Self-pay | Admitting: Family Medicine

## 2017-05-19 ENCOUNTER — Ambulatory Visit (INDEPENDENT_AMBULATORY_CARE_PROVIDER_SITE_OTHER)
Admission: RE | Admit: 2017-05-19 | Discharge: 2017-05-19 | Disposition: A | Discharge status: Home / Self Care | Payer: Medicare Other | Source: Ambulatory Visit | Attending: Family Medicine | Admitting: Family Medicine

## 2017-05-19 DIAGNOSIS — J90 Pleural effusion, not elsewhere classified: Secondary | ICD-10-CM | POA: Diagnosis not present

## 2017-05-20 ENCOUNTER — Other Ambulatory Visit
Admission: RE | Admit: 2017-05-20 | Discharge: 2017-05-20 | Disposition: A | Discharge status: Home / Self Care | Payer: Medicare Other | Source: Ambulatory Visit | Attending: Family Medicine | Admitting: Family Medicine

## 2017-05-20 DIAGNOSIS — E559 Vitamin D deficiency, unspecified: Secondary | ICD-10-CM | POA: Diagnosis not present

## 2017-05-21 LAB — VITAMIN D,25 OH,TOTAL: Vitamin D 25-Hydroxy: 19 ng/mL — ABNORMAL LOW (ref 30–80)

## 2017-05-23 DIAGNOSIS — I639 Cerebral infarction, unspecified: Secondary | ICD-10-CM | POA: Diagnosis not present

## 2017-05-23 DIAGNOSIS — I69320 Aphasia following cerebral infarction: Secondary | ICD-10-CM | POA: Diagnosis not present

## 2017-05-26 DIAGNOSIS — J90 Pleural effusion, not elsewhere classified: Secondary | ICD-10-CM | POA: Diagnosis not present

## 2017-05-26 DIAGNOSIS — R0600 Dyspnea, unspecified: Secondary | ICD-10-CM | POA: Diagnosis not present

## 2017-05-26 DIAGNOSIS — I639 Cerebral infarction, unspecified: Secondary | ICD-10-CM | POA: Diagnosis not present

## 2017-05-26 DIAGNOSIS — I69320 Aphasia following cerebral infarction: Secondary | ICD-10-CM | POA: Diagnosis not present

## 2017-05-29 ENCOUNTER — Ambulatory Visit (INDEPENDENT_AMBULATORY_CARE_PROVIDER_SITE_OTHER)
Admission: RE | Admit: 2017-05-29 | Discharge: 2017-05-29 | Disposition: A | Discharge status: Home / Self Care | Payer: Medicare Other | Source: Ambulatory Visit | Attending: Family Medicine | Admitting: Family Medicine

## 2017-05-29 ENCOUNTER — Encounter (INDEPENDENT_AMBULATORY_CARE_PROVIDER_SITE_OTHER): Payer: Self-pay | Admitting: Family Medicine

## 2017-05-29 DIAGNOSIS — I639 Cerebral infarction, unspecified: Secondary | ICD-10-CM | POA: Diagnosis not present

## 2017-05-29 DIAGNOSIS — I69320 Aphasia following cerebral infarction: Secondary | ICD-10-CM | POA: Diagnosis not present

## 2017-05-29 DIAGNOSIS — J90 Pleural effusion, not elsewhere classified: Secondary | ICD-10-CM | POA: Diagnosis not present

## 2017-05-29 DIAGNOSIS — R0602 Shortness of breath: Secondary | ICD-10-CM

## 2017-05-30 DIAGNOSIS — M069 Rheumatoid arthritis, unspecified: Secondary | ICD-10-CM | POA: Diagnosis not present

## 2017-05-30 DIAGNOSIS — J918 Pleural effusion in other conditions classified elsewhere: Secondary | ICD-10-CM | POA: Diagnosis not present

## 2017-05-30 DIAGNOSIS — I6930 Unspecified sequelae of cerebral infarction: Secondary | ICD-10-CM | POA: Diagnosis not present

## 2017-06-03 ENCOUNTER — Other Ambulatory Visit: Payer: Self-pay | Admitting: Family Medicine

## 2017-06-03 DIAGNOSIS — J90 Pleural effusion, not elsewhere classified: Secondary | ICD-10-CM

## 2017-06-04 DIAGNOSIS — J90 Pleural effusion, not elsewhere classified: Secondary | ICD-10-CM | POA: Diagnosis not present

## 2017-06-04 DIAGNOSIS — E559 Vitamin D deficiency, unspecified: Secondary | ICD-10-CM | POA: Diagnosis not present

## 2017-06-04 DIAGNOSIS — M069 Rheumatoid arthritis, unspecified: Secondary | ICD-10-CM | POA: Diagnosis not present

## 2017-06-04 DIAGNOSIS — I69320 Aphasia following cerebral infarction: Secondary | ICD-10-CM | POA: Diagnosis not present

## 2017-06-04 DIAGNOSIS — I5020 Unspecified systolic (congestive) heart failure: Secondary | ICD-10-CM | POA: Diagnosis not present

## 2017-06-04 DIAGNOSIS — F329 Major depressive disorder, single episode, unspecified: Secondary | ICD-10-CM | POA: Diagnosis not present

## 2017-06-04 DIAGNOSIS — I4891 Unspecified atrial fibrillation: Secondary | ICD-10-CM | POA: Diagnosis not present

## 2017-06-05 ENCOUNTER — Ambulatory Visit
Admission: RE | Admit: 2017-06-05 | Discharge: 2017-06-05 | Disposition: A | Discharge status: Home / Self Care | Payer: Medicare Other | Source: Ambulatory Visit | Attending: Family Medicine | Admitting: Family Medicine

## 2017-06-05 ENCOUNTER — Encounter: Payer: Self-pay | Admitting: Family Medicine

## 2017-06-05 ENCOUNTER — Ambulatory Visit
Admission: RE | Admit: 2017-06-05 | Discharge: 2017-06-05 | Disposition: A | Discharge status: Home / Self Care | Payer: Medicare Other | Source: Ambulatory Visit | Attending: Diagnostic Radiology | Admitting: Diagnostic Radiology

## 2017-06-05 DIAGNOSIS — I509 Heart failure, unspecified: Secondary | ICD-10-CM | POA: Diagnosis not present

## 2017-06-05 DIAGNOSIS — I4891 Unspecified atrial fibrillation: Secondary | ICD-10-CM | POA: Diagnosis not present

## 2017-06-05 DIAGNOSIS — J90 Pleural effusion, not elsewhere classified: Secondary | ICD-10-CM | POA: Diagnosis not present

## 2017-06-05 DIAGNOSIS — I110 Hypertensive heart disease with heart failure: Secondary | ICD-10-CM | POA: Diagnosis not present

## 2017-06-05 DIAGNOSIS — R0602 Shortness of breath: Secondary | ICD-10-CM

## 2017-06-05 LAB — PT/INR
PT INR: 1.1 (ref 0.5–1.3)
PT: 11 s (ref 9.5–11.5)

## 2017-06-05 MED ORDER — CEFAZOLIN SODIUM 1 G IJ SOLR
INTRAMUSCULAR | Status: AC
Start: 2017-06-05 — End: ?
  Filled 2017-06-05: qty 1000

## 2017-06-05 MED ORDER — CEFAZOLIN SODIUM 1 G IJ SOLR
2.00 g | Freq: Once | INTRAMUSCULAR | Status: AC
Start: 2017-06-05 — End: 2017-06-05
  Administered 2017-06-05: 09:00:00 2 g via INTRAVENOUS

## 2017-06-05 MED ORDER — FENTANYL CITRATE (PF) 50 MCG/ML IJ SOLN (WRAP)
INTRAMUSCULAR | Status: AC | PRN
Start: 2017-06-05 — End: 2017-06-05
  Administered 2017-06-05: 25 ug via INTRAVENOUS

## 2017-06-05 MED ORDER — MIDAZOLAM HCL 2 MG/2ML IJ SOLN
INTRAMUSCULAR | Status: AC
Start: 2017-06-05 — End: ?
  Filled 2017-06-05: qty 2

## 2017-06-05 MED ORDER — SODIUM CHLORIDE 0.9 % IV SOLN
INTRAVENOUS | Status: DC
Start: 2017-06-05 — End: 2017-06-05

## 2017-06-05 MED ORDER — VH MIDAZOLAM HCI 2 MG/2ML IJ SOLN (IR NARRATOR)
INTRAMUSCULAR | Status: AC | PRN
Start: 2017-06-05 — End: 2017-06-05
  Administered 2017-06-05: 0.5 mg via INTRAVENOUS

## 2017-06-05 MED ORDER — FENTANYL CITRATE (PF) 50 MCG/ML IJ SOLN (WRAP)
INTRAMUSCULAR | Status: AC
Start: 2017-06-05 — End: ?
  Filled 2017-06-05: qty 2

## 2017-06-05 MED ORDER — LIDOCAINE-EPINEPHRINE 2 %-1:200000 IJ SOLN
INTRAMUSCULAR | Status: AC
Start: 2017-06-05 — End: ?
  Filled 2017-06-05: qty 20

## 2017-06-05 MED ORDER — LORAZEPAM 2 MG/ML IJ SOLN
0.50 mg | Freq: Once | INTRAMUSCULAR | Status: DC
Start: 2017-06-05 — End: 2017-06-05
  Filled 2017-06-05: qty 0.25

## 2017-06-05 NOTE — Discharge Instructions (Signed)
Department of Radiology  Post Pleur-X Drain Placement  Patient Discharge Instructions    Instructions:  . Your IV site may feel sore for 24 hours.  You can place cool compresses for comfort at site for first 24 hrs and then heat as needed after that.    Instructions due to Conscious Sedation/Sedatives you received:  . You may feel tired for the remainder of today.  . We recommend you go home after discharge.  . Do NOT participate in activities where you could become injured for the next 24 hours/ following day.  For example do not:  drive, operate heavy machinery, cook, or use power tools.  . Do not make important decisions or sign legal documents for the next 24 hours.  . Start with light easily tolerated foods, and then advance to your normal diet as tolerated.   . Only take over-the-counter or prescription medications for pain, discomfort or fever, as directed.  If pain medications have been prescribed for you, ask your healthcare team how soon it is safe to take.  . You should not drink alcohol, take sleeping pills, or medications that cause drowsiness for 24 hours.  . It is recommended to have supervision or access to seek help for a few hours and/or a caregiver with you overnight.    Seek medical attention for the following:  . Develop a fever greater than 100 F or chills  . Increased or continued pain from the site  . Redness, swelling, warmth, or bleeding or other drainage from the site  . Light headedness or dizziness  . Nausea and /or vomiting  . Shortness of breath or difficulty breathing    If you have any questions regarding your general care please call your physician who ordered your Pleur-X/ managing your medical care.    If you have concerns regarding your procedure site, please call  the Interventional Radiology Department at [phone] Monday-Friday from 7:00am-5:00pm, after 5:00 pm & Weekends please call  [phone] to page the Interventional Radiologist on call.      If you require emergent  medical care go the the closest Emergency Room.

## 2017-06-05 NOTE — Discharge Summary -  Nursing (Signed)
All discharge instructions given to patient & daughter with clear verbal understanding voiced.  Pleur-X drain teach back instructions given to daughter with understanding noted.  All Pleur-X paperwork faxed to [employer].  Assisted patient with dressing while daughter brought car around.  Care transferred to daughter & this RN assisted patient into truck.

## 2017-06-05 NOTE — H&P (Signed)
[facility]  OUTPATIENT HISTORY AND PHYSICAL        Kristina [name] is a 84 y.o. female who presents to the Radiology department today for Pleurex catheter placement.  She has a history of recurrent right pleural effusion secondary to CHF requiring frequent thoracentesis.  It is becoming more difficult for her to come in for her thoracentesis.                PAST MEDICAL HISTORY  Past Medical History:   Diagnosis Date   . A-fib    . Aphasia    . Bradycardia    . Cerebral infarction    . Chest pain    . Congestive heart failure    . Diarrhea    . Disorder of thyroid     hypo   . Hyperlipidemia    . Hypertension    . Melanoma    . Rheumatoid arthritis    . Vitamin D deficiency                    PATIENT MEDICATION  Prescriptions Prior to Admission   Medication Sig Dispense Refill Last Dose   . acetaminophen (TYLENOL) 325 MG tablet Take 650 mg by mouth every 4 (four) hours as needed for Pain.   06/04/2017 at Unknown time   . aspirin 81 MG chewable tablet Chew 1 tablet (81 mg total) by mouth daily.   06/04/2017 at Unknown time   . dilTIAZem (CARDIZEM CD) 240 MG 24 hr capsule Take 240 mg by mouth every morning.       06/04/2017 at Unknown time   . fluticasone (FLONASE) 50 MCG/ACT nasal spray 1 spray by Nasal route daily as needed for Rhinitis or Allergies.       06/04/2017 at Unknown time   . folic acid (FOLVITE) 1 MG tablet Take 1 mg by mouth daily.   06/04/2017 at Unknown time   . furosemide (LASIX) 40 MG tablet Take 1 tablet (40 mg total) by mouth daily.   06/04/2017 at Unknown time   . lactase (LACTAID) 3000 units tablet Take 1 tablet by mouth daily as needed.       06/04/2017 at Unknown time   . latanoprost (XALATAN) 0.005 % ophthalmic solution Place 1 drop into the right eye nightly.       06/04/2017 at Unknown time   . levothyroxine (SYNTHROID, LEVOTHROID) 112 MCG tablet Take 112 mcg by mouth every morning.       06/04/2017 at Unknown time   . LUMIGAN 0.01 % Solution Place 1 drop into both eyes  nightly.   06/04/2017 at Unknown time   . methotrexate 2.5 MG tablet Take 10 mg by mouth Once a week on Monday evening.       06/04/2017 at Unknown time   . mirtazapine (REMERON) 7.5 MG tablet Take 1 tablet (7.5 mg total) by mouth nightly.   06/04/2017 at Unknown time   . potassium chloride (MICRO-K) 10 MEQ CR capsule Take 20 mEq by mouth daily.   06/04/2017 at Unknown time   . predniSONE (DELTASONE) 5 MG tablet Take 5 mg by mouth daily.   06/04/2017 at Unknown time   . PROAIR HFA 108 (90 Base) MCG/ACT inhaler Inhale 2 puffs into the lungs 2 (two) times daily as needed for Wheezing or Shortness of Breath.       06/04/2017 at Unknown time   . SIMBRINZA 1-0.2 % Suspension Place 1 drop into both eyes every morning.  06/04/2017 at Unknown time   . loperamide (IMODIUM) 2 MG capsule Take 2 mg by mouth as needed for Diarrhea.   Unknown at Unknown time   . magnesium oxide (MAG-OX) 400 MG tablet Take 1 tablet (400 mg total) by mouth daily.  0 Unknown at Unknown time   . nitroglycerin (NITROSTAT) 0.4 MG SL tablet Place 0.4 mg under the tongue every 5 (five) minutes as needed for Chest pain.       Unknown at Unknown time         Patient Active Problem List   Diagnosis   . Hematochezia   . PAF (paroxysmal atrial fibrillation)   . HTN (hypertension)   . Hypokalemia   . Hypothyroidism   . Chronic systolic (congestive) heart failure   . Cerebrovascular accident (CVA) with intracranial hemorrhage   . Acute CVA (cerebrovascular accident)   . Pleural effusion due to congestive heart failure         There were no vitals taken for this visit.      Physical Exam   Constitutional: She is oriented to person, place, and time and well-developed, well-nourished, and in no distress.   Cardiovascular: Normal rate, regular rhythm and normal heart sounds.    Pulmonary/Chest: She has decreased breath sounds in the right lower field.   Neurological: She is alert and oriented to person, place, and time.   Skin: Skin is warm and dry.   Psychiatric: Mood,  memory, affect and judgment normal.   Nursing note and vitals reviewed.       Review of Systems   All other systems reviewed and are negative.      RN assessment reviewed.  No changes or additions.      Procedure explained in detail including risks, benefits, alternatives, as well as expected recovery time.       ASA Score: 2     Mallampati Score: 2    Sedation plan including risks/benefits were discussed.    Plan: Sedate as needed up to moderate sedation.      Allergies   Allergen Reactions   . Lisinopril Anaphylaxis   . Gluten Meal        ASSESMENT:    Recurrent right pleural effusion due to CHF requiring frequent thoracentesis        PLAN:   Pleurex catheter placement for home drainage.

## 2017-06-09 DIAGNOSIS — I4891 Unspecified atrial fibrillation: Secondary | ICD-10-CM | POA: Diagnosis not present

## 2017-06-09 DIAGNOSIS — J90 Pleural effusion, not elsewhere classified: Secondary | ICD-10-CM | POA: Diagnosis not present

## 2017-06-09 DIAGNOSIS — I5020 Unspecified systolic (congestive) heart failure: Secondary | ICD-10-CM | POA: Diagnosis not present

## 2017-06-09 DIAGNOSIS — F329 Major depressive disorder, single episode, unspecified: Secondary | ICD-10-CM | POA: Diagnosis not present

## 2017-06-09 DIAGNOSIS — M069 Rheumatoid arthritis, unspecified: Secondary | ICD-10-CM | POA: Diagnosis not present

## 2017-06-09 DIAGNOSIS — I69320 Aphasia following cerebral infarction: Secondary | ICD-10-CM | POA: Diagnosis not present

## 2017-06-12 DIAGNOSIS — M069 Rheumatoid arthritis, unspecified: Secondary | ICD-10-CM | POA: Diagnosis not present

## 2017-06-12 DIAGNOSIS — I5020 Unspecified systolic (congestive) heart failure: Secondary | ICD-10-CM | POA: Diagnosis not present

## 2017-06-12 DIAGNOSIS — I69320 Aphasia following cerebral infarction: Secondary | ICD-10-CM | POA: Diagnosis not present

## 2017-06-12 DIAGNOSIS — J90 Pleural effusion, not elsewhere classified: Secondary | ICD-10-CM | POA: Diagnosis not present

## 2017-06-12 DIAGNOSIS — I4891 Unspecified atrial fibrillation: Secondary | ICD-10-CM | POA: Diagnosis not present

## 2017-06-12 DIAGNOSIS — F329 Major depressive disorder, single episode, unspecified: Secondary | ICD-10-CM | POA: Diagnosis not present

## 2017-06-16 DIAGNOSIS — I5020 Unspecified systolic (congestive) heart failure: Secondary | ICD-10-CM | POA: Diagnosis not present

## 2017-06-16 DIAGNOSIS — I69320 Aphasia following cerebral infarction: Secondary | ICD-10-CM | POA: Diagnosis not present

## 2017-06-16 DIAGNOSIS — I4891 Unspecified atrial fibrillation: Secondary | ICD-10-CM | POA: Diagnosis not present

## 2017-06-16 DIAGNOSIS — J90 Pleural effusion, not elsewhere classified: Secondary | ICD-10-CM | POA: Diagnosis not present

## 2017-06-16 DIAGNOSIS — F329 Major depressive disorder, single episode, unspecified: Secondary | ICD-10-CM | POA: Diagnosis not present

## 2017-06-16 DIAGNOSIS — M069 Rheumatoid arthritis, unspecified: Secondary | ICD-10-CM | POA: Diagnosis not present

## 2017-06-17 ENCOUNTER — Inpatient Hospital Stay
Admission: EM | Admit: 2017-06-17 | Discharge: 2017-06-23 | DRG: 872 | Disposition: A | Discharge status: Home Health | Payer: Medicare Other | Attending: Family Medicine | Admitting: Family Medicine

## 2017-06-17 ENCOUNTER — Emergency Department: Payer: Medicare Other

## 2017-06-17 DIAGNOSIS — J918 Pleural effusion in other conditions classified elsewhere: Secondary | ICD-10-CM | POA: Diagnosis not present

## 2017-06-17 DIAGNOSIS — M069 Rheumatoid arthritis, unspecified: Secondary | ICD-10-CM | POA: Diagnosis present

## 2017-06-17 DIAGNOSIS — R509 Fever, unspecified: Secondary | ICD-10-CM | POA: Diagnosis not present

## 2017-06-17 DIAGNOSIS — B955 Unspecified streptococcus as the cause of diseases classified elsewhere: Secondary | ICD-10-CM | POA: Diagnosis not present

## 2017-06-17 DIAGNOSIS — Z79899 Other long term (current) drug therapy: Secondary | ICD-10-CM | POA: Diagnosis not present

## 2017-06-17 DIAGNOSIS — B954 Other streptococcus as the cause of diseases classified elsewhere: Secondary | ICD-10-CM | POA: Diagnosis not present

## 2017-06-17 DIAGNOSIS — I4891 Unspecified atrial fibrillation: Secondary | ICD-10-CM | POA: Diagnosis not present

## 2017-06-17 DIAGNOSIS — I110 Hypertensive heart disease with heart failure: Secondary | ICD-10-CM | POA: Diagnosis not present

## 2017-06-17 DIAGNOSIS — Z66 Do not resuscitate: Secondary | ICD-10-CM | POA: Diagnosis present

## 2017-06-17 DIAGNOSIS — N390 Urinary tract infection, site not specified: Secondary | ICD-10-CM | POA: Diagnosis not present

## 2017-06-17 DIAGNOSIS — R7881 Bacteremia: Secondary | ICD-10-CM | POA: Diagnosis not present

## 2017-06-17 DIAGNOSIS — E039 Hypothyroidism, unspecified: Secondary | ICD-10-CM | POA: Diagnosis not present

## 2017-06-17 DIAGNOSIS — A408 Other streptococcal sepsis: Secondary | ICD-10-CM | POA: Diagnosis not present

## 2017-06-17 DIAGNOSIS — B379 Candidiasis, unspecified: Secondary | ICD-10-CM | POA: Diagnosis present

## 2017-06-17 DIAGNOSIS — J90 Pleural effusion, not elsewhere classified: Secondary | ICD-10-CM | POA: Diagnosis not present

## 2017-06-17 DIAGNOSIS — I10 Essential (primary) hypertension: Secondary | ICD-10-CM | POA: Diagnosis not present

## 2017-06-17 DIAGNOSIS — Z79890 Hormone replacement therapy (postmenopausal): Secondary | ICD-10-CM | POA: Diagnosis not present

## 2017-06-17 DIAGNOSIS — Z8673 Personal history of transient ischemic attack (TIA), and cerebral infarction without residual deficits: Secondary | ICD-10-CM | POA: Diagnosis not present

## 2017-06-17 DIAGNOSIS — I480 Paroxysmal atrial fibrillation: Secondary | ICD-10-CM | POA: Diagnosis not present

## 2017-06-17 DIAGNOSIS — F329 Major depressive disorder, single episode, unspecified: Secondary | ICD-10-CM | POA: Diagnosis present

## 2017-06-17 DIAGNOSIS — A419 Sepsis, unspecified organism: Secondary | ICD-10-CM | POA: Diagnosis not present

## 2017-06-17 DIAGNOSIS — E876 Hypokalemia: Secondary | ICD-10-CM | POA: Diagnosis present

## 2017-06-17 DIAGNOSIS — I517 Cardiomegaly: Secondary | ICD-10-CM | POA: Diagnosis not present

## 2017-06-17 DIAGNOSIS — I50812 Chronic right heart failure: Secondary | ICD-10-CM | POA: Diagnosis present

## 2017-06-17 DIAGNOSIS — Z7982 Long term (current) use of aspirin: Secondary | ICD-10-CM | POA: Diagnosis not present

## 2017-06-17 DIAGNOSIS — Z85820 Personal history of malignant melanoma of skin: Secondary | ICD-10-CM | POA: Diagnosis not present

## 2017-06-17 DIAGNOSIS — I429 Cardiomyopathy, unspecified: Secondary | ICD-10-CM | POA: Diagnosis present

## 2017-06-17 DIAGNOSIS — I361 Nonrheumatic tricuspid (valve) insufficiency: Secondary | ICD-10-CM | POA: Diagnosis not present

## 2017-06-17 LAB — I-STAT LACTIC ACID
Lactic Acid I-Stat: 2.55 mMol/L (ref 0.50–2.10)
Lactic Acid I-Stat: 1.68 mMol/L (ref 0.50–2.10)
Room Number I-Stat: 18

## 2017-06-17 LAB — CBC AND DIFFERENTIAL
Basophils %: 0.4 % (ref 0.0–3.0)
Basophils Absolute: 0 10*3/uL (ref 0.0–0.3)
Eosinophils %: 0.1 % (ref 0.0–7.0)
Eosinophils Absolute: 0 10*3/uL (ref 0.0–0.8)
Hematocrit: 42.4 % (ref 36.0–48.0)
Hemoglobin: 13.8 gm/dL (ref 12.0–16.0)
Lymphocytes Absolute: 0.9 10*3/uL (ref 0.6–5.1)
Lymphocytes: 9.7 % — ABNORMAL LOW (ref 15.0–46.0)
MCH: 33 pg (ref 28–35)
MCHC: 33 gm/dL (ref 32–36)
MCV: 102 fL — ABNORMAL HIGH (ref 80–100)
MPV: 8.5 fL (ref 6.0–10.0)
Monocytes Absolute: 0.4 10*3/uL (ref 0.1–1.7)
Monocytes: 4.7 % (ref 3.0–15.0)
Neutrophils %: 85 % — ABNORMAL HIGH (ref 42.0–78.0)
Neutrophils Absolute: 7.8 10*3/uL (ref 1.7–8.6)
PLT CT: 256 10*3/uL (ref 130–440)
RBC: 4.16 10*6/uL (ref 3.80–5.00)
RDW: 15.8 % — ABNORMAL HIGH (ref 11.0–14.0)
WBC: 9.2 10*3/uL (ref 4.0–11.0)

## 2017-06-17 LAB — VH URINALYSIS WITH MICROSCOPIC AND CULTURE IF INDICATED
Bilirubin, UA: NEGATIVE
Glucose, UA: NEGATIVE mg/dL
Ketones UA: NEGATIVE mg/dL
Leukocyte Esterase, UA: NEGATIVE Leu/uL
Nitrite, UA: NEGATIVE
Protein, UR: 30 mg/dL — AB
RBC, UA: 1 /hpf (ref 0–5)
Squam Epithel, UA: 1 /hpf (ref 0–2)
Urine Specific Gravity: 1.023 (ref 1.001–1.040)
Urobilinogen, UA: NORMAL mg/dL
WBC, UA: 2 /hpf (ref 0–4)
pH, Urine: 5 pH (ref 5.0–8.0)

## 2017-06-17 LAB — I-STAT CHEM 8 CARTRIDGE
Anion Gap I-Stat: 18 — ABNORMAL HIGH (ref 7.0–16.0)
BUN I-Stat: 24 mg/dL — ABNORMAL HIGH (ref 7–22)
Calcium Ionized I-Stat: 4.2 mg/dL — ABNORMAL LOW (ref 4.35–5.10)
Chloride I-Stat: 97 mMol/L — ABNORMAL LOW (ref 98–110)
Creatinine I-Stat: 0.9 mg/dL (ref 0.60–1.20)
EGFR: 58 mL/min/{1.73_m2} — ABNORMAL LOW (ref 60–150)
Glucose I-Stat: 141 mg/dL — ABNORMAL HIGH (ref 71–99)
Hematocrit I-Stat: 46 % (ref 36.0–48.0)
Hemoglobin I-Stat: 15.6 gm/dL (ref 12.0–16.0)
Potassium I-Stat: 3 mMol/L — CL (ref 3.5–5.3)
Sodium I-Stat: 135 mMol/L — ABNORMAL LOW (ref 136–147)
TCO2 I-Stat: 24 mMol/L (ref 24–29)

## 2017-06-17 LAB — VH I-STAT INR: i-STAT INR: 1.4 (ref 0.0–3.0)

## 2017-06-17 LAB — HEPATIC FUNCTION PANEL
ALT: 16 U/L (ref 0–55)
AST (SGOT): 40 U/L (ref 10–42)
Albumin/Globulin Ratio: 0.84 Ratio (ref 0.70–1.50)
Albumin: 2.7 gm/dL — ABNORMAL LOW (ref 3.5–5.0)
Alkaline Phosphatase: 81 U/L (ref 40–145)
Bilirubin Direct: 0.4 mg/dL — ABNORMAL HIGH (ref 0.0–0.3)
Bilirubin, Total: 0.7 mg/dL (ref 0.1–1.2)
Globulin: 3.2 gm/dL (ref 2.0–4.0)
Protein, Total: 5.9 gm/dL — ABNORMAL LOW (ref 6.0–8.3)

## 2017-06-17 LAB — VH I-STAT INR NOTIFICATION

## 2017-06-17 LAB — ECG 12-LEAD
Patient Age: 87 years
Q-T Interval(Corrected): 452 ms
Q-T Interval: 306 ms
QRS Axis: -22 deg
QRS Duration: 85 ms
T Axis: 57 years
Ventricular Rate: 131 //min

## 2017-06-17 LAB — VH INFLUENZA A/B RAPID TEST
Influenza A: NEGATIVE
Influenza B: NEGATIVE

## 2017-06-17 LAB — VH I-STAT LACTIC ACID NOTIFICATION

## 2017-06-17 LAB — VH I-STAT CHEM 8 NOTIFICATION

## 2017-06-17 LAB — APTT: aPTT: 25.5 s (ref 24.0–34.0)

## 2017-06-17 LAB — LACTATE DEHYDROGENASE: LDH: 550 U/L — ABNORMAL HIGH (ref 94–250)

## 2017-06-17 LAB — VH MRSA DETECTION BY DNA AMPLIFICATION (NARES): MRSA: NOT DETECTED

## 2017-06-17 MED ORDER — PIPERACILLIN-TAZOBACTAM IN DEX 2-0.25 GM/50ML IV SOLN
2.25 g | Freq: Four times a day (QID) | INTRAVENOUS | Status: DC
Start: 2017-06-17 — End: 2017-06-19
  Administered 2017-06-17 – 2017-06-19 (×7): 2.25 g via INTRAVENOUS
  Filled 2017-06-17 (×9): qty 50

## 2017-06-17 MED ORDER — FLUTICASONE PROPIONATE 50 MCG/ACT NA SUSP
1.00 | Freq: Every day | NASAL | Status: DC | PRN
Start: 2017-06-17 — End: 2017-06-23
  Administered 2017-06-17 – 2017-06-22 (×3): 1 via NASAL
  Filled 2017-06-17: qty 16

## 2017-06-17 MED ORDER — LEVOTHYROXINE SODIUM 88 MCG PO TABS
88.00 ug | ORAL_TABLET | Freq: Every day | ORAL | Status: DC
Start: 2017-06-18 — End: 2017-06-23
  Administered 2017-06-18 – 2017-06-23 (×6): 88 ug via ORAL
  Filled 2017-06-17 (×7): qty 1

## 2017-06-17 MED ORDER — ACETAMINOPHEN 160 MG/5ML PO SOLN
650.00 mg | ORAL | Status: DC | PRN
Start: 2017-06-17 — End: 2017-06-23

## 2017-06-17 MED ORDER — VANCOMYCIN HCL 500 MG IV SOLR
500.00 mg | Freq: Two times a day (BID) | INTRAVENOUS | Status: DC
Start: 2017-06-17 — End: 2017-06-17

## 2017-06-17 MED ORDER — SODIUM CHLORIDE 0.9 % IJ SOLN
0.40 mg | INTRAMUSCULAR | Status: DC | PRN
Start: 2017-06-17 — End: 2017-06-23

## 2017-06-17 MED ORDER — VH POTASSIUM CHLORIDE CRYS ER 20 MEQ PO TBCR (WRAP)
40.00 meq | EXTENDED_RELEASE_TABLET | Freq: Once | ORAL | Status: AC
Start: 2017-06-17 — End: 2017-06-17
  Administered 2017-06-17: 16:00:00 40 meq via ORAL

## 2017-06-17 MED ORDER — BRIMONIDINE TARTRATE-TIMOLOL 0.2-0.5 % OP SOLN
1.00 [drp] | Freq: Two times a day (BID) | OPHTHALMIC | Status: DC
Start: 2017-06-17 — End: 2017-06-23
  Administered 2017-06-17 – 2017-06-23 (×12): 1 [drp] via OPHTHALMIC
  Filled 2017-06-17: qty 5

## 2017-06-17 MED ORDER — DILTIAZEM HCL ER COATED BEADS 240 MG PO CP24
240.00 mg | ORAL_CAPSULE | Freq: Every morning | ORAL | Status: DC
Start: 2017-06-18 — End: 2017-06-21
  Administered 2017-06-18 – 2017-06-20 (×2): 240 mg via ORAL
  Filled 2017-06-17 (×4): qty 1

## 2017-06-17 MED ORDER — POTASSIUM CHLORIDE 20 MEQ/15ML (10%) PO SOLN
ORAL | Status: AC
Start: 2017-06-17 — End: ?
  Filled 2017-06-17: qty 30

## 2017-06-17 MED ORDER — VH VANCOMYCIN THERAPY PLACEHOLDER
Status: DC
Start: 2017-06-17 — End: 2017-06-19

## 2017-06-17 MED ORDER — SENNOSIDES-DOCUSATE SODIUM 8.6-50 MG PO TABS
2.00 | ORAL_TABLET | Freq: Every evening | ORAL | Status: DC
Start: 2017-06-17 — End: 2017-06-23
  Administered 2017-06-17 – 2017-06-21 (×3): 2 via ORAL
  Filled 2017-06-17 (×7): qty 2

## 2017-06-17 MED ORDER — DOCUSATE SODIUM 100 MG PO CAPS
100.00 mg | ORAL_CAPSULE | Freq: Every day | ORAL | Status: DC
Start: 2017-06-18 — End: 2017-06-23
  Administered 2017-06-18 – 2017-06-19 (×2): 100 mg via ORAL
  Filled 2017-06-17 (×6): qty 1

## 2017-06-17 MED ORDER — FOLIC ACID 1 MG PO TABS
1.00 mg | ORAL_TABLET | Freq: Every day | ORAL | Status: DC
Start: 2017-06-17 — End: 2017-06-23
  Administered 2017-06-17 – 2017-06-23 (×7): 1 mg via ORAL
  Filled 2017-06-17 (×7): qty 1

## 2017-06-17 MED ORDER — ACETAMINOPHEN 325 MG PO TABS
650.00 mg | ORAL_TABLET | ORAL | Status: DC | PRN
Start: 2017-06-17 — End: 2017-06-23
  Administered 2017-06-17 – 2017-06-22 (×4): 650 mg via ORAL
  Filled 2017-06-17 (×4): qty 2

## 2017-06-17 MED ORDER — ENOXAPARIN SODIUM 30 MG/0.3ML SC SOLN
30.00 mg | SUBCUTANEOUS | Status: DC
Start: 2017-06-17 — End: 2017-06-19
  Administered 2017-06-17 – 2017-06-18 (×2): 30 mg via SUBCUTANEOUS
  Filled 2017-06-17 (×3): qty 0.3

## 2017-06-17 MED ORDER — SODIUM CHLORIDE 0.9 % IV BOLUS
1000.00 mL | Freq: Once | INTRAVENOUS | Status: AC
Start: 2017-06-17 — End: 2017-06-17
  Administered 2017-06-17: 14:00:00 1000 mL via INTRAVENOUS

## 2017-06-17 MED ORDER — SODIUM CHLORIDE 0.9 % IJ SOLN
3.00 mL | Freq: Three times a day (TID) | INTRAMUSCULAR | Status: DC
Start: 2017-06-17 — End: 2017-06-23
  Administered 2017-06-17 – 2017-06-23 (×17): 3 mL via INTRAVENOUS

## 2017-06-17 MED ORDER — PIPERACILLIN-TAZOBACTAM IN DEX 3-0.375 GM/50ML IV SOLN
3.3750 g | Freq: Once | INTRAVENOUS | Status: AC
Start: 2017-06-17 — End: 2017-06-17
  Administered 2017-06-17: 15:00:00 3.375 g via INTRAVENOUS

## 2017-06-17 MED ORDER — VANCOMYCIN HCL 500 MG IV SOLR
500.00 mg | INTRAVENOUS | Status: DC
Start: 2017-06-18 — End: 2017-06-19
  Administered 2017-06-18: 18:00:00 500 mg via INTRAVENOUS
  Filled 2017-06-17: qty 500
  Filled 2017-06-17: qty 100

## 2017-06-17 MED ORDER — ASPIRIN 81 MG PO CHEW
81.00 mg | CHEWABLE_TABLET | Freq: Every day | ORAL | Status: DC
Start: 2017-06-18 — End: 2017-06-19
  Administered 2017-06-18 – 2017-06-19 (×2): 81 mg via ORAL
  Filled 2017-06-17 (×2): qty 1

## 2017-06-17 MED ORDER — METHOTREXATE SODIUM 2.5 MG PO TABS
10.00 mg | ORAL_TABLET | ORAL | Status: DC
Start: 2017-06-24 — End: 2017-06-23

## 2017-06-17 MED ORDER — MAGNESIUM OXIDE 400 MG TABS (WRAP)
400.00 mg | ORAL_TABLET | Freq: Every day | ORAL | Status: DC
Start: 2017-06-17 — End: 2017-06-23
  Administered 2017-06-17 – 2017-06-23 (×7): 400 mg via ORAL
  Filled 2017-06-17 (×7): qty 1

## 2017-06-17 MED ORDER — ALBUTEROL SULFATE HFA 108 (90 BASE) MCG/ACT IN AERS
2.00 | INHALATION_SPRAY | Freq: Four times a day (QID) | RESPIRATORY_TRACT | Status: DC | PRN
Start: 2017-06-17 — End: 2017-06-23

## 2017-06-17 MED ORDER — VH BIO-K PLUS PROBIOTIC 50 BIL CFU CAPSULE
50.00 | DELAYED_RELEASE_CAPSULE | Freq: Every day | ORAL | Status: DC
Start: 2017-06-17 — End: 2017-06-23
  Administered 2017-06-17 – 2017-06-23 (×7): 50 via ORAL
  Filled 2017-06-17 (×6): qty 1

## 2017-06-17 MED ORDER — MIRTAZAPINE 15 MG PO TABS
7.50 mg | ORAL_TABLET | Freq: Every evening | ORAL | Status: DC
Start: 2017-06-17 — End: 2017-06-23
  Administered 2017-06-17 – 2017-06-22 (×6): 7.5 mg via ORAL
  Filled 2017-06-17 (×7): qty 1

## 2017-06-17 MED ORDER — PREDNISONE 5 MG PO TABS
5.00 mg | ORAL_TABLET | Freq: Every day | ORAL | Status: DC
Start: 2017-06-18 — End: 2017-06-23
  Administered 2017-06-18 – 2017-06-23 (×6): 5 mg via ORAL
  Filled 2017-06-17 (×6): qty 1

## 2017-06-17 MED ORDER — FUROSEMIDE 40 MG PO TABS
40.00 mg | ORAL_TABLET | Freq: Every day | ORAL | Status: DC
Start: 2017-06-17 — End: 2017-06-23
  Administered 2017-06-17 – 2017-06-23 (×5): 40 mg via ORAL
  Filled 2017-06-17 (×7): qty 1

## 2017-06-17 MED ORDER — NITROGLYCERIN 0.4 MG SL SUBL
0.40 mg | SUBLINGUAL_TABLET | SUBLINGUAL | Status: DC | PRN
Start: 2017-06-17 — End: 2017-06-23

## 2017-06-17 MED ORDER — LATANOPROST 0.005 % OP SOLN
1.00 [drp] | Freq: Every evening | OPHTHALMIC | Status: DC
Start: 2017-06-17 — End: 2017-06-23
  Administered 2017-06-18 – 2017-06-22 (×6): 1 [drp] via OPHTHALMIC
  Filled 2017-06-17 (×2): qty 1

## 2017-06-17 MED ORDER — VANCOMYCIN HCL IN DEXTROSE 1-5 GM/200ML-% IV SOLN
1000.00 mg | Freq: Once | INTRAVENOUS | Status: AC
Start: 2017-06-17 — End: 2017-06-17
  Administered 2017-06-17: 15:00:00 1000 mg via INTRAVENOUS

## 2017-06-17 MED ORDER — PIPERACILLIN-TAZOBACTAM IN DEX 3-0.375 GM/50ML IV SOLN
INTRAVENOUS | Status: AC
Start: 2017-06-17 — End: ?
  Filled 2017-06-17: qty 50

## 2017-06-17 MED ORDER — ACETAMINOPHEN 650 MG RE SUPP
650.00 mg | RECTAL | Status: DC | PRN
Start: 2017-06-17 — End: 2017-06-23

## 2017-06-17 NOTE — Progress Notes (Signed)
Received patient from the ed. Placed in room 590. V/S completed  will report to on coming RN

## 2017-06-17 NOTE — H&P (Signed)
Admission Date: 06/17/17 4:16 PM  Patient Name: Kristina [name], Kristina [name]  DOB: 1929/07/27, 84 y.o.  MRN: [id number]  Attending Physician: Harriette Bouillon, MD  Primary Care Physician: Ellender Hose, MD        CC:   Chief Complaint   Patient presents with   . Fever       ASSESSMENT                                                                                   [facility]   Active Problems:    Pleural Efussion    PLAN                                                                                                   [facility]   Early Sepsis  High Grade Fever, Tachycardia,Hypotensive, Lactic Acidosis, Left Shift  Blood VW:[id number]  Pleural Fluid Studies  Vanc/Zosyn    Pleural effusion  Likely Parapneumonic effusion, Can't rule out infectious cause due to fever and fluid in R lung  ECHO showing both LV failure EF 40% and RV failure with tricuspid regurgitation (12/13)  CXR:The right chest tube has been adjusted, with a mild decrease in the size of the large right pleural effusion  Pleural catheter placed on 06/05/2017  Fluid (Effusion) studies pending  PO lasix 40mg   IR cath consult to drain   Pt uses "Accal Evacuated Drainage" has tube drained twice weekly via home health  Consider Pulmonology consult for Effusion evaluation    PAF (paroxysmal atrial fibrillation) with RVR  HR in 120s  Cardizem po  No anticoagulation due to MRI did show an acute infarction with some hemorrhagic conversion but no mass effect. Repeat CT was stable. Per Dr.Lyons (02/2017)  Neurology Consult for [facility]    Hx of Cerebral infarction with hemorrhagic conversion   Monitor Clinically     Right/Left Congestive Heart Failure  Lasix 40mg     Hypokalemia  Replete as needed    HTN (hypertension)  No anti-hypertensive at this time    HLD  Not on statin since previous admission    Hypothyroidism  continue levothyroxin qd    Rheumatoid Arthritis  -Methotrexate    Depression  Psychiatry rec. Remeron,due to sleepiness dose reduced to  7.5mg  daily    DVT Prophylaxis:  Diet: As tolerated    Code Status: DNR  Service status: INPATIENT   Anticipate discharge in:2  days.      History of Presenting Illness:  [facility]     Kristina [name] is a 84 y.o. female who presents to the hospital c/o of Fever transferred from [name] for evaluation.  Patient has no complaints at bedside, states she feels fine and just had some green tea.  Patient has no specific complaints including no chest pain, cough, abdominal pain, vomiting, diarrhea, headache, sore throat, urinary symptoms or rashes.    Pt mentioned that she has paramedics/home health nurses that come twice weekly to drain her Park pleural catheter, the device used is a Accel drainage tube.    Past Medical History:                                                                        [facility]     Past Medical History:   Diagnosis Date   . A-fib    . Aphasia    . Arrhythmia     afib   . Bradycardia    . Cerebral infarction    . Chest pain    . Congestive heart failure    . Diarrhea    . Disorder of thyroid     hypo   . Hyperlipidemia    . Hypertension    . Melanoma    . Rheumatoid arthritis    . Vitamin D deficiency      Past Surgical History:                                                                       [facility]     Past Surgical History:   Procedure Laterality Date   . arm surgery      roght   . HIP SURGERY      left     Family History:                                                                                   [facility]   History reviewed. No pertinent family history.  Social History:                                                                                    [employer]     Social History     Social History   . Marital status: Widowed     Spouse name: N/A   . Number  of children: N/A   . Years of education: N/A     Occupational History   . Not on file.     Social History Main  Topics   . Smoking status: Never Smoker   . Smokeless tobacco: Never Used   . Alcohol use Yes      Comment: rarely glass of wine   . Drug use: No   . Sexual activity: Not on file     Other Topics Concern   . Not on file     Social History Narrative   . No narrative on file     Code Status: Prior  Allergies:                                                                                            [facility]     Allergies   Allergen Reactions   . Lisinopril Anaphylaxis   . Gluten Meal      Medications:                                                                                       [facility]     No current facility-administered medications on file prior to encounter.      Current Outpatient Prescriptions on File Prior to Encounter   Medication Sig Dispense Refill   . acetaminophen (TYLENOL) 325 MG tablet Take 650 mg by mouth every 4 (four) hours as needed for Pain.     Marland Kitchen aspirin 81 MG chewable tablet Chew 1 tablet (81 mg total) by mouth daily.     . dilTIAZem (CARDIZEM CD) 240 MG 24 hr capsule Take 240 mg by mouth every morning.         . fluticasone (FLONASE) 50 MCG/ACT nasal spray 1 spray by Nasal route daily as needed for Rhinitis or Allergies.         . folic acid (FOLVITE) 1 MG tablet Take 1 mg by mouth daily.     Marland Kitchen lactase (LACTAID) 3000 units tablet Take 1 tablet by mouth daily as needed.         . latanoprost (XALATAN) 0.005 % ophthalmic solution Place 1 drop into the right eye nightly.         . levothyroxine (SYNTHROID, LEVOTHROID) 112 MCG tablet Take 112 mcg by mouth every morning.         . loperamide (IMODIUM) 2 MG capsule Take 2 mg by mouth as needed for Diarrhea.     . methotrexate 2.5 MG tablet Take 10 mg by mouth Once a week on Monday evening.         . nitroglycerin (NITROSTAT) 0.4 MG SL tablet Place 0.4 mg under the tongue every 5 (five) minutes as needed for Chest pain.         Marland Kitchen  PROAIR HFA 108 (90 Base) MCG/ACT inhaler Inhale 2 puffs into the lungs 2 (two) times daily as  needed for Wheezing or Shortness of Breath.         Marland Kitchen SIMBRINZA 1-0.2 % Suspension Place 1 drop into both eyes every morning.     . [DISCONTINUED] LUMIGAN 0.01 % Solution Place 1 drop into both eyes nightly.     . [DISCONTINUED] potassium chloride (MICRO-K) 10 MEQ CR capsule Take 20 mEq by mouth daily.     . furosemide (LASIX) 40 MG tablet Take 1 tablet (40 mg total) by mouth daily.     . magnesium oxide (MAG-OX) 400 MG tablet Take 1 tablet (400 mg total) by mouth daily.  0   . mirtazapine (REMERON) 7.5 MG tablet Take 1 tablet (7.5 mg total) by mouth nightly.     . [DISCONTINUED] predniSONE (DELTASONE) 5 MG tablet Take 5 mg by mouth daily.         Review Of Systems:                                                                          [facility]     CONST:  Only endorses fever.   ENT: Denies sore throat or trouble swallowing.   CVS: Denies chest pain or palpitations.   RESP: Denies SOB or cough.   GI: Denies nausea, vomiting, diarrhea, constipation or abdominal pain.   GU:  Denies pain or burning with urination.   MS: Denies new muscle or joint pain.   SKIN: Denies rash.   HEME: Denies any bleeding.     Physical Exam:                                                                                  [facility]   Patient Vitals for the past 24 hrs:   BP Temp Temp src Pulse Resp SpO2 Weight   06/17/17 1524 - 100.3 F (37.9 C) Rectal - - - -   06/17/17 1452 - - - 100 (!) 28 95 % -   06/17/17 1432 - - - 96 (!) 31 96 % -   06/17/17 1422 103/64 - - (!) 121 22 91 % -   06/17/17 1409 93/68 - - (!) 119 (!) 25 95 % -   06/17/17 1332 96/61 - - (!) 116 (!) 25 99 % -   06/17/17 1316 100/70 - - (!) 132 (!) 29 - -   06/17/17 1311 - - - - - 94 % -   06/17/17 1309 112/77 - - (!) 122 (!) 34 92 % -   06/17/17 1308 - (!) 101.7 F (38.7 C) Rectal - - - -   06/17/17 1256 93/70 (!) 101.6 F (38.7 C) Tympanic (!) 141 22 - 50.9 kg (112 lb 3.4 oz)     Body mass index is 19.88 kg/m.  No intake or output data in the  24  hours ending 06/17/17 1616    Physical Exam  Constitutional: She is oriented to person, place, and time. She appears well-developed and well-nourished. No distress.   HENT:   Head: Normocephalic and atraumatic.   Eyes: Pupils are equal, round, and reactive to light. Conjunctivae are normal.   Neck: Normal range of motion. Neck supple. No JVD present.   Cardiovascular: Intact distal pulses.  An irregularly irregular rhythm present.  Labile Bps.  Pulmonary/Chest: Effort normal. diminished right side lung sounds RLL/RML  There is a right lateral pleural catheter in place without erythema, clear drainage present in tube.  Abdominal: Soft. There is no tenderness.   Musculoskeletal: She exhibits no edema.   Neurological: She is alert and oriented to person, place, and time.   Skin: Capillary refill takes less than 2 seconds.   Psychiatric: She has a normal mood and affect. Her behavior is normal.  Labs & Imaging reviewed and interpreted by me                   [facility]     Results     Procedure Component Value Units Date/Time    i-Stat Lactic AcID [[id number]] Collected:  06/17/17 1529    Specimen:  Venipuncture Updated:  06/17/17 1532     Room Number, ISTAT 18     Sample, ISTAT Venous     Site, ISTAT OTHER     i-STAT Lactic acid 1.68 mMol/L     Urinalysis w Micro and Culture if Indicated [[id number]]  (Abnormal) Collected:  06/17/17 1430    Specimen:  Urine, Random Updated:  06/17/17 1525     Color, UA Yellow     Clarity, UA Clear     Specific Gravity, UR 1.023     pH, Urine 5.0 pH      Protein, UR 30 (A) mg/dL      Glucose, UA Negative mg/dL      Ketones UA Negative mg/dL      Bilirubin, UA Negative     Blood, UA Small (A)     Nitrite, UA Negative     Urobilinogen, UA Normal mg/dL      Leukocyte Esterase, UA Negative Leu/uL      UR Micro Performed     WBC, UA 2 /hpf      RBC, UA 1 /hpf      Squam Epithel, UA 1 /hpf      Hyaline Casts, UA 6-10 (A) /lpf     Influenza A / B Rapid Test [[id number]] Collected:   06/17/17 1430    Specimen:  Nasal Wash Updated:  06/17/17 1518     Influenza A Negative     Influenza B Negative    Narrative:       Influenza A antigen detection tests are unable to distinquish between novel and seasonal influenza A.    A negative result for either Influenza A or B antigen does not exclude influenza virus infection. Clinical correlation required.    All positive influenza antigen tests (A or B) require placement of patient on droplet precaution isolation.    Urine Culture [[id number]] Collected:  06/17/17 1430    Specimen:  Urine from Clean Catch Updated:  06/17/17 1511    Narrative:       Specimen/Source: Urine Specimens/Clean Catch  Collected: 06/17/2017 14:30     Status: Valued      Last Updated: 06/17/2017 15:10  Culture Result (Prelim)      Culture In Progress          LFT [[id number]]  (Abnormal) Collected:  06/17/17 1330    Specimen:  Plasma Updated:  06/17/17 1451     Protein, Total 5.9 (L) gm/dL      Albumin 2.7 (L) gm/dL      Alkaline Phosphatase 81 U/L      ALT 16 U/L      AST (SGOT) 40 U/L      Bilirubin, Total 0.7 mg/dL      Bilirubin, Direct 0.4 (H) mg/dL      Albumin/Globulin Ratio 0.84 Ratio      Globulin 3.2 gm/dL     Blood Culture - Venipuncture # 1 [[id number]] Collected:  06/17/17 1331    Specimen:  Blood from Venipuncture Updated:  06/17/17 1417    Narrative:       Specimen/Source: Blood/Venipuncture  Collected: 06/17/2017 13:31     Status: Valued      Last Updated: 06/17/2017 14:16                Culture Result (Prelim)      Culture In Progress          APTT [[id number]] Collected:  06/17/17 1330    Specimen:  Blood Updated:  06/17/17 1408     aPTT 25.5 sec     CBC and differential [[id number]]  (Abnormal) Collected:  06/17/17 1330    Specimen:  Blood from Blood Updated:  06/17/17 1401     WBC 9.2 K/cmm      RBC 4.16 M/cmm      Hemoglobin 13.8 gm/dL      Hematocrit 62.9 %      MCV 102 (H) fL      MCH 33 pg      MCHC 33 gm/dL      RDW 52.8 (H) %      PLT CT 256 K/cmm       MPV 8.5 fL      NEUTROPHIL % 85.0 (H) %      Lymphocytes 9.7 (L) %      Monocytes 4.7 %      Eosinophils % 0.1 %      Basophils % 0.4 %      Neutrophils Absolute 7.8 K/cmm      Lymphocytes Absolute 0.9 K/cmm      Monocytes Absolute 0.4 K/cmm      Eosinophils Absolute 0.0 K/cmm      BASO Absolute 0.0 K/cmm     i-Stat Lactic AcID [[id number]]  (Abnormal) Collected:  06/17/17 1337    Specimen:  Venipuncture Updated:  06/17/17 1347     Room Number, ISTAT R     Sample, ISTAT Venous     Site, ISTAT OTHER     i-STAT Lactic acid 2.55 (HH) mMol/L     i-Stat Chem 8 CartrIDge [[id number]]  (Abnormal) Collected:  06/17/17 1337    Specimen:  Blood Updated:  06/17/17 1347     i-STAT Sodium 135 (L) mMol/L      i-STAT Potassium 3.0 (LL) mMol/L      i-STAT Chloride 97 (L) mMol/L      TCO2, ISTAT 24 mMol/L      Ionized Ca, ISTAT 4.20 (L) mg/dL      i-STAT Glucose 034 (H) mg/dL      i-STAT Creatinine 0.90 mg/dL      i-STAT BUN 24 (H) mg/dL      Anion Gap, ISTAT  18.0 (H)     EGFR 58 (L) mL/min/1.13m2      i-STAT Hematocrit 46.0 %      i-STAT Hemoglobin 15.6 gm/dL     I-Stat INR [[id number]] Collected:  06/17/17 1336    Specimen:  Blood Updated:  06/17/17 1340     i-STAT INR 1.4    I-Stat INR Notification [[id number]] Collected:  06/17/17 1330    Specimen:  ISTAT Updated:  06/17/17 1336     I-STAT Notification Istat Notification    I-Stat Chem 8 Notification [[id number]] Collected:  06/17/17 1330    Specimen:  ISTAT Updated:  06/17/17 1336     I-STAT Notification Istat Notification    I-Stat Lactic Acid Notification [facility]  only) [[id number]] Collected:  06/17/17 1330    Specimen:  ISTAT Updated:  06/17/17 1336     I-STAT Notification Istat Notification        EKG Results     Procedure Component Value Units Date/Time    ECG 12 lead (Stat) [[id number]] Collected:  06/17/17 1309     Updated:  06/17/17 1317     Patient Age 75 years      Patient DOB April 01, 1929     Patient Height --     Patient Weight --     Interpretation Text --      Atrial fibrillation  Borderline left axis deviation  Low voltage, extremity leads      Electronically Signed On 06-17-2017 13:17:27 EDT by Elwin Mocha       Physician Interpreter [facility]     Ventricular Rate 131 //min      QRS Duration 85 ms      P-R Interval -- ms      Q-T Interval 306 ms      Q-T Interval(Corrected) 452 ms      P Wave Axis -- deg      QRS Axis -22 deg      T Axis 57 years           XR Chest AP Portable   Final Result   The right chest tube has been adjusted, with a mild decrease in the size of the large right pleural effusion. There is no pneumothorax.      ReadingStation:[id number]          Signed by: Isac Caddy, MD PGY-1  Pager: [phone]

## 2017-06-17 NOTE — ED Triage Notes (Signed)
Pt was noted by home healt to be febrile  At home.  Pt went to [facility] this am for evaluation of fever, pt has been on antibiotics for recent pneumonia.

## 2017-06-17 NOTE — ED Notes (Addendum)
Pt daughter gave her mother her weekly dose of methotrexate and stated that the last time she was here and missed a dose, "bad things started happening" to her mothers health

## 2017-06-17 NOTE — Discharge Instructions (Signed)
Febrile Illness with Uncertain Cause (Adult)  You have a fever, but the cause is unknown. A fever is a natural reaction of the body to an illness such as infection due to a virus or bacteria. Sometimes other conditions such as cancer or immune diseases can cause fever, especially if the fever has lasted for more than a week or 2. In most cases, the temperature itself is not harmful. It actually helps the body fight infections. A fever does not need to be treated unless you feel very uncomfortable.  Sometimes a fever can be an early sign of a more serious infection, so make sure to follow up if your condition worsens.  Home care  Unless given other instructions by your healthcare provider, follow these guidelines when caring for yourself at home.  General care   If your symptoms are not severe, rest at home for the first 2 to 3 days. When you resume activity, don't let yourself get too tired.   For your overall health, don't smoke. Also avoid being exposed to secondhand smoke.   Your appetite may be poor, so a light diet is fine. Avoid dehydration by drinking 6 to 8 glasses of fluids per day (such as water, soft drinks, sports drinks, juices, tea, or soup). If you have congestion, extra fluids will help loosen secretions in the nose and lungs.  Medicines   You can take acetaminophen or ibuprofen for pain or to lower your temperature, unless you were given a different medicine to use. (Note: If you have chronic liver or kidney disease or have ever had a stomach ulcer or gastrointestinal bleeding, talk with your healthcare provider before using these medicines. Also talk to your provider if you are taking medicine to prevent blood clots.) Aspirin should never be given to anyone younger than 82 years of age who is ill with a viral infection or fever. It may cause severe liver or brain damage.   If you were given antibiotics for an infection, take them until they are used up, or your healthcare provider tells you  to stop. It is important to finish the antibiotics even though you feel better. This is to make sure the infection has cleared. Be aware that antibiotics are not usually given for a viral infection or a fever with an unknown cause.   Over-the-counter medicines will not shorten the duration of the illness. However, they may be helpful for the following symptoms: cough, sore throat, or nasal and sinus congestion. Ask your pharmacist for product suggestions. (Note: Don't use decongestants if you have high blood pressure.)  Follow-up care  Follow up with your healthcare provider, or as advised.   If a culture or other lab tests were done, you will be notified if your treatment needs to be changed. You can call as directed for the results.   If X-rays, a CT, or an ultrasound were done, a specialist will review them. You will be notified of any findings that may affect your care.  Call 911  Call 911 if any of these occur:   Trouble breathing or swallowing, or wheezing   Chest pain   Confusion   Extreme drowsiness or trouble awakening   Fainting or loss of consciousness   Rapid heart rate   Low blood pressure   Vomiting blood, or large amounts of blood in stool   Seizure  When to seek medical advice  Call your healthcare provider right away if any of these occur:   Cough with lots   of colored sputum (mucus) or blood in your sputum   Severe headache   Face, neck, throat, or ear pain   Feeling drowsy   Abdominal pain   Repeated vomiting or diarrhea   Joint pain or a new rash   Burning when urinating   Fever of 100.4F (38C) or higher, or as directed by your healthcare provider   Feeling weak or dizzy  Date Last Reviewed: 01/17/2016   2000-2018 [employer] [address], [place] [address]. All rights reserved. This information is not intended as a substitute for professional medical care. Always follow your healthcare professional's instructions.

## 2017-06-17 NOTE — ED Provider Notes (Signed)
ED DOC NOTE     History provided by the patient and a family member    History is limited by none    HPI     84 y.o. female  Sent from [place] for fever    Onset two days  Worsened by nothing  Relieved by nothing, given tylenol  Quality fever  Radiation no pain  Severity     Associated symptoms HAS NO specific complaints, especially  No chest pain, cough, abdominal pain, vomiting, diarrhea, headache, sore throat, ear ache, urinary symptoms or rashes.    She had a right sided pleural catheter placed on 3/21 by IR.     Review of Systems   Review of Systems   All other systems reviewed and are negative.          PAST MEDICAL/SURGICAL HISTORY:  Past Medical History:   Diagnosis Date   . A-fib    . Aphasia    . Arrhythmia     afib   . Bradycardia    . Cerebral infarction    . Chest pain    . Congestive heart failure    . Diarrhea    . Disorder of thyroid     hypo   . Hyperlipidemia    . Hypertension    . Melanoma    . Rheumatoid arthritis    . Vitamin D deficiency       Past Surgical History:   Procedure Laterality Date   . arm surgery      roght   . HIP SURGERY      left     Additional Past Medical/Surgical History:       SOCIAL AND FAMILY HISTORY:  Social History   Substance Use Topics   . Smoking status: Never Smoker   . Smokeless tobacco: Never Used   . Alcohol use Yes      Comment: rarely glass of wine     History reviewed. No pertinent family history.     Additional Social and Family History:  Additional SH: [x]   I reviewed SH above       Additional FH: [x]  I reviewed FH above    Allergies   Allergen Reactions   . Lisinopril Anaphylaxis   . Gluten Meal      Additional Allergies:    Prior to Admission medications    Medication Sig Start Date End Date Taking? Authorizing Provider   acetaminophen (TYLENOL) 325 MG tablet Take 650 mg by mouth every 4 (four) hours as needed for Pain.   Yes [provider]   aspirin 81 MG chewable tablet Chew 1 tablet (81 mg total) by mouth daily. 03/05/17  Yes Marlou Sa, MD    dilTIAZem (CARDIZEM CD) 240 MG 24 hr capsule Take 240 mg by mouth every morning.     01/31/17  Yes [provider]   fluticasone (FLONASE) 50 MCG/ACT nasal spray 1 spray by Nasal route daily as needed for Rhinitis or Allergies.       Yes [provider]   folic acid (FOLVITE) 1 MG tablet Take 1 mg by mouth daily.   Yes [provider]   lactase (LACTAID) 3000 units tablet Take 1 tablet by mouth daily as needed.       Yes [provider]   latanoprost (XALATAN) 0.005 % ophthalmic solution Place 1 drop into the right eye nightly.       Yes [provider]   levothyroxine (SYNTHROID, LEVOTHROID) 112 MCG tablet  Take 112 mcg by mouth every morning.     01/20/17  Yes [provider]   loperamide (IMODIUM) 2 MG capsule Take 2 mg by mouth as needed for Diarrhea.   Yes [provider]   LUMIGAN 0.01 % Solution Place 1 drop into both eyes nightly. 01/31/17  Yes [provider]   methotrexate 2.5 MG tablet Take 10 mg by mouth Once a week on Monday evening.     01/21/17  Yes [provider]   nitroglycerin (NITROSTAT) 0.4 MG SL tablet Place 0.4 mg under the tongue every 5 (five) minutes as needed for Chest pain.     01/31/17  Yes [provider]   potassium chloride (MICRO-K) 10 MEQ CR capsule Take 20 mEq by mouth daily.   Yes [provider]   PROAIR HFA 108 (90 Base) MCG/ACT inhaler Inhale 2 puffs into the lungs 2 (two) times daily as needed for Wheezing or Shortness of Breath.     12/31/16  Yes [provider]   SIMBRINZA 1-0.2 % Suspension Place 1 drop into both eyes every morning. 01/31/17  Yes [provider]   furosemide (LASIX) 40 MG tablet Take 1 tablet (40 mg total) by mouth daily. 03/25/17   Donnie Coffin, DO   magnesium oxide (MAG-OX) 400 MG tablet Take 1 tablet (400 mg total) by mouth daily. 03/25/17   Donnie Coffin, DO   mirtazapine (REMERON) 7.5 MG tablet Take 1 tablet (7.5 mg total) by mouth  nightly. 03/25/17   Donnie Coffin, DO   predniSONE (DELTASONE) 5 MG tablet Take 5 mg by mouth daily.    [provider]       PHYSICAL EXAM   Vitals:    06/18/17 0701   BP: (!) 134/96   Pulse: (!) 120   Resp: 18   Temp: 97.9 F (36.6 C)   SpO2: 97%     [x]  Nursing note reviewed [x]  Vitals reviewed   Pulse Oximetry on Room Air Normal  Physical Exam   Constitutional: She is oriented to person, place, and time. She appears well-developed and well-nourished. No distress.   frail   HENT:   Head: Normocephalic and atraumatic.   Eyes: Pupils are equal, round, and reactive to light. Conjunctivae are normal.   Neck: Normal range of motion. Neck supple. No JVD present.   Cardiovascular: Intact distal pulses.  An irregularly irregular rhythm present. Tachycardia present.    Pulmonary/Chest: Effort normal.   diminished right side  There is a right lateral pleural catheter in place without erythema or drainage from the site   Abdominal: Soft. There is no tenderness.   Musculoskeletal: She exhibits no edema.   Neurological: She is alert and oriented to person, place, and time.   Skin: Capillary refill takes less than 2 seconds.   Psychiatric: She has a normal mood and affect. Her behavior is normal.       LABS/TESTS:  Results     Procedure Component Value Units Date/Time    Blood Culture - Venipuncture # 1 [[id number]] Collected:  06/17/17 1331    Specimen:  Blood from Venipuncture Updated:  06/18/17 0201    Narrative:       Specimen/Source: Blood/Venipuncture  Collected: 06/17/2017 13:31     Status: Valued      Last Updated: 06/18/2017 02:01                Gram Stain (Prelim)      1 of 2  Cultures Positive      Results called and read back by (licensed clinician/date/time/tech)      Karolee Stamps BISPO RN 06/18/17 0200 80.        Gram Stain  Gram Positive Cocci                    In Chains          Blood Culture - Venipuncture  # 2 [[id number]] Collected:  06/17/17 1849    Specimen:  Blood from Venipuncture Updated:   06/18/17 0000    Narrative:       Specimen/Source: Blood/Venipuncture  Collected: 06/17/2017 18:49     Status: Valued      Last Updated: 06/17/2017 23:58                Culture Result (Prelim)      Culture In Progress          Serum LDH [[id number]]  (Abnormal) Collected:  06/17/17 1849    Specimen:  Plasma Updated:  06/17/17 1939     LDH 550 (H) U/L     MRSA Detection by DNA Amp. (Nares) [[id number]] Collected:  06/17/17 1655    Specimen:  Nares from Nares Updated:  06/17/17 1821     MRSA No MRSA detected.    I-Stat Lactic Acid Notification [facility], [facility], [facility]  only) [[id number]] Collected:  06/17/17 1545    Specimen:  ISTAT Updated:  06/17/17 1623     I-STAT Notification Istat Notification    i-Stat Lactic AcID [[id number]] Collected:  06/17/17 1529    Specimen:  Venipuncture Updated:  06/17/17 1532     Room Number, ISTAT 18     Sample, ISTAT Venous     Site, ISTAT OTHER     i-STAT Lactic acid 1.68 mMol/L     Urinalysis w Micro and Culture if Indicated [[id number]]  (Abnormal) Collected:  06/17/17 1430    Specimen:  Urine, Random Updated:  06/17/17 1525     Color, UA Yellow     Clarity, UA Clear     Specific Gravity, UR 1.023     pH, Urine 5.0 pH      Protein, UR 30 (A) mg/dL      Glucose, UA Negative mg/dL      Ketones UA Negative mg/dL      Bilirubin, UA Negative     Blood, UA Small (A)     Nitrite, UA Negative     Urobilinogen, UA Normal mg/dL      Leukocyte Esterase, UA Negative Leu/uL      UR Micro Performed     WBC, UA 2 /hpf      RBC, UA 1 /hpf      Squam Epithel, UA 1 /hpf      Hyaline Casts, UA 6-10 (A) /lpf     Influenza A / B Rapid Test [[id number]] Collected:  06/17/17 1430    Specimen:  Nasal Wash Updated:  06/17/17 1518     Influenza A Negative     Influenza B Negative    Narrative:       Influenza A antigen detection tests are unable to distinquish between novel and seasonal influenza A.    A negative result for either Influenza A or B antigen does not exclude influenza virus infection. Clinical correlation  required.    All positive influenza antigen tests (A or B) require placement of patient on droplet precaution isolation.    Urine Culture [[id number]] Collected:  06/17/17  1430    Specimen:  Urine from Clean Catch Updated:  06/17/17 1511    Narrative:       Specimen/Source: Urine Specimens/Clean Catch  Collected: 06/17/2017 14:30     Status: Valued      Last Updated: 06/17/2017 15:10                Culture Result (Prelim)      Culture In Progress          LFT [[id number]]  (Abnormal) Collected:  06/17/17 1330    Specimen:  Plasma Updated:  06/17/17 1451     Protein, Total 5.9 (L) gm/dL      Albumin 2.7 (L) gm/dL      Alkaline Phosphatase 81 U/L      ALT 16 U/L      AST (SGOT) 40 U/L      Bilirubin, Total 0.7 mg/dL      Bilirubin, Direct 0.4 (H) mg/dL      Albumin/Globulin Ratio 0.84 Ratio      Globulin 3.2 gm/dL     APTT [[id number]] Collected:  06/17/17 1330    Specimen:  Blood Updated:  06/17/17 1408     aPTT 25.5 sec     CBC and differential [[id number]]  (Abnormal) Collected:  06/17/17 1330    Specimen:  Blood from Blood Updated:  06/17/17 1401     WBC 9.2 K/cmm      RBC 4.16 M/cmm      Hemoglobin 13.8 gm/dL      Hematocrit 08.6 %      MCV 102 (H) fL      MCH 33 pg      MCHC 33 gm/dL      RDW 57.8 (H) %      PLT CT 256 K/cmm      MPV 8.5 fL      NEUTROPHIL % 85.0 (H) %      Lymphocytes 9.7 (L) %      Monocytes 4.7 %      Eosinophils % 0.1 %      Basophils % 0.4 %      Neutrophils Absolute 7.8 K/cmm      Lymphocytes Absolute 0.9 K/cmm      Monocytes Absolute 0.4 K/cmm      Eosinophils Absolute 0.0 K/cmm      BASO Absolute 0.0 K/cmm     i-Stat Lactic AcID [[id number]]  (Abnormal) Collected:  06/17/17 1337    Specimen:  Venipuncture Updated:  06/17/17 1347     Room Number, ISTAT R     Sample, ISTAT Venous     Site, ISTAT OTHER     i-STAT Lactic acid 2.55 (HH) mMol/L     i-Stat Chem 8 CartrIDge [[id number]]  (Abnormal) Collected:  06/17/17 1337    Specimen:  Blood Updated:  06/17/17 1347     i-STAT Sodium 135 (L) mMol/L       i-STAT Potassium 3.0 (LL) mMol/L      i-STAT Chloride 97 (L) mMol/L      TCO2, ISTAT 24 mMol/L      Ionized Ca, ISTAT 4.20 (L) mg/dL      i-STAT Glucose 272 (H) mg/dL      i-STAT Creatinine 0.90 mg/dL      i-STAT BUN 24 (H) mg/dL      Anion Gap, ISTAT 53.6 (H)     EGFR 58 (L) mL/min/1.50m2      i-STAT Hematocrit 46.0 %      i-STAT Hemoglobin 15.6  gm/dL     I-Stat INR [[id number]] Collected:  06/17/17 1336    Specimen:  Blood Updated:  06/17/17 1340     i-STAT INR 1.4    I-Stat INR Notification [[id number]] Collected:  06/17/17 1330    Specimen:  ISTAT Updated:  06/17/17 1336     I-STAT Notification Istat Notification    I-Stat Chem 8 Notification [[id number]] Collected:  06/17/17 1330    Specimen:  ISTAT Updated:  06/17/17 1336     I-STAT Notification Istat Notification    I-Stat Lactic Acid Notification [facility], [facility], [facility]  only) [[id number]] Collected:  06/17/17 1330    Specimen:  ISTAT Updated:  06/17/17 1336     I-STAT Notification Istat Notification            Xr Chest Ap Portable    Result Date: 06/17/2017  The right chest tube has been adjusted, with a mild decrease in the size of the large right pleural effusion. There is no pneumothorax. ReadingStation:[id number]            EKG:   I have interpreted the EKG at the time it was performed and my official reading is as follows:  Last EKG Result     Procedure Component Value Units Date/Time    ECG 12 lead (Stat) [[id number]] Collected:  06/17/17 1309     Updated:  06/17/17 1317     Patient Age 67 years      Patient DOB 09/01/1929     Patient Height --     Patient Weight --     Interpretation Text --     Atrial fibrillation  Borderline left axis deviation  Low voltage, extremity leads      Electronically Signed On 06-17-2017 13:17:27 EDT by Elwin Mocha       Physician Interpreter [facility]     Ventricular Rate 131 //min      QRS Duration 85 ms      P-R Interval -- ms      Q-T Interval 306 ms      Q-T Interval(Corrected) 452 ms      P Wave Axis -- deg      QRS Axis -22 deg       T Axis 57 years             ED COURSE:  BP (!) 134/96   Pulse (!) 120   Temp 97.9 F (36.6 C) (Oral)   Resp 18   Ht 1.6 m   Wt 50.3 kg   SpO2 97%   BMI 19.64 kg/m   ED Course as of Jun 18 721   Tue Jun 17, 2017   1412 i-STAT Potassium: (!!) 3.0 [SB]   1412 EGFR: (!) 58 [SB]   1529 Influenza A: Negative [SB]   1529 Influenza B: Negative [SB]      ED Course User Index  [SB] Kristina Bouillon, MD     CRITICAL CARE   By Kristina Bouillon, MD  7:23 AM    This patient presented with fever, signs of sever sepsis and was at high risk of life-threatening deterioration. Multiple interventions were necessary to diagnose, stabilize and treat.    Total Time: 37  Minutes of critical care was necessary to evaluate and stabilize.    Critical care services are exclusive of time spent performing procedures. The time does include time spent directly with Lequita Asal, documenting, reviewing labs and radiographs, and speaking with medical staff.  PROCEDURES:        IMPRESSION:     84 y.o. female with  1. Febrile illness, acute        DIFFERENTIAL DIAGNOSIS:  SEPSIS, PNEUMONIA, URINARY TRACT INFECTION, PYELONEPHRITIS, INFLUENZA, LYME, VIRAL SYNDROME, UPPER RESPIRATORY INFECTION, BRONCHITIS, CELLULITIS, PHARYNGITIS, ENDOCARDITIS, UNKNOWN SOURCE.      MEDICAL DECISION MAKING:   I have evaluated all labs and imaging studies and have determined that the patient requires admission to the hospital for further evaluation and treatment of   1. Febrile illness, acute    .  I have explained all results to the patient and family and all questions have been answered.  The patient and family have been informed of the need for admission and they are in agreement with this plan.      PLAN:   ED Disposition     ED Disposition Condition Date/Time Comment    Admit  Tue Jun 17, 2017  5:28 PM Admitting Physician: Darra Lis [[id number]]   Diagnosis: Pleural effusion [242230]   Estimated Length of Stay: > or = to 2 midnights    Tentative Discharge Plan?: Home or Self Care [1]   Patient Class: Inpatient [101]          Current Discharge Medication List               Kristina Bouillon, MD  06/18/17 [phone]

## 2017-06-17 NOTE — ED Notes (Addendum)
Attempted to access pleur x drain.  Access denied , lack of correct adapter to withdraw specimen.  Multiple calls made to following dept: and charge RN Sherri  ED,   Calls to specials, Interventional radiology, OR charge [place], [place] [employer].  Unable to collect specimen at this time.

## 2017-06-17 NOTE — Progress Notes (Signed)
Pharmacy Vancomycin Dosing Consult Note  Kristina [name]    Assessment:   1. Day #1 vancomycin in an 68 yoF transfer from [place] with early sepsis, fever, tachycardia, hypotension, lactic acidosis, right lung pleural effusion.      Plan:   1. Vancomycin 500 mg IV q24h  2. Trough level TBD  3. Pharmacy will follow the patient's renal function, vancomycin levels, and dosing during the course of therapy. If you have any questions, please contact the pharmacist at [phone].     Indication: early sepsis    Age: 84 y.o.  Height: 1.6 m (5\' 3" )  Weight:  50.3 kg (110 lb 14.3 oz)  IBW: 52.4 kg  DW: 50.3 kg    CrCl: 36.3 ml/min     Population Estimate Pharmacokinetics:   Kel: 0.035 hr-1        t50: 20 hrs        Vd: 35.2 L         Expected Trough: 13 mg/L              Date Regimen Trough Date/Time Trough Level Pt Specific Ke   06/17/17 1 gm IV x 1 dose      06/18/17 0.5 gm IV q24h                        Recent Labs  Lab 06/17/17  1337   i-STAT Creatinine 0.90       Recent Labs  Lab 06/17/17  1330   WBC 9.2     Temp (24hrs), Avg:100.5 F (38.1 C), Min:99.5 F (37.5 C), Max:101.7 F (38.7 C)      Cultures:       Date Source Organism Sensitivities Resistance

## 2017-06-17 NOTE — ED Notes (Signed)
Resident and dr Darin Engels here to evaluate patient.  Dr. Darin Engels spoke with Dr. Anselm Jungling IR on call physiscian . THey will consult with patient in the am for access to Pleur ix drain

## 2017-06-17 NOTE — ED Notes (Signed)
Blood culture 1 drawn from right [facility] when placing IV. Site prepped with 4 chlorhexidine swabs and allowed to dry. Bottles prepped with alcohol and allowed to dry. Blood pulled very slow. Sent to lab

## 2017-06-18 LAB — BASIC METABOLIC PANEL
Anion Gap: 11.9 mMol/L (ref 7.0–18.0)
BUN / Creatinine Ratio: 25.8 Ratio (ref 10.0–30.0)
BUN: 23 mg/dL — ABNORMAL HIGH (ref 7–22)
CO2: 28.6 mMol/L (ref 20.0–30.0)
Calcium: 8.7 mg/dL (ref 8.5–10.5)
Chloride: 101 mMol/L (ref 98–110)
Creatinine: 0.89 mg/dL (ref 0.60–1.20)
EGFR: 59 mL/min/{1.73_m2} — ABNORMAL LOW (ref 60–150)
Glucose: 103 mg/dL — ABNORMAL HIGH (ref 71–99)
Osmolality Calc: 280 mOsm/kg (ref 275–300)
Potassium: 3.5 mMol/L (ref 3.5–5.3)
Sodium: 138 mMol/L (ref 136–147)

## 2017-06-18 LAB — VH GLUCOSE BODY FLUID: Body Fluid Glucose: 128 mg/dL

## 2017-06-18 LAB — CBC AND DIFFERENTIAL
Basophils %: 0.4 % (ref 0.0–3.0)
Basophils Absolute: 0 10*3/uL (ref 0.0–0.3)
Eosinophils %: 0 % (ref 0.0–7.0)
Eosinophils Absolute: 0 10*3/uL (ref 0.0–0.8)
Hematocrit: 43.5 % (ref 36.0–48.0)
Hemoglobin: 13.4 gm/dL (ref 12.0–16.0)
Lymphocytes Absolute: 1.6 10*3/uL (ref 0.6–5.1)
Lymphocytes: 12.9 % — ABNORMAL LOW (ref 15.0–46.0)
MCH: 33 pg (ref 28–35)
MCHC: 31 gm/dL — ABNORMAL LOW (ref 32–36)
MCV: 106 fL — ABNORMAL HIGH (ref 80–100)
MPV: 8.4 fL (ref 6.0–10.0)
Monocytes Absolute: 0.6 10*3/uL (ref 0.1–1.7)
Monocytes: 4.9 % (ref 3.0–15.0)
Neutrophils %: 81.9 % — ABNORMAL HIGH (ref 42.0–78.0)
Neutrophils Absolute: 9.9 10*3/uL — ABNORMAL HIGH (ref 1.7–8.6)
PLT CT: 238 10*3/uL (ref 130–440)
RBC: 4.15 10*6/uL (ref 3.80–5.00)
RDW: 15.7 % — ABNORMAL HIGH (ref 11.0–14.0)
WBC: 12.1 10*3/uL — ABNORMAL HIGH (ref 4.0–11.0)

## 2017-06-18 LAB — VH CELL COUNT BODY FLUID
Body Fluid Eosinophils: 2 %
Body Fluid Lymphocytes: 7 %
Body Fluid Monocytes: 2 %
Body Fluid Neutrophil Count: 89 %
Body Fluid RBC: 1501 /mm3
Total NUCLEATED CELL COUNT: 7423 /mm3

## 2017-06-18 LAB — MAGNESIUM: Magnesium: 1.6 mg/dL (ref 1.6–2.6)

## 2017-06-18 LAB — VH PROTEIN BODY FLUID: Body Fluid Protein: 2.5 gm/dL

## 2017-06-18 LAB — PHOSPHORUS: Phosphorus: 3.6 mg/dL (ref 2.3–4.7)

## 2017-06-18 NOTE — Progress Notes (Signed)
Pt with indwelling RT Pleural Pleurx catheter.  House staff attempted to access.  No success.  Pt brought to [facility].  Pleurx accessed 1L clear yellow fluid aspirated.  Small sample to lab.    *PLEASE BE ADVISED ON INPATIENTS IT IS THE RESPONSIBILITY OF THE PHYSICIAN WHO ORDERED THE PROCEDURE TO ORDER THE LAB WORK ON THE FLUID. RADIOLOGY IS ONLY PROVIDING THE SPECIMEN*      No immediate complications.  If pt needs further drainage the kits are available thru dispatch and this can be accomplished in patients room.  No need for patient to come to MINS/IR.

## 2017-06-18 NOTE — Progress Notes (Signed)
[facility]   [address]   [place] [address]       Case Manager    Patient identified as a High Risk for readmission based on the current risk score  Estimated D/C Date: Per MD   Risk Score: 28%   If risk score above 75%, recommend Care Coordination Consult with Palliative Care to MD: N/A   RX Coverage: Yes   Utilize D/C Medication Program: TBD   Confirmed PCP with patient: name/last visit Dr. Lynnda Shields   Confirmed Transportation to F/u Appt: Family   PCP F/U Appt request is placed in the Navigator: Yes   Consult Chaplain for AD if not present or addressed: Patient states has- MOST form on file in EPIC   Anticipated DME needed for D/C: TBD   Anticipated HH, arrange if appropriate: Open to [facility]   Anticipated Placement:  Referral to DCP: No   Inpatient Plan of Care:    Early sepsis, high grade fever, lactic acidosis- pleural effusion with plurex in place. IR today with one liter clear yellow fluid. PAF with RVR- HR 120s- PO cardizem   CM Interventions:    CM reviewed chart and met with patient. CM spoke with bedside RN- per RN, heavy 1 assist for mobility- nursing states will obtain PT/OT orders. Open to [facility] services. Has home Pleurex drain in place from prior to admission. Patient does not have home O2. Clinical course to determine final [place] needs.         06/18/17 1254   Patient Type   Within 30 Days of Previous Admission? No   Healthcare Decisions   Interviewed: Patient   Orientation/Decision Making Abilities of Patient Alert and Oriented x3, able to make decisions   Advance Directive Patient does not have advance directive  (MOST Form on file)   Prior to admission   Prior level of function Independent with ADLs   Type of Residence Private residence   Home Layout One level  (No steps to enter)   Living Arrangements Children  (Lives with [name])   How do you get to your MD appointments? (daughter)   How do you get your groceries? (daughter)   Who fixes your  meals? (self/daughter)   Who does your laundry? (self/daughter)   Who picks up your prescriptions? (daughter)   Dressing Independent   Grooming Independent   Feeding Independent   Bathing Independent   Toileting Independent   DME Currently at [employer] walker;Other (Comment)  (Pleurex drain)   Home Care/Community Services Skilled nursing   Name of Agency [facility]   Discharge Planning   Support Systems Children   Patient expects to be discharged to: home   Anticipated [place] plan discussed with: Same as interviewed   Mode of transportation: (TBD pending safety at time of [place])   Consults/Providers   Correct PCP listed in Epic? Yes  (Dr. Lynnda Shields)     Suzan Slick. Dorthy Cooler BSN, RN  Geophysicist/field seismologist. [phone]

## 2017-06-18 NOTE — Progress Notes (Signed)
[facility]  [address]  [place], [place] [address]      INPATIENT SERVICE PROGRESS NOTE    Date Time: 06/18/17 8:18 AM  Patient Name: Kristina [name],Kristina [name]  DOB:@, 84 y.o.  MRN:@  Attending Physician: Kerin Salen, MD  Length of stay: 1    Assessment & Plan:   Active Problems:    Pleural effusion    Sepsis  Resolved Problems:    * No resolved hospital problems. *    Early Sepsis  High Grade Fever, Tachycardia,Hypotensive, Lactic Acidosis, Left Shift  Blood UE:AVWUJWJ  Pleural Fluid Studies  Vanc/Zosyn    Pleural effusion  Hx of Parapneumonic effusion, Consider infectious cause due to fever and fluid in [name] lung  ECHO showing both LV failure EF 40% and RV failure with tricuspid regurgitation (12/13)  CXR:The right chest tube has been adjusted, with a mild decrease in the size of the large right pleural effusion  Pleural catheter placed on 06/05/2017  Fluid (Effusion) studies pending  PO lasix 40mg   IR cath consult to drain   Pt uses "Accal Evacuated Drainage" has tube drained twice weekly via home health  Consider Pulmonology consult for Effusion evaluation    PAF (paroxysmal atrial fibrillation) with RVR  HR in 120s  Cardizem po  No anticoagulation due to MRI did show an acute infarction with some hemorrhagic conversion but no mass effect. Repeat CT was stable. Per Dr.Lyons (02/2017)  Consider Neurology Consult for [facility]    Hx of Cerebral infarction with hemorrhagic conversion   Monitor Clinically     Right/Left Congestive Heart Failure  Lasix 40mg     Hypokalemia  Replete as needed    HTN (hypertension)  No anti-hypertensive at this time    HLD  Not on statin since previous admission    Hypothyroidism  continue levothyroxin qd    Rheumatoid Arthritis  -Methotrexate    Depression  Psychiatry rec. Remeron,due to sleepiness dose reduced to 7.5mg  daily    DVT Prophylaxis:  Diet: As tolerated    Code Status: DNR  Service status: INPATIENT   Anticipate discharge in:2  days.    Subjective     CC:  None    HPI/Subjective: Feels a little weak    Review of Systems:     Review of systems as noted in HPI. Full 12 point ROS otherwise negative.    Physical Exam:     Vitals:    06/18/17 0701   BP: (!) 134/96   Pulse: (!) 120   Resp: 18   Temp: 97.9 F (36.6 C)   SpO2: 97%     Body mass index is 19.64 kg/m.     Intake/Output Summary (Last 24 hours) at 06/18/17 0818  Last data filed at 06/18/17 0552   Gross per 24 hour   Intake              200 ml   Output              450 ml   Net             -250 ml       Physical Exam  Constitutional: She is oriented to person, place, and time. She appears well-developedand well-nourished. No distress.   HENT:   Head: Normocephalicand atraumatic.   Eyes: Pupils are equal, round, and reactive to light. Conjunctivaeare normal.   Neck: Normal range of motion. Neck supple. No JVDpresent.   Cardiovascular: Intact distal pulses. An irregularly irregular  rhythmpresent. Labile Bps.  Pulmonary/Chest: Effort normal. diminished right side lung sounds RLL/RML  There is a right lateral pleural catheter in place without erythema, clear drainage present in tube.  Abdominal: Soft. There is no tenderness.   Musculoskeletal: She exhibits no edema.   Neurological: She is alertand oriented to person, place, and time.   Skin: Capillary refill takes less than 2 seconds.   Psychiatric: She has a normal mood and affect. Her behavior is normal.    Meds:     Current Facility-Administered Medications   Medication Dose Route Frequency   . aspirin  81 mg Oral Daily   . brimonidine-timolol  1 drop Right Eye BID   . dilTIAZem  240 mg Oral QAM   . docusate sodium  100 mg Oral Daily   . enoxaparin  30 mg Subcutaneous Q24H   . folic acid  1 mg Oral Daily   . furosemide  40 mg Oral Daily   . lactobacillus species  50 Billion CFU Oral Daily   . latanoprost  1 drop Right Eye QHS   . levothyroxine  88 mcg Oral Daily at 0600   . magnesium oxide  400 mg Oral Daily   . [START ON 06/24/2017] methotrexate  10 mg Oral  Weekly   . mirtazapine  7.5 mg Oral QHS   . piperacillin-tazobactam  2.25 g Intravenous Q6H   . predniSONE  5 mg Oral Daily   . senna-docusate  2 tablet Oral QHS   . sodium chloride (PF)  3 mL Intravenous Q8H   . vancomycin  500 mg Intravenous Q24H   . vancomycin therapy placeholder   Does not apply See Admin Instructions     acetaminophen **OR** acetaminophen **OR** acetaminophen, albuterol, fluticasone, naloxone, nitroglycerin      Labs:     RECENT LABS (from the past 7 days)    Recent Labs  Lab 06/17/17  1330   WBC 9.2   RBC 4.16   Hemoglobin 13.8   Hematocrit 42.4   MCV 102*   PLT CT 256       Recent Labs  Lab 06/17/17  1330   aPTT 25.5          Recent Labs  Lab 06/17/17  1337   i-STAT Creatinine 0.90   EGFR 58*       Recent Labs  Lab 06/17/17  1330   Albumin 2.7*   Protein, Total 5.9*   Bilirubin, Total 0.7   Alkaline Phosphatase 81   ALT 16   AST (SGOT) 40      Recent Labs  Lab 06/17/17  1430   Specific Gravity, UR 1.023   pH, Urine 5.0   Protein, UR 30*   Glucose, UA Negative   Ketones UA Negative   Bilirubin, UA Negative   Blood, UA Small*   Nitrite, UA Negative   Urobilinogen, UA Normal   Leukocyte Esterase, UA Negative   WBC, UA 2   RBC, UA 1      Lab Results   Component Value Date    HGBA1CPERCNT 5.7 02/24/2017              Radiology:     Xr Chest Ap Portable    Result Date: 06/17/2017  The right chest tube has been adjusted, with a mild decrease in the size of the large right pleural effusion. There is no pneumothorax. ReadingStation:[id number]    Signed by:     Isac Caddy, MD  Family Medicine Resident, PGY-1

## 2017-06-18 NOTE — Progress Notes (Signed)
Hr elevated, cardizem given

## 2017-06-18 NOTE — Progress Notes (Signed)
Pharmacy Vancomycin Dosing Consult Note  Kristina [name]    Assessment:   1. Day #2 vancomycin/zosyn in an 18 yoF transfer from [place] with early sepsis, fever, tachycardia, hypotension, lactic acidosis, right lung pleural effusion.   2. Wbc 12.1, renal stable, febrile with Tmax 101.61F, blood culture with Group C Strep.     Plan:   1. Continue Vancomycin 500 mg IV q24h  2. Trough level TBD  3. Pharmacy will follow the patient's renal function, vancomycin levels, and dosing during the course of therapy. If you have any questions, please contact the pharmacist at [phone].     Indication: early sepsis    Age: 84 y.o.  Height: 1.6 m (5\' 3" )  Weight:  50.3 kg (110 lb 14.3 oz)  IBW: 52.4 kg  DW: 50.3 kg    CrCl: 36.7 ml/min     Population Estimate Pharmacokinetics:   Kel: 0.035 hr-1        t50: 20 hrs        Vd: 35.2 L         Expected Trough: 13 mg/L         0.432  695      Date Regimen Trough Date/Time Trough Level Pt Specific Ke   06/17/17 1 gm IV x 1 dose      06/18/17 500 mg IV q24h                        Recent Labs  Lab 06/18/17  0835 06/17/17  1337   Creatinine 0.89  --    i-STAT Creatinine  --  0.90   BUN 23*  --        Recent Labs  Lab 06/18/17  0835 06/17/17  1330   WBC 12.1* 9.2     Temp (24hrs), Avg:99.6 F (37.6 C), Min:97.9 F (36.6 C), Max:101.3 F (38.5 C)      Cultures:       Date Source Organism Sensitivities Resistance   4/2 blood      4/2 nares No MRSA detected     4/2 urine Less than 10,000   GP Flora     4/2 Blood Group C Strep

## 2017-06-18 NOTE — Plan of Care (Signed)
Problem: Moderate/High Fall Risk Score >5  Goal: Patient will remain free of falls  Outcome: Progressing

## 2017-06-18 NOTE — Progress Notes (Signed)
Positive blood culture 1/2 bottles with gram positive cocci in chains called to Dr Darin Engels, no new orders recieved

## 2017-06-18 NOTE — Progress Notes (Signed)
Temp spiked beginning of shift, tylenol given and effective to bring temp down, rest of shift uneventful, resting quietly, will moniter

## 2017-06-18 NOTE — Plan of Care (Signed)
Problem: Inadequate Tissue Perfusion  Goal: Adequate tissue perfusion will be maintained  Outcome: Progressing   06/18/17 1836   Goal/Interventions addressed this shift   Adequate tissue perfusion will be maintained Monitor/assess vital signs;Monitor/assess lab values and report abnormal values;Monitor/assess for signs of VTE (edema of calf/thigh redness, pain);Monitor for signs and symptoms of a pulmonary embolism (dyspnea, tachypnea, tachycardia, confusion);Increase mobility as tolerated/progressive mobility       Problem: Impaired Mobility  Goal: Mobility/Activity is maintained at optimal level for patient  Outcome: Progressing   06/18/17 1836   Goal/Interventions addressed this shift   Mobility/activity is maintained at optimal level for patient Increase mobility as tolerated/progressive mobility;Plan activities to conserve energy, plan rest periods;Reposition patient every 2 hours and as needed unless able to reposition self       Pt up to bsc with one assist this shift. Pt assisted to sit at the side of the bed. Pt able to maintain air way. Oxygen weaned down to 3 liters nasal cannula. Hr remaining in the low 100's with medication. Will continue to monitor.

## 2017-06-18 NOTE — Progress Notes (Signed)
Pt has pleurex catheter in place.    Unable to access in ER.  Equipment to access the catheter is available through dispatch.    Plan to bring to IR for drainage.

## 2017-06-19 ENCOUNTER — Inpatient Hospital Stay: Payer: Medicare Other

## 2017-06-19 LAB — CBC AND DIFFERENTIAL
Basophils %: 0.6 % (ref 0.0–3.0)
Basophils Absolute: 0.1 10*3/uL (ref 0.0–0.3)
Eosinophils %: 0.6 % (ref 0.0–7.0)
Eosinophils Absolute: 0.1 10*3/uL (ref 0.0–0.8)
Hematocrit: 42.8 % (ref 36.0–48.0)
Hemoglobin: 13.1 gm/dL (ref 12.0–16.0)
Lymphocytes Absolute: 1.7 10*3/uL (ref 0.6–5.1)
Lymphocytes: 15.5 % (ref 15.0–46.0)
MCH: 32 pg (ref 28–35)
MCHC: 31 gm/dL — ABNORMAL LOW (ref 32–36)
MCV: 106 fL — ABNORMAL HIGH (ref 80–100)
MPV: 8.7 fL (ref 6.0–10.0)
Monocytes Absolute: 0.2 10*3/uL (ref 0.1–1.7)
Monocytes: 1.9 % — ABNORMAL LOW (ref 3.0–15.0)
Neutrophils %: 81.4 % — ABNORMAL HIGH (ref 42.0–78.0)
Neutrophils Absolute: 8.8 10*3/uL — ABNORMAL HIGH (ref 1.7–8.6)
PLT CT: 226 10*3/uL (ref 130–440)
RBC: 4.05 10*6/uL (ref 3.80–5.00)
RDW: 15.9 % — ABNORMAL HIGH (ref 11.0–14.0)
WBC: 10.8 10*3/uL (ref 4.0–11.0)

## 2017-06-19 LAB — BASIC METABOLIC PANEL
Anion Gap: 11.7 mMol/L (ref 7.0–18.0)
Anion Gap: 13.7 mMol/L (ref 7.0–18.0)
BUN / Creatinine Ratio: 25.5 Ratio (ref 10.0–30.0)
BUN / Creatinine Ratio: 26.5 Ratio (ref 10.0–30.0)
BUN: 24 mg/dL — ABNORMAL HIGH (ref 7–22)
BUN: 22 mg/dL (ref 7–22)
CO2: 28.2 mMol/L (ref 20.0–30.0)
CO2: 27.1 mMol/L (ref 20.0–30.0)
Calcium: 8.9 mg/dL (ref 8.5–10.5)
Calcium: 9.1 mg/dL (ref 8.5–10.5)
Chloride: 100 mMol/L (ref 98–110)
Chloride: 99 mMol/L (ref 98–110)
Creatinine: 0.94 mg/dL (ref 0.60–1.20)
Creatinine: 0.83 mg/dL (ref 0.60–1.20)
EGFR: 55 mL/min/{1.73_m2} — ABNORMAL LOW (ref 60–150)
EGFR: 65 mL/min/{1.73_m2} (ref 60–150)
Glucose: 84 mg/dL (ref 71–99)
Glucose: 95 mg/dL (ref 71–99)
Osmolality Calc: 275 mOsm/kg (ref 275–300)
Osmolality Calc: 275 mOsm/kg (ref 275–300)
Potassium: 3.9 mMol/L (ref 3.5–5.3)
Potassium: 3.8 mMol/L (ref 3.5–5.3)
Sodium: 136 mMol/L (ref 136–147)
Sodium: 136 mMol/L (ref 136–147)

## 2017-06-19 LAB — CBC
Hematocrit: 44.2 % (ref 36.0–48.0)
Hemoglobin: 13.9 gm/dL (ref 12.0–16.0)
MCH: 33 pg (ref 28–35)
MCHC: 31 gm/dL — ABNORMAL LOW (ref 32–36)
MCV: 104 fL — ABNORMAL HIGH (ref 80–100)
MPV: 9 fL (ref 6.0–10.0)
PLT CT: 237 10*3/uL (ref 130–440)
RBC: 4.23 10*6/uL (ref 3.80–5.00)
RDW: 15.7 % — ABNORMAL HIGH (ref 11.0–14.0)
WBC: 11.6 10*3/uL — ABNORMAL HIGH (ref 4.0–11.0)

## 2017-06-19 LAB — MAGNESIUM: Magnesium: 1.9 mg/dL (ref 1.6–2.6)

## 2017-06-19 LAB — PHOSPHORUS: Phosphorus: 3.6 mg/dL (ref 2.3–4.7)

## 2017-06-19 MED ORDER — STERILE WATER FOR INJECTION IJ SOLN
2.00 g | INTRAMUSCULAR | Status: DC
Start: 2017-06-19 — End: 2017-06-23
  Administered 2017-06-19 – 2017-06-22 (×4): 2 g via INTRAVENOUS
  Filled 2017-06-19: qty 2000
  Filled 2017-06-19: qty 20
  Filled 2017-06-19: qty 2000
  Filled 2017-06-19: qty 20
  Filled 2017-06-19: qty 2000
  Filled 2017-06-19: qty 20
  Filled 2017-06-19: qty 2000
  Filled 2017-06-19: qty 20

## 2017-06-19 MED ORDER — APIXABAN 2.5 MG PO TABS
2.50 mg | ORAL_TABLET | Freq: Two times a day (BID) | ORAL | Status: DC
Start: 2017-06-19 — End: 2017-06-23
  Administered 2017-06-19 – 2017-06-23 (×8): 2.5 mg via ORAL
  Filled 2017-06-19 (×9): qty 1

## 2017-06-19 NOTE — Progress Notes (Signed)
Assumed care of pt at 1900 last pm, vss, resting quietly,no acute distress noted

## 2017-06-19 NOTE — Progress Notes (Signed)
06/19/17 1621   Case Management Quick Doc   Case Management Assessment Status Assessment Complete   CM Comments 06/19/17 RNCM: Admitted w/ early sepsis, high grade fever, lactic acidosis, pleural effusion w/ pleurx in place PTA. Weaned to RA. Pt did well w/ PT/OT today. Pt open to [facility], [facility] order in place. Anticipating [place] home w/ family support. CM following.   Elsie Amis RN, BSN  Nurse Case Manager  [facility]  [phone]

## 2017-06-19 NOTE — Progress Notes (Signed)
[facility]  [address]  [place], [place] [address]      INPATIENT SERVICE PROGRESS NOTE    Date Time: 06/19/17 8:16 AM  Patient Name: Kristina [name], Kristina [name]  DOB:@, 84 y.o.  MRN:@  Attending Physician: Kerin Salen, MD  Length of stay: 2    Assessment & Plan:   Active Problems:    Pleural effusion    Sepsis  Resolved Problems:    * No resolved hospital problems. *    Early Sepsis  High Grade Fever, Tachycardia,Hypotensive,Lactic Acidosis, Left Shift  Blood UE:AVWUJ C strep  Pleural Fluid studies consistent with Group C strep  Vanc/Zosyn transitioned to Rocephin 2g   Discussed with Dr. Ovidio Kin, no additional therapy needed at this time.    Pleural effusion  Hx of Parapneumonic effusion, Consider infectious cause due to fever and fluid in R lung  ECHO showing both LV failure EF 40% and RV failure withtricuspid regurgitation (12/13)  CXR:The right chest tube has been adjusted, with a mild decrease in the size of the large right pleural effusion  Pleural catheter placed on 06/05/2017  IR cath consult to drain  Consider Pulm Consult    PAF (paroxysmal atrial fibrillation) with RVR  HR in 120s  Cardizem po  No anticoagulation due to MRI did show an acute infarction with some hemorrhagic conversion but no mass effect. Repeat CT was stable. Per Dr.Lyons (02/2017)  Consider Neurology Consult for [facility]    Hx of Cerebral infarction with hemorrhagic conversion   Monitor Clinically     Right/Left Congestive Heart Failure  Lasix 40mg     Hypokalemia Resolved  Replete as needed    HTN (hypertension)  No anti-hypertensive at this time    HLD  Not on statin since previous admission    Hypothyroidism  continue levothyroxin qd    Rheumatoid Arthritis  -Methotrexate    Depression  Psychiatry rec. Remeron,due to sleepiness dose reduced to 7.5mg  daily    DVT Prophylaxis:  Diet: As tolerated    Code Status: DNR  Service status: INPATIENT   Anticipate discharge in:2 days.        Subjective     CC:  Fatigue  HPI/Subjective:Ir drained 1L of clear fluid.  Pt was placed on oxygen weaned to 1L.  Pt feels well this morning is Is emphasizing catheter care at this time she is worried about it getting infected in the future. Feels like her normal self was seen sitting in a chair, discussed removing Foley.    Review of Systems:     Review of systems as noted in HPI. Full 12 point ROS otherwise negative.    Physical Exam:     Vitals:    06/19/17 0711   BP: 91/54   Pulse: 75   Resp: 18   Temp: 97.3 F (36.3 C)   SpO2: 100%     Body mass index is 19.64 kg/m.     Intake/Output Summary (Last 24 hours) at 06/19/17 0816  Last data filed at 06/19/17 0300   Gross per 24 hour   Intake                0 ml   Output              575 ml   Net             -575 ml       Physical Exam  Constitutional: She is oriented to person, place, and time. She appears  well-developedand well-nourished. No distress.   HENT:   Head: Normocephalicand atraumatic.   Eyes: Pupils are equal, round, and reactive to light. Conjunctivaeare normal.   Neck: Normal range of motion. Neck supple. No JVDpresent.   Cardiovascular: Intact distal pulses. An irregularly irregular rhythmpresent. Labile Bps.  Pulmonary/Chest: Effort normal. Improved lung sounds overall especially overlying the RLL/RML  There is a right lateral pleural catheter in place without erythema, no drainage present in tube this a.m.  Abdominal: Soft. There is no tenderness.   Musculoskeletal: She exhibits no edema.   Neurological: She is alertand oriented to person, place, and time.   Skin: Capillary refill takes less than 2 seconds.   Psychiatric: She has a normal mood and affect. Her behavior is normal.    Meds:     Current Facility-Administered Medications   Medication Dose Route Frequency   . aspirin  81 mg Oral Daily   . brimonidine-timolol  1 drop Right Eye BID   . dilTIAZem  240 mg Oral QAM   . docusate sodium  100 mg Oral Daily   . enoxaparin  30 mg Subcutaneous Q24H   . folic  acid  1 mg Oral Daily   . furosemide  40 mg Oral Daily   . lactobacillus species  50 Billion CFU Oral Daily   . latanoprost  1 drop Right Eye QHS   . levothyroxine  88 mcg Oral Daily at 0600   . magnesium oxide  400 mg Oral Daily   . [START ON 06/24/2017] methotrexate  10 mg Oral Weekly   . mirtazapine  7.5 mg Oral QHS   . piperacillin-tazobactam  2.25 g Intravenous Q6H   . predniSONE  5 mg Oral Daily   . senna-docusate  2 tablet Oral QHS   . sodium chloride (PF)  3 mL Intravenous Q8H   . vancomycin  500 mg Intravenous Q24H   . vancomycin therapy placeholder   Does not apply See Admin Instructions     acetaminophen **OR** acetaminophen **OR** acetaminophen, albuterol, fluticasone, naloxone, nitroglycerin      Labs:     RECENT LABS (from the past 7 days)    Recent Labs  Lab 06/18/17  0835 06/17/17  1330   WBC 12.1* 9.2   RBC 4.15 4.16   Hemoglobin 13.4 13.8   Hematocrit 43.5 42.4   MCV 106* 102*   PLT CT 238 256       Recent Labs  Lab 06/17/17  1330   aPTT 25.5          Recent Labs  Lab 06/18/17  0835 06/17/17  1337   Glucose 103*  --    Sodium 138  --    Potassium 3.5  --    Chloride 101  --    CO2 28.6  --    BUN 23*  --    Creatinine 0.89  --    i-STAT Creatinine  --  0.90   EGFR 59* 58*   Calcium 8.7  --        Recent Labs  Lab 06/18/17  0835 06/17/17  1330   Magnesium 1.6  --    Phosphorus 3.6  --    Albumin  --  2.7*   Protein, Total  --  5.9*   Bilirubin, Total  --  0.7   Alkaline Phosphatase  --  81   ALT  --  16   AST (SGOT)  --  40      Recent Labs  Lab 06/17/17  1430   Specific Gravity, UR 1.023   pH, Urine 5.0   Protein, UR 30*   Glucose, UA Negative   Ketones UA Negative   Bilirubin, UA Negative   Blood, UA Small*   Nitrite, UA Negative   Urobilinogen, UA Normal   Leukocyte Esterase, UA Negative   WBC, UA 2   RBC, UA 1      Lab Results   Component Value Date    HGBA1CPERCNT 5.7 02/24/2017              Radiology:     Xr Chest Ap Portable    Result Date: 06/17/2017  The right chest tube has been adjusted, with  a mild decrease in the size of the large right pleural effusion. There is no pneumothorax. ReadingStation:[id number]          Signed by:     Isac Caddy, MD  Family Medicine Resident, PGY-1

## 2017-06-19 NOTE — Progress Notes (Signed)
Voided after foley removal. VSS. No distress noted. Progressing.

## 2017-06-19 NOTE — UM Notes (Signed)
Kristina [name], Kristina [name]  DOB:   February 19, 1930, 84 y.o., Female  MRN:   [id number]  CSN:   [id number]      UR REVIEW     Diagnosis   Febrile illness, acute         PER MD 06-17-2017   History of Presenting Illness:                                                          [facility] Kristina [name] is a 84 y.o. female who presents to the hospital c/o of Fever transferred from [facility] for evaluation.  Patient has no complaints at bedside, states she feels fine and just had some green tea.  Patient has no specific complaints including no chest pain, cough, abdominal pain, vomiting, diarrhea, headache, sore throat, urinary symptoms or rashes.    Pt mentioned that she has paramedics/home health nurses that come twice weekly to drain her [place] pleural catheter, the device used is a Accel drainage tube.    CC:       Chief Complaint   Patient presents with   . Fever     ASSESSMENT                                                                                   [facility] Hospitalists   Active Problems:    Pleural Efussion    PLAN                                                                                                   [facility] Hospitalists   Early Sepsis  High Grade Fever, Tachycardia,Hypotensive, Lactic Acidosis, Left Shift  Blood WG:NFAOZHY  Pleural Fluid Studies  Vanc/Zosyn    Pleural effusion  Likely Parapneumonic effusion, Can't rule out infectious cause due to fever and fluid in R lung  ECHO showing both LV failure EF 40% and RV failure with tricuspid regurgitation (12/13)  CXR:The right chest tube has been adjusted, with a mild decrease in the size of the large right pleural effusion  Pleural catheter placed on 06/05/2017  Fluid (Effusion) studies pending  PO lasix 40mg   IR cath consult to drain   Pt uses "Accal Evacuated Drainage" has tube drained twice weekly via home health  Consider Pulmonology consult for Effusion evaluation    PAF (paroxysmal atrial fibrillation) with RVR  HR in 120s  Cardizem po  No  anticoagulation due to MRI did show an acute infarction with some hemorrhagic conversion but no mass effect. Repeat CT was stable. Per Dr.Lyons (02/2017)  Neurology  Consult for [facility]    Hx of Cerebral infarction with hemorrhagic conversion   Monitor Clinically     Right/Left Congestive Heart Failure  Lasix 40mg     Hypokalemia  Replete as needed    HTN (hypertension)  No anti-hypertensive at this time    HLD  Not on statin since previous admission    Hypothyroidism  continue levothyroxin qd    Rheumatoid Arthritis  -Methotrexate    Depression  Psychiatry rec. Remeron,due to sleepiness dose reduced to 7.5mg  daily    DVT Prophylaxis:  Diet: As tolerated    Code Status: DNR  Service status: INPATIENT   Anticipate discharge in:2  days.    atient Vitals for the past 24 hrs:   BP Temp Temp src Pulse Resp SpO2 Weight   06/17/17 1524 - 100.3 F (37.9 C) Rectal - - - -   06/17/17 1452 - - - 100 (!) 28 95 % -   06/17/17 1432 - - - 96 (!) 31 96 % -   06/17/17 1422 103/64 - - (!) 121 22 91 % -   06/17/17 1409 93/68 - - (!) 119 (!) 25 95 % -   06/17/17 1332 96/61 - - (!) 116 (!) 25 99 % -   06/17/17 1316 100/70 - - (!) 132 (!) 29 - -   06/17/17 1311 - - - - - 94 % -   06/17/17 1309 112/77 - - (!) 122 (!) 34 92 % -   06/17/17 1308 - (!) 101.7 F (38.7 C) Rectal - - - -   06/17/17 1256 93/70 (!) 101.6 F (38.7 C) Tympanic (!) 141 22 - 50.9 kg (112 lb 3.4 oz)     Body mass index is 19.88 kg/m.  No intake or output data in the 24 hours ending 06/17/17 1616    Physical Exam  Constitutional: She is oriented to person, place, and time. She appears well-developedand well-nourished. No distress.   HENT:   Head: Normocephalicand atraumatic.   Eyes: Pupils are equal, round, and reactive to light. Conjunctivaeare normal.   Neck: Normal range of motion. Neck supple. No JVDpresent.   Cardiovascular: Intact distal pulses. An irregularly irregular rhythmpresent. Labile Bps.  Pulmonary/Chest: Effort normal. diminished  right side lung sounds RLL/RML  There is a right lateral pleural catheter in place without erythema, clear drainage present in tube.  Abdominal: Soft. There is no tenderness.   Musculoskeletal: She exhibits no edema.   Neurological: She is alertand oriented to person, place, and time.   Skin: Capillary refill takes less than 2 seconds.   Psychiatric: She has a normal mood and affect. Her behavior is normal.    EKG    --       Atrial fibrillation  Borderline left axis deviation  Low voltage, extremity leads       XR Chest AP Portable   Final Result   The right chest tube has been adjusted, with a mild decrease in the size of the large right pleural effusion. There is no pneumothorax.     PER MD 06-18-2017  Assessment & Plan:   Active Problems:    Pleural effusion    Sepsis  Resolved Problems:    * No resolved hospital problems. *    Early Sepsis  High Grade Fever, Tachycardia,Hypotensive,Lactic Acidosis, Left Shift  Blood ZO:XWRUEAV  Pleural Fluid Studies  Vanc/Zosyn    Pleural effusion  Hx of Parapneumonic effusion, Consider infectious cause due to fever and fluid in  R lung  ECHO showing both LV failure EF 40% and RV failure withtricuspid regurgitation (12/13)  CXR:The right chest tube has been adjusted, with a mild decrease in the size of the large right pleural effusion  Pleural catheter placed on 06/05/2017  Fluid (Effusion) studies pending  PO lasix 40mg   IR cath consult to drain  Pt uses "Accal Evacuated Drainage" has tube drained twice weekly via home health  Consider Pulmonology consult for Effusion evaluation    PAF (paroxysmal atrial fibrillation) with RVR  HR in 120s  Cardizem po  No anticoagulation due to MRI did show an acute infarction with some hemorrhagic conversion but no mass effect. Repeat CT was stable. Per Dr.Lyons (02/2017)  Consider Neurology Consult for [facility]    Hx of Cerebral infarction with hemorrhagic conversion   Monitor Clinically     Right/Left Congestive Heart Failure  Lasix 40mg      Hypokalemia  Replete as needed    HTN (hypertension)  No anti-hypertensive at this time    HLD  Not on statin since previous admission    Hypothyroidism  continue levothyroxin qd    Rheumatoid Arthritis  -Methotrexate    Depression  Psychiatry rec. Remeron,due to sleepiness dose reduced to 7.5mg  daily    DVT Prophylaxis:  Diet: As tolerated    Code Status: DNR  Service status: INPATIENT   Anticipate discharge in:2 days.    Vitals       Vitals:    06/18/17 0701   BP: (!) 134/96   Pulse: (!) 120   Resp: 18   Temp: 97.9 F (36.6 C)   SpO2: 97%        Body mass index is 19.64 kg/m.     Intake/Output Summary (Last 24 hours) at 06/18/17 0818  Last data filed at 06/18/17 0552   Gross per 24 hour   Intake              200 ml   Output              450 ml   Net             -250 ml     Cardiovascular: Intact distal pulses. An irregularly irregular rhythmpresent. Labile Bps.  Pulmonary/Chest: Effort normal. diminished right side lung sounds RLL/RML  There is a right lateral pleural catheter in place without erythema, clear drainage present in tube.    06-18-2017 PER RADIOLOGY  Pt with indwelling RT Pleural Pleurx catheter.  House staff attempted to access.  No success.  Pt brought to [facility].  Pleurx accessed 1L clear yellow fluid aspirated.  Small sample to lab.      PER CM 06-18-2017   Inpatient Plan of Care:    Early sepsis, high grade fever, lactic acidosis- pleural effusion with plurex in place. IR today with one liter clear yellow fluid. PAF with RVR- HR 120s- PO cardizem   CM Interventions:    CM reviewed chart and met with patient. CM spoke with bedside RN- per RN, heavy 1 assist for mobility- nursing states will obtain PT/OT orders. Open to [facility] services. Has home Pleurex drain in place from prior to admission. Patient does not have home O2. Clinical course to determine final [place] needs.    OPEN TO [facility]      04/02 1330 04/03 0835 04/04 1610   Sodium      138      136  Potassium      3.5     3.9       Chloride      101     100       Carbon Dioxide, Whole Blood      28.6     28.2       BUN      23     24       Creatinine      0.89     0.94       Hemoglobin 13.8     13.4     13.1       Hematocrit 42.4     43.5     42.8       WBC 9.2     12.1     10.8       Glucose      103     84        Lines/Drains/Airways     PIV Line     Peripheral IV 06/17/17 Left Forearm 1 day      Drain     Urethral Catheter -- days   Closed/Suction Drain Right Chest Other (Comment) 15.5 Fr. 14         Scheduled     Medication Dose/Rate, Route, Frequency Last Action   aspirin chewable tablet 81 mg 81 mg, PO, Daily Given: 04/04 0938   brimonidine-timolol (COMBIGAN) 0.2-0.5 % ophthalmic solution 1 drop 1 drop, RIGHT EYE, BID Given: 04/04 0938   cefTRIAXone (ROCEPHIN) 2 g in sterile water (preservative free) 20 mL Injection 2 g, IV, Q24H Ordered   dilTIAZem (CARDIZEM CD) 24 hr capsule 240 mg 240 mg, PO, QAM Given: 04/03 0733   docusate sodium (COLACE) capsule 100 mg 100 mg, PO, Daily Given: 04/04 0938   enoxaparin (LOVENOX) syringe 30 mg 30 mg, SC, Q24H Given: 04/03 2149   folic acid (FOLVITE) tablet 1 mg 1 mg, PO, Daily Given: 04/04 0938   furosemide (LASIX) tablet 40 mg 40 mg, PO, Daily Given: 04/03 1022   lactobacillus species (BIO-K PLUS) capsule 50 Billion CFU 50 Billion CFU, PO, Daily Given: 04/04 0938   latanoprost (XALATAN) 0.005 % ophthalmic solution 1 drop 1 drop, RIGHT EYE, QHS Given: 04/03 2149   levothyroxine (SYNTHROID, LEVOTHROID) tablet 88 mcg 88 mcg, PO, Daily at 0600 Given: 04/04 0602   magnesium oxide (MAG-OX) tablet 400 mg 400 mg, PO, Daily Given: 04/04 1610   methotrexate tablet 10 mg 10 mg, PO, Weekly Ordered   mirtazapine (REMERON) tablet 7.5 mg 7.5 mg, PO, QHS Given: 04/03 2150   predniSONE (DELTASONE) tablet 5 mg 5 mg, PO, Daily Given: 04/04 0938   senna-docusate (PERICOLACE) 8.6-50 MG per tablet 2 tablet 2 tablet, PO, QHS Given: 04/03 2150   sodium chloride (PF) 0.9 % injection  3 mL 3 mL, IV, Q8H Given: 04/04 0603      PRN     Medication Dose/Rate, Route, Frequency Last Action   acetaminophen (TYLENOL) 160 MG/5ML oral solution 650 mg No Dose/Rate, PER NG TUBE, Q4H PRN See Alternative: 04/03 1402   acetaminophen (TYLENOL) suppository 650 mg No Dose/Rate, RE, Q4H PRN See Alternative: 04/03 1402   acetaminophen (TYLENOL) tablet 650 mg 650 mg, PO, Q4H PRN Given: 04/03 1402   albuterol (PROVENTIL HFA;VENTOLIN HFA) inhaler 2 puff 2 puff, IN, Q6H PRN Ordered   fluticasone (FLONASE) 50 MCG/ACT nasal spray 1 spray 1 spray, EACH NARE, Daily PRN Given: 04/03 2149  naloxone (NARCAN) injection 0.4 mg 0.4 mg, IV, PRN Ordered   nitroglycerin (NITROSTAT) SL tablet 0.4 mg 0.4 mg, SL, Q5 Min PRN Ordered          Halford Decamp, RN, BSN   Utilization Review     Desk: [phone]  Fax:   [phone]

## 2017-06-19 NOTE — OT Eval Note (Signed)
[facility]: [facility]  Department of Rehabilitation Services: [phone]    Kristina [name]    CSN#: [id number]  [facility] MEDICAL OVERFLOW [id number]    Occupational Therapy Evaluation    Consult received for Kristina [name] for OT Evaluation and Treatment.  Patient's medical condition is appropriate for Occupational therapy intervention at this time.    Time of treatment:   Time Calculation  OT Received On: 06/19/17  Start Time: 0902  Stop Time: 0924  Time Calculation (min): 22 min    Visit#: 1    Precautions and Contraindications:   Falls  Mobility protocol     OT Assessment/Clinical Decision Making:      Kristina [name] is a 84 y.o. female admitted 06/17/2017 presenting with fever- diagnosis with sepsis, pleural effusion. Patient is currently functioning at baseline. No further acute OT needs at this time. Will d/c from OT services.    Rehabilitation Potential: Good With family     Risks/benefits/POC discussed: Patient    Plan:   Treatment/interventions: no skilled interventions needed at this time    Goals:   D/C     DISCHARGE RECOMMENDATIONS   DME recommended for Discharge:   Shower chair    Discharge Recommendations:   Home with no needs         Medical & Therapy History:   Medical Diagnosis: Febrile illness, acute [R50.9]    X-Rays/Tests/Labs:  Xr Chest Ap Portable    Result Date: 06/17/2017  The right chest tube has been adjusted, with a mild decrease in the size of the large right pleural effusion. There is no pneumothorax. ReadingStation:[id number]    Discussed with patient/family/caregiver the patient's physical, cognitive and/or psychosocial history related to current functional performance: yes    Previous therapy services: No    Patient Active Problem List   Diagnosis   . Hematochezia   . PAF (paroxysmal atrial fibrillation)   . HTN (hypertension)   . Hypokalemia   . Hypothyroidism   . Chronic systolic (congestive) heart failure   . Cerebrovascular accident (CVA) with intracranial hemorrhage    . Acute CVA (cerebrovascular accident)   . Pleural effusion due to congestive heart failure   . Pleural effusion   . Sepsis        Past Medical/Surgical History:  Past Medical History:   Diagnosis Date   . A-fib    . Aphasia    . Arrhythmia     afib   . Bradycardia    . Cerebral infarction    . Chest pain    . Congestive heart failure    . Diarrhea    . Disorder of thyroid     hypo   . Hyperlipidemia    . Hypertension    . Melanoma    . Rheumatoid arthritis    . Vitamin D deficiency       Past Surgical History:   Procedure Laterality Date   . arm surgery      roght   . HIP SURGERY      left         Occupational Profile:   Home Living Arrangements  Living Arrangements: Children  Assistance Available: Part time  Type of Home: House  Home Layout: One level, with no stairs to enter  Bathroom:  standard toilet;  tub/shower unit, grab bars in shower  DME Currently at Home: Front wheeled walker    Prior Level of Function  Mobility: Mobility:  Independent with  No assistive device  Fall History: Denies any falls in past year     Activities of Daily Living  Patient was independent with all ADLs    Instrumental Activities of Daily Living  Meal preparation & cleanup: Light meal prep  Home establishment & maintenance: Cleaning: Light housework    Work  Retired    Advertising account planner for OT: return to [employer] concerns & priorities: return to [employer]    Subjective:   "I would like to brush my teeth."  Patient is agreeable to participation in the therapy session. Nursing clears patient for therapy.    Pain:  At Rest: 0 /10  With Activity: 0/10  Location: N/A  Interventions: None required    ASSESSMENT OF OCCUPATIONAL PERFORMANCE:   Observation of Patient:    Patient is seated on the bedside commode with telemetry, O2 at 1 liters/minute via nasal cannula, indwelling urinary catheter            Vital Signs:   SpO2 at rest: 92% room air  SpO2 with activity: 94-98% room air     Oriented to: Oriented x4  Command  following: Follows ALL commands and directions without difficulty  Alertness/Arousal: Appropriate responses to stimuli   Attention Span:Appears intact  Memory: Appears intact  Safety Awareness: Independent  Insights: Fully aware of deficits  Problem Solving: Able to problem solve independently  Behavior: Cooperative  Hand dominance: Right handed    Musculoskeletal Examination:   Range of motion:  Bilateral UE: WFL except right shoulder movement chronic limited- patient demostrated compensation techniques       Strength:  Bilateral UE: Grossly 4-/5 push/pull/grip         Sensory/Oculomotor Examination:   Auditory:  WFL=intact  Tactile-Light Touch:  WFL=intact  Visual Acuity:  wears glasses           Activities of Daily Living:   Eating:  Independent, Simulated;   Grooming: Independent, wash/dry hands, wash/dry face, teeth care, denture care, standing at sink  UB dressing:  Modified Independent with increased time, Simulated  LB dressing: Independent, pull up over hips, pull down from hips, don/doff R sock, don/doff L sock, seated in a chair, standing  Bathing:  Supervision/Set up, Simulated;  Toileting:  Independent;, clothing management up, perineal hygiene, bedside commode       Functional Mobility:     Transfers:  Sit to Stand:  Modified independence  with Front wheeled walker.        Stand to Sit:  Modified independence .        BSC: Modified independence  with Front wheeled walker.        Functional mobility/ambulation: Modified independence  with Front wheeled walker.      Balance:   Static Sitting:  Good  Dynamic Sitting:  Good  Static Standing:  Fair+  Dynamic Standing:  Fair    Participation and Activity Tolerance   Participation effort: Good  Activity Tolerance: Tolerates 10-20 minutes of activity without rest breaks    Other Treatment Interventions:   Treatment Activities:   Evaluation          Education Provided:   Topics: Role of occupational therapy, plan of care, goals of therapy and safety with  mobility and ADLs, benefits of activity, activity with nursing.    Individuals educated: Patient.  Method: Explanation.  Response to education: Verbalized understanding.    Team Communication:   OT communicated with: RN/LPN Archie Patten  OT communicated regarding: Pre-session re: patient status, Patient position at  end of session, Patient participation with Therapy, Vital signs  OT/COTA communication: via written note and verbal communication as needed.    Sitting, in a chair, Needs in reach and No distress    Recommend client ambulate to toilet, walk 3-4 x daily.    Loma Messing, MOTR/L

## 2017-06-19 NOTE — Plan of Care (Signed)
Problem: Moderate/High Fall Risk Score >5  Goal: Patient will remain free of falls  Outcome: Progressing

## 2017-06-19 NOTE — PT Eval Note (Signed)
[facility]: [facility]  Department of Rehabilitation Services: [phone]  [name]    CSN: [id number]    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 At baseline patient ambulates independently with no assistive device and uses frontwheel walker when outside of the house, and is independent with ADLs. Today patient demonstrated independent bed mobility, independent transfers with no assistive device and ambulated 150 feet with front wheeled walker with modified independence:      Due to the presence of no treatment options and 1-2 comorbidities or personal factors that affect performance, as well as patient's stable and/or uncomplicated characteristics, modifications were NOT necessary to complete evaluation when examining 1-2 elements (includes body structures and functions, activity limitations and/or participation restrictions) determines the degree of complexity for this patient is LOW    Rehabilitation Potential:Not a PT candidate    Discussed risk, benefits and Plan of Care with: Patient    DISCHARGE RECOMMENDATIONS   DME recommended for Discharge:   Has needed equipment    Discharge Recommendations:   Home with supervision         PMP (Progressive Mobility Program) Recommendations:   Recommend patient  ambulate 2-3 times/day with Wheeled walker and physical assist and/or supervision of 1 staff as tolerated outside of PT sessions.     Development of Plan of Care:         Treatment/interventions: No skilled acute care interventions needed at this time., will d/c PT services at this time        Treatment Frequency: one time visit    History:   History of Present Illness:    Medical Diagnosis: Febrile illness, acute [R50.9]    ANNELI [name] is a 84 y.o. female admitted on 06/17/2017 with Above    Patient Active Problem List   Diagnosis   . Hematochezia   . PAF (paroxysmal atrial fibrillation)   . HTN (hypertension)   . Hypokalemia   . Hypothyroidism   . Chronic systolic (congestive) heart failure   . Cerebrovascular accident (CVA) with intracranial hemorrhage   . Acute CVA (cerebrovascular accident)   . Pleural effusion due to congestive heart failure   . Pleural effusion   . Sepsis        X-Rays/Tests/Labs:  Reviewed    Past Medical/Surgical History:  Past Medical History:   Diagnosis Date   . A-fib    . Aphasia    . Arrhythmia     afib   . Bradycardia    . Cerebral infarction    . Chest pain    . Congestive heart failure    .  Diarrhea    . Disorder of thyroid     hypo   . Hyperlipidemia    . Hypertension    . Melanoma    . Rheumatoid arthritis    . Vitamin D deficiency       Past Surgical History:   Procedure Laterality Date   . arm surgery      roght   . HIP SURGERY      left         Social History:    Home Living Arrangements:  Living Arrangements and available assistance:  Patient lives with her daughter who works part-time so can offer part-time assistance, patient has neighbors who can help out as needed  Type of Home and Home Layout:  Single level home, no steps to enter    Prior Level of Function:  Community ambulation  Mobility:  Independent with  No assistive device  Uses frontwheel walker when walking outside, is independent with ADLs, does not drive  Fall history: Denies falls in the past year    DME available at home:  Front wheeled walker    Subjective   Patient says she is able to do everything for herself   Patient is agreeable to participation in the therapy  session. Nursing clears patient for therapy.    Pain:  At Rest: 0/10  With Activity: 0/10  Location: N/A  Interventions: None required    Examination of Body Systems (Structures, Function, Activity and Participation)   Patient's medical condition is appropriate for Physical therapy intervention at this time    Observation of patient:  Patient presents Sitting in chair on room air     Cognition:  Oriented to: Oriented x4  Command following: Follows ALL commands and directions without difficulty, patient hard of hearing  Alertness/Arousal: Appropriate responses to stimuli   Attention Span:Appears intact  Safety Awareness: Independent    Vital Signs (Cardiovascular):  BP Sitting: 98/56 mmHg  HR Sitting:  88 bpm  SpO2 at rest: 88 %  SpO2 with activity: 93 % , patient needed her hands warmed to get an accurate reading  on room air      Balance:  [employer] Balance Scale  5/5    Patient moves and returns trunkal midpoint in all planes greater than 2 inches.  e.g. able to grasp and move object, react to unanticipated challenges, such as external force, catching a ball or hitting a balloon.  [employer] Standing Balance Scale  5/5    Patient independently moves and returns center of gravity in all planes greater than 2 inches.  e.g. able to grasp and move object, throw ball.              Musculoskeletal Examination:       Range of motion:  Decreased range of motion bilateral shoulders, right shoulder flexion AROM less than 20, left shoulder 90  BLE: Within functional limits       Strength:  BLE: Within functional limits, at least 4/5 in hips, ankles, knees with slightly less strength in right lower extremity      Functional Mobility:    Bed Mobility:  Supine to sit: Independent  Sit to supine: Independent    Transfers:  Sit-to-stand: Independent from arm chair and from bed  stand to sit: Independent    Locomotion:  Patient ambulated with front wheel walker 150 feet with modified independence. Patient  had slight decreased right foot clearance compared to left, walks with minimally decreased gait speed.  AM-PACT "6 Clicks" Basic Mobility Inpatient Short Form  Turning Over in Bed: None  Sitting Down On/Standing From Armchair: None  Lying on Back to Sitting on Side of Bed: None  Assist Moving to/from Bed to Chair: None  Assist to Walk in Hospital Room: None  Assist to Climb 3-5 Steps with Railing: None  PT Basic Mobility Raw Score: 24  CMS 0-100% Score: 0.00%                 Participation and Activity Tolerance:  Participation effort: Excellent  Activity Tolerance: Tolerates 10-20 minutes of activity without rest breaks    Treatment Interventions this session:   Patient/family/caregiver education    Education Provided:   TOPICS: role of physical therapy, plan of care, goals of therapy and benefits of activity, activity with nursing     Learner educated: Patient  Method: Explanation  Response to education: Verbalized understanding    Patient Position at End of Treatment:   On stretcher ready for transport for testing, patient in no distress, staff present    Team Communication:     Spoke to: RN/LPN - Tonya  Regarding: Pre-session re: patient status, Patient participation with Therapy  Whiteboard updated: N/A  PT/PTA communication: via written note and verbal communication as needed.    Time of treatment:  Time Calculation  PT Received On: 06/19/17  Start Time: 1119  Stop Time: 1144  Time Calculation (min): 25 min    Barrington Ellison Marko Skalski, PT    This note was completed using dragon medical speech recognition software. Grammatical errors, random word insertions, pronoun errors, incorrect word insertion, misspellings  and incomplete sentences are occasional consequences of this technology due to software limitations. If there are questions or concerns about the content of this note or information contained within the body of this dictation they should be addressed with the provider for clarification.

## 2017-06-20 ENCOUNTER — Inpatient Hospital Stay: Payer: Medicare Other

## 2017-06-20 LAB — CBC AND DIFFERENTIAL
Basophils %: 0.7 % (ref 0.0–3.0)
Basophils Absolute: 0.1 10*3/uL (ref 0.0–0.3)
Eosinophils %: 1.3 % (ref 0.0–7.0)
Eosinophils Absolute: 0.1 10*3/uL (ref 0.0–0.8)
Hematocrit: 42.9 % (ref 36.0–48.0)
Hemoglobin: 13.6 gm/dL (ref 12.0–16.0)
Lymphocytes Absolute: 2 10*3/uL (ref 0.6–5.1)
Lymphocytes: 23.2 % (ref 15.0–46.0)
MCH: 33 pg (ref 28–35)
MCHC: 32 gm/dL (ref 32–36)
MCV: 104 fL — ABNORMAL HIGH (ref 80–100)
MPV: 8.8 fL (ref 6.0–10.0)
Monocytes Absolute: 0.3 10*3/uL (ref 0.1–1.7)
Monocytes: 3.1 % (ref 3.0–15.0)
Neutrophils %: 71.7 % (ref 42.0–78.0)
Neutrophils Absolute: 6.2 10*3/uL (ref 1.7–8.6)
PLT CT: 238 10*3/uL (ref 130–440)
RBC: 4.12 10*6/uL (ref 3.80–5.00)
RDW: 15.6 % — ABNORMAL HIGH (ref 11.0–14.0)
WBC: 8.6 10*3/uL (ref 4.0–11.0)

## 2017-06-20 LAB — BASIC METABOLIC PANEL
Anion Gap: 13.5 mMol/L (ref 7.0–18.0)
BUN / Creatinine Ratio: 27.4 Ratio (ref 10.0–30.0)
BUN: 20 mg/dL (ref 7–22)
CO2: 24.4 mMol/L (ref 20.0–30.0)
Calcium: 8.7 mg/dL (ref 8.5–10.5)
Chloride: 101 mMol/L (ref 98–110)
Creatinine: 0.73 mg/dL (ref 0.60–1.20)
EGFR: 75 mL/min/{1.73_m2} (ref 60–150)
Glucose: 74 mg/dL (ref 71–99)
Osmolality Calc: 271 mOsm/kg — ABNORMAL LOW (ref 275–300)
Potassium: 3.9 mMol/L (ref 3.5–5.3)
Sodium: 135 mMol/L — ABNORMAL LOW (ref 136–147)

## 2017-06-20 LAB — PHOSPHORUS: Phosphorus: 2.8 mg/dL (ref 2.3–4.7)

## 2017-06-20 LAB — MAGNESIUM: Magnesium: 1.6 mg/dL (ref 1.6–2.6)

## 2017-06-20 MED ORDER — LOPERAMIDE HCL 2 MG PO CAPS
4.00 mg | ORAL_CAPSULE | Freq: Once | ORAL | Status: AC
Start: 2017-06-20 — End: 2017-06-20
  Administered 2017-06-20: 11:00:00 4 mg via ORAL
  Filled 2017-06-20: qty 2

## 2017-06-20 MED ORDER — DILTIAZEM HCL 5 MG/ML IV SOLN (WRAP)
10.00 mg | Freq: Once | INTRAVENOUS | Status: AC
Start: 2017-06-20 — End: 2017-06-20
  Administered 2017-06-20: 15:00:00 10 mg via INTRAVENOUS
  Filled 2017-06-20: qty 2

## 2017-06-20 MED ORDER — LOPERAMIDE HCL 2 MG PO CAPS
2.00 mg | ORAL_CAPSULE | Freq: Four times a day (QID) | ORAL | Status: DC | PRN
Start: 2017-06-20 — End: 2017-06-23
  Administered 2017-06-22 – 2017-06-23 (×2): 2 mg via ORAL
  Filled 2017-06-20: qty 4
  Filled 2017-06-20: qty 1

## 2017-06-20 NOTE — Plan of Care (Addendum)
NURSE NOTE SUMMARY  [facility] MEDICAL OVERFLOW   Patient Name: Kristina [name]   Attending Physician: Donnie Coffin, DO   Today's date:   06/20/2017 LOS: 3 days   Shift Summary:                                                              7:34 PM Report received. Assumed care of patient. Assessment completed. Pt A&Ox2-3. Forgetful at times. Tachypnea but clear on RA. Denies pain or SOB at this time. On RA. Tele intact. Call bell in reach. Will cont to monitor throughout shift.        Provider Notifications:      Rapid Response Notifications:  Mobility:      PMP Activity: Step 6 - Walks in Room (06/20/2017  8:43 PM)   Weight tracking:  Family Dynamic:   No data found.       Healthcare Agent's Name: Dajia Gunnels  Healthcare Agent's Phone Number: [phone]   Recent Vitals Last Bowel Movement   BP 110/73   Pulse (!) 119   Temp 98.1 F (36.7 C) (Oral)   Resp 20   Ht 1.6 m (5\' 3" )   Wt 50.3 kg (110 lb 14.3 oz)   SpO2 92%   BMI 19.64 kg/m  Last BM Date: 06/20/17            Problem: Compromised Tissue integrity  Goal: Damaged tissue is healing and protected  Outcome: Progressing   06/20/17 2044   Goal/Interventions addressed this shift   Damaged tissue is healing and protected  Monitor/assess Braden scale every shift;Reposition patient every 2 hours and as needed unless able to reposition self;Increase activity as tolerated/progressive mobility;Relieve pressure to bony prominences for patients at moderate and high risk;Keep intact skin clean and dry;Avoid shearing injuries;Use incontinence wipes for cleaning urine, stool and caustic drainage. Foley care as needed;Monitor patient's hygiene practices       Problem: Moderate/High Fall Risk Score >5  Goal: Patient will remain free of falls  Outcome: Progressing      Problem: Inadequate Tissue Perfusion  Goal: Adequate tissue perfusion will be maintained  Outcome: Progressing   06/20/17 2044   Goal/Interventions addressed this shift   Adequate  tissue perfusion will be maintained Monitor/assess vital signs;Monitor/assess lab values and report abnormal values;Monitor/assess neurovascular status (pulses, capillary refill, pain, paresthesia, paralysis, presence of edema);Monitor intake and output;Monitor/assess for signs of VTE (edema of calf/thigh redness, pain);Monitor for signs and symptoms of a pulmonary embolism (dyspnea, tachypnea, tachycardia, confusion);VTE Prevention: Administer anticoagulant(s) and/or apply anti-embolism stockings/devices as ordered;Encourage/assist patient as needed to turn, cough, and perform deep breathing every 2 hours;Perform active/passive ROM;Reinforce ankle pump exercises;Increase mobility as tolerated/progressive mobility;Elevate feet;Place shoes or other foot protection on patient;Assess and monitor skin integrity

## 2017-06-20 NOTE — Plan of Care (Signed)
Problem: Compromised Tissue integrity  Goal: Damaged tissue is healing and protected  Outcome: Progressing   06/20/17 0413   Goal/Interventions addressed this shift   Damaged tissue is healing and protected  Monitor/assess Braden scale every shift;Increase activity as tolerated/progressive mobility;Relieve pressure to bony prominences for patients at moderate and high risk;Keep intact skin clean and dry     Goal: Nutritional status is improving  Outcome: Completed Date Met: 06/20/17   06/20/17 0413   Goal/Interventions addressed this shift   Nutritional status is improving Assist patient with eating;Allow adequate time for meals       Problem: Moderate/High Fall Risk Score >5  Goal: Patient will remain free of falls  Outcome: Progressing   06/19/17 2230   High Risk Falls Interventions (Greater than 13)   VH High Risk (Greater than 13) Use assistive devices;PATIENT IS TO BE SUPERVISED FOR ALL TOILETING ACTIVITIES;BED ALARM WILL BE ACTIVATED WHEN THE PATEINT IS IN BED WITH SIGNAGE "RESET BED ALARM";RED "HIGH FALL RISK" SIGNAGE;ALL REQUIRED MODERATE INTERVENTIONS;ALL REQUIRED LOW INTERVENTIONS;Request PT/OT therapy consult order from physician for patients with gait/mobility impairment;Include family/significant other in multidisciplinary discussion regarding plan of care as appropriate       Problem: Inadequate Tissue Perfusion  Goal: Adequate tissue perfusion will be maintained  Outcome: Progressing   06/20/17 0413   Goal/Interventions addressed this shift   Adequate tissue perfusion will be maintained Monitor/assess vital signs;Monitor/assess lab values and report abnormal values;Monitor/assess neurovascular status (pulses, capillary refill, pain, paresthesia, paralysis, presence of edema);Monitor intake and output;Monitor for signs and symptoms of a pulmonary embolism (dyspnea, tachypnea, tachycardia, confusion)       Problem: Impaired Mobility  Goal: Mobility/Activity is maintained at optimal level for  patient  Outcome: Progressing   06/20/17 0413   Goal/Interventions addressed this shift   Mobility/activity is maintained at optimal level for patient Increase mobility as tolerated/progressive mobility;Encourage independent activity per ability;Maintain proper body alignment;Plan activities to conserve energy, plan rest periods

## 2017-06-20 NOTE — Progress Notes (Signed)
06/20/17 1526   Case Management Quick Doc   Case Management Assessment Status Assessment Complete   CM Comments 06/20/17 RNCM: Admitted w/ early sepsis, GCS bacteremia, pleural effusion w/ pleurx in place PTA. IV rocephin - transition to PO abx tomorrow per notes. Started on Eliquis - from note, it read like this was a new med to pt, however pt states she was on Eliquis PTA. HH to resume at [place]. Anticipating [place] home w/ family support once medically ready. CM following.   Elsie Amis RN, BSN  Nurse Case Manager  [facility]  [phone]

## 2017-06-20 NOTE — Progress Notes (Signed)
Slept well overnight.  Up to br with standby assist and walker- voiding without diff.  resp's even & unlabored.  pleurovac capped.  No c/o or needs voiced.

## 2017-06-20 NOTE — Plan of Care (Signed)
Problem: Inadequate Tissue Perfusion  Goal: Adequate tissue perfusion will be maintained  Outcome: Progressing

## 2017-06-20 NOTE — Progress Notes (Signed)
[facility]  [address]  [place], [place] [address]      INPATIENT SERVICE PROGRESS NOTE    Date Time: 06/20/17 8:45 AM  Patient Name: Kristina [name],Kristina [name]  DOB:@, 84 y.o.  MRN:@  Attending Physician: Donnie Coffin, DO  Length of stay: 3    Assessment & Plan:   Active Problems:    Pleural effusion    Sepsis  Resolved Problems:    * No resolved hospital problems. *    Bacteremia  Blood UE:AVWUJ C strep  Pleural Fluid studies consistent with Group C strep  Rocephin 2g 4 days ov IV abx, (fever on 4/2), currently fifth day of antibiotics.   We'll transition to oral tomorrow  Lomotil provided    Pleural effusion, Causes likely associated with cardiomyopathy of left heart and right heart failure  Hx ofParapneumonic effusion, Considerinfectious cause due to fever and fluid in [name] lung  Likely cause can also include cardiomyopathy  EF 30-35%. There is global hypokinesis.   ECHO showing both LV failure EF 40% and RV failure withtricuspid regurgitation (12/13)  Pleural catheter placed on 06/05/2017    PAF (paroxysmal atrial fibrillation) with RVR  HR in 120s  Cardizem po, lowered SBP parameters  Started on low dose Eliquis, consulted with neurology who are in agreement with plan    Hx of Cerebral infarction with hemorrhagic conversion   Monitor Clinically     Right/Left Congestive Heart Failure  Lasix 40mg     Hypokalemia Resolved  Replete as needed    HTN (hypertension)  No anti-hypertensive at this time    HLD  Not on statin since previous admission    Hypothyroidism  continue levothyroxin qd    Rheumatoid Arthritis  -Methotrexate    Depression  Psychiatry rec. Remeron,due to sleepiness dose reduced to 7.5mg  daily    DVT Prophylaxis:  Diet: As tolerated    Code Status: DNR  Service status: INPATIENT   Anticipate discharge in:2 days.    Subjective     CC: Diarrhea    HPI/Subjective: Pt stated she has been having diarrhea this morning "running through her."  Talked about the effects of abx.   Will monitor accordingly.  Otherwise no sxs or concerns at this time.     Review of Systems:     Review of systems as noted in HPI. Full 12 point ROS otherwise negative.    Physical Exam:     Vitals:    06/20/17 0258   BP:    Pulse:    Resp:    Temp: 97.3 F (36.3 C)   SpO2:      Body mass index is 19.64 kg/m.     Intake/Output Summary (Last 24 hours) at 06/20/17 0845  Last data filed at 06/19/17 1400   Gross per 24 hour   Intake              240 ml   Output              450 ml   Net             -210 ml       Physical Exam  Constitutional: She is oriented to person, place, and time. She appears well-developedand well-nourished. No distress.   HENT:   Head: Normocephalicand atraumatic.   Eyes: Pupils are equal, round, and reactive to light. Conjunctivaeare normal.   Neck: Normal range of motion. Neck supple. No JVDpresent.   Cardiovascular: Intact distal pulses. An irregularly irregular  rhythmpresent. Labile Bps.  Pulmonary/Chest: Effort normal. Improved lung sounds overall   There is a right lateral pleural catheter in place without erythema, no drainage present since IR drainage.  Abdominal: Soft. There is no tenderness.   Musculoskeletal: She exhibits no edema.   Neurological: She is alertand oriented to person, place, and time.   Skin: Capillary refill takes less than 2 seconds.   Psychiatric: She has a normal mood and affect. Her behavior is normal.      Meds:     Current Facility-Administered Medications   Medication Dose Route Frequency   . apixaban  2.5 mg Oral Q12H SCH   . brimonidine-timolol  1 drop Right Eye BID   . cefTRIAXone  2 g Intravenous Q24H   . dilTIAZem  240 mg Oral QAM   . docusate sodium  100 mg Oral Daily   . folic acid  1 mg Oral Daily   . furosemide  40 mg Oral Daily   . lactobacillus species  50 Billion CFU Oral Daily   . latanoprost  1 drop Right Eye QHS   . levothyroxine  88 mcg Oral Daily at 0600   . magnesium oxide  400 mg Oral Daily   . [START ON 06/24/2017] methotrexate  10  mg Oral Weekly   . mirtazapine  7.5 mg Oral QHS   . predniSONE  5 mg Oral Daily   . senna-docusate  2 tablet Oral QHS   . sodium chloride (PF)  3 mL Intravenous Q8H     acetaminophen **OR** acetaminophen **OR** acetaminophen, albuterol, fluticasone, naloxone, nitroglycerin      Labs:     RECENT LABS (from the past 7 days)    Recent Labs  Lab 06/19/17  1727 06/19/17  0808   WBC 11.6* 10.8   RBC 4.23 4.05   Hemoglobin 13.9 13.1   Hematocrit 44.2 42.8   MCV 104* 106*   PLT CT 237 226       Recent Labs  Lab 06/17/17  1330   aPTT 25.5          Recent Labs  Lab 06/19/17  1727 06/19/17  0808 06/18/17  0835   Glucose 95 84 103*   Sodium 136 136 138   Potassium 3.8 3.9 3.5   Chloride 99 100 101   CO2 27.1 28.2 28.6   BUN 22 24* 23*   Creatinine 0.83 0.94 0.89   EGFR 65 55* 59*   Calcium 9.1 8.9 8.7       Recent Labs  Lab 06/19/17  0808 06/18/17  0835 06/17/17  1330   Magnesium 1.9 1.6  --    Phosphorus 3.6 3.6  --    Albumin  --   --  2.7*   Protein, Total  --   --  5.9*   Bilirubin, Total  --   --  0.7   Alkaline Phosphatase  --   --  81   ALT  --   --  16   AST (SGOT)  --   --  40      Recent Labs  Lab 06/17/17  1430   Specific Gravity, UR 1.023   pH, Urine 5.0   Protein, UR 30*   Glucose, UA Negative   Ketones UA Negative   Bilirubin, UA Negative   Blood, UA Small*   Nitrite, UA Negative   Urobilinogen, UA Normal   Leukocyte Esterase, UA Negative   WBC, UA 2   RBC, UA 1  Lab Results   Component Value Date    HGBA1CPERCNT 5.7 02/24/2017              Radiology:     Xr Chest Ap Portable    Result Date: 06/17/2017  The right chest tube has been adjusted, with a mild decrease in the size of the large right pleural effusion. There is no pneumothorax. ReadingStation:[id number]      Signed by:     Isac Caddy, MD  Family Medicine Resident, PGY-1

## 2017-06-21 LAB — BASIC METABOLIC PANEL
Anion Gap: 14.2 mMol/L (ref 7.0–18.0)
BUN / Creatinine Ratio: 28 Ratio (ref 10.0–30.0)
BUN: 21 mg/dL (ref 7–22)
CO2: 21.8 mMol/L (ref 20.0–30.0)
Calcium: 8.9 mg/dL (ref 8.5–10.5)
Chloride: 101 mMol/L (ref 98–110)
Creatinine: 0.75 mg/dL (ref 0.60–1.20)
EGFR: 73 mL/min/{1.73_m2} (ref 60–150)
Glucose: 77 mg/dL (ref 71–99)
Osmolality Calc: 268 mOsm/kg — ABNORMAL LOW (ref 275–300)
Potassium: 4 mMol/L (ref 3.5–5.3)
Sodium: 133 mMol/L — ABNORMAL LOW (ref 136–147)

## 2017-06-21 LAB — CBC AND DIFFERENTIAL
Basophils %: 0 % (ref 0.0–3.0)
Basophils Absolute: 0 10*3/uL (ref 0.0–0.3)
Eosinophils %: 2 % (ref 0.0–7.0)
Eosinophils Absolute: 0.2 10*3/uL (ref 0.0–0.8)
Hematocrit: 43 % (ref 36.0–48.0)
Hemoglobin: 13.9 gm/dL (ref 12.0–16.0)
Lymphocytes Absolute: 1.8 10*3/uL (ref 0.6–5.1)
Lymphocytes: 20 % (ref 15.0–46.0)
MCH: 34 pg (ref 28–35)
MCHC: 32 gm/dL (ref 32–36)
MCV: 104 fL — ABNORMAL HIGH (ref 80–100)
MPV: 8.5 fL (ref 6.0–10.0)
Metamyelocytes: 2 % — ABNORMAL HIGH (ref 0–0)
Monocytes Absolute: 0.9 10*3/uL (ref 0.1–1.7)
Monocytes: 10 % (ref 3.0–15.0)
Neutrophils %: 66 % (ref 42.0–78.0)
Neutrophils Absolute: 6.1 10*3/uL (ref 1.7–8.6)
PLT CT: 276 10*3/uL (ref 130–440)
RBC: 4.12 10*6/uL (ref 3.80–5.00)
RDW: 16 % — ABNORMAL HIGH (ref 11.0–14.0)
WBC: 9 10*3/uL (ref 4.0–11.0)

## 2017-06-21 MED ORDER — DILTIAZEM HCL ER COATED BEADS 300 MG PO CP24
300.00 mg | ORAL_CAPSULE | Freq: Every morning | ORAL | Status: DC
Start: 2017-06-21 — End: 2017-06-23
  Administered 2017-06-21 – 2017-06-23 (×3): 300 mg via ORAL
  Filled 2017-06-21 (×3): qty 1

## 2017-06-21 MED ORDER — CLOTRIMAZOLE 10 MG MT LOZG
10.00 mg | LOZENGE | Freq: Three times a day (TID) | OROMUCOSAL | Status: DC
Start: 2017-06-21 — End: 2017-06-23
  Administered 2017-06-21 – 2017-06-23 (×7): 10 mg via ORAL
  Filled 2017-06-21 (×9): qty 1

## 2017-06-21 NOTE — Progress Notes (Signed)
[facility]  [address]  [place], [place] [address]      INPATIENT SERVICE PROGRESS NOTE    Date Time: 06/21/17 8:13 AM  Patient Name: Kristina [name],Kristina [name]  DOB:@, 84 y.o.  MRN:@  Attending Physician: Donnie Coffin, DO  Length of stay: 4    Assessment & Plan:   Active Problems:    Pleural effusion    Sepsis  Resolved Problems:    * No resolved hospital problems. *    Bacteremia  Blood GN:FAOZH C strep  Pleural Fluid studies consistent with Group C strep  Rocephin 2g 4 days ov IV abx, (fever on 4/2), 5 days of IV abx and transition to oral  We'll transition to oral 06/22/17  Lomotil provided    Pleural effusion, Causes likely associated with cardiomyopathy of left heart and right heart failure  Hx ofParapneumonic effusion, Considerinfectious cause due to fever and fluid in [name] lung  EF 30-35%. There is global hypokinesis.   ECHO showing both LV failure EF 40% and RV failure withtricuspid regurgitation (12/13)  Pleural catheter placed on 06/05/2017, drained for 1L    PAF (paroxysmal atrial fibrillation) with RVR  HR in 120s  Cardizem po 300mg , lowered SBP parameters  Started on low dose Eliquis, consulted with neurology who are in agreement with plan    Hx of Cerebral infarction with hemorrhagic conversion   Monitor Clinically     Right/Left Congestive Heart Failure  Lasix 40mg     HypokalemiaResolved  Replete as needed    HTN (hypertension)  No anti-hypertensive at this time    HLD  Not on statin since previous admission    Hypothyroidism  continue levothyroxin qd    Rheumatoid Arthritis  -Methotrexate    Depression  Psychiatry rec. Remeron,due to sleepiness dose reduced to 7.5mg  daily    DVT Prophylaxis:  Diet: As tolerated    Code Status: DNR  Service status: INPATIENT   Anticipate discharge in:2 days.    Subjective     CC: I feel good    HPI/Subjective: Pt is very pleasant woman who feels as if she is ready to go home.  BM have improved in consistency.  HR has improved with  Cardizem.  Pt is doing well overall with no complaints at this time.    Review of Systems:     Review of systems as noted in HPI. Full 12 point ROS otherwise negative.    Physical Exam:     Vitals:    06/21/17 0753   BP: 118/77   Pulse: (!) 109   Resp: 20   Temp: 98.1 F (36.7 C)   SpO2: 92%     Body mass index is 19.64 kg/m.     Intake/Output Summary (Last 24 hours) at 06/21/17 0813  Last data filed at 06/21/17 0609   Gross per 24 hour   Intake              340 ml   Output                0 ml   Net              340 ml       Physical Exam  Constitutional: She is oriented to person, place, and time. She appears well-developedand well-nourished. No distress.   HENT:   Head: Normocephalicand atraumatic.   Eyes: Pupils are equal, round, and reactive to light. Conjunctivaeare normal.   Neck: Normal range of motion. Neck supple.  No JVDpresent.   Cardiovascular: Intact distal pulses. An irregularly irregular rhythmpresent. Labile Bps.  Pulmonary/Chest: Effort normal. Improved lung sounds overall   There is a right lateral pleural catheter in place without erythema.  Abdominal: Soft. There is no tenderness.   Musculoskeletal: She exhibits no edema.   Neurological: She is alertand oriented to person, place, and time.   Skin: Capillary refill takes less than 2 seconds.   Psychiatric: She has a normal mood and affect. Her behavior is normal.    Meds:     Current Facility-Administered Medications   Medication Dose Route Frequency   . apixaban  2.5 mg Oral Q12H SCH   . brimonidine-timolol  1 drop Right Eye BID   . cefTRIAXone  2 g Intravenous Q24H   . dilTIAZem  300 mg Oral QAM   . docusate sodium  100 mg Oral Daily   . folic acid  1 mg Oral Daily   . furosemide  40 mg Oral Daily   . lactobacillus species  50 Billion CFU Oral Daily   . latanoprost  1 drop Right Eye QHS   . levothyroxine  88 mcg Oral Daily at 0600   . magnesium oxide  400 mg Oral Daily   . [START ON 06/24/2017] methotrexate  10 mg Oral Weekly   .  mirtazapine  7.5 mg Oral QHS   . predniSONE  5 mg Oral Daily   . senna-docusate  2 tablet Oral QHS   . sodium chloride (PF)  3 mL Intravenous Q8H     acetaminophen **OR** acetaminophen **OR** acetaminophen, albuterol, fluticasone, loperamide, naloxone, nitroglycerin      Labs:     RECENT LABS (from the past 7 days)    Recent Labs  Lab 06/21/17  0620 06/20/17  1110   WBC 9.0 8.6   RBC 4.12 4.12   Hemoglobin 13.9 13.6   Hematocrit 43.0 42.9   MCV 104* 104*   PLT CT 276 238       Recent Labs  Lab 06/17/17  1330   aPTT 25.5          Recent Labs  Lab 06/21/17  0620 06/20/17  1110 06/19/17  1727   Glucose 77 74 95   Sodium 133* 135* 136   Potassium 4.0 3.9 3.8   Chloride 101 101 99   CO2 21.8 24.4 27.1   BUN 21 20 22    Creatinine 0.75 0.73 0.83   EGFR 73 75 65   Calcium 8.9 8.7 9.1       Recent Labs  Lab 06/20/17  1110 06/19/17  0808  06/17/17  1330   Magnesium 1.6 1.9 More results in Results Review  --    Phosphorus 2.8 3.6 More results in Results Review  --    Albumin  --   --   --  2.7*   Protein, Total  --   --   --  5.9*   Bilirubin, Total  --   --   --  0.7   Alkaline Phosphatase  --   --   --  81   ALT  --   --   --  16   AST (SGOT)  --   --   --  40   More results in Results Review = values in this interval not displayed.   Recent Labs  Lab 06/17/17  1430   Specific Gravity, UR 1.023   pH, Urine 5.0   Protein, UR 30*   Glucose,  UA Negative   Ketones UA Negative   Bilirubin, UA Negative   Blood, UA Small*   Nitrite, UA Negative   Urobilinogen, UA Normal   Leukocyte Esterase, UA Negative   WBC, UA 2   RBC, UA 1      Lab Results   Component Value Date    HGBA1CPERCNT 5.7 02/24/2017              Radiology:     Xr Chest 2 Views    Result Date: 06/20/2017  Cardiomegaly with bilateral pleural effusions right greater than left. Interval improvement in right effusion as compared to 06/17/2017. Right chest tube in place with no observed pneumothorax. ReadingStation:[email or web address]    Xr Chest Ap Portable    Result Date: 06/17/2017  The  right chest tube has been adjusted, with a mild decrease in the size of the large right pleural effusion. There is no pneumothorax. ReadingStation:[email or web address]          Signed by:     Isac Caddy, MD  Family Medicine Resident, PGY-1

## 2017-06-21 NOTE — Plan of Care (Signed)
Problem: Inadequate Tissue Perfusion  Goal: Adequate tissue perfusion will be maintained  Outcome: Progressing

## 2017-06-21 NOTE — Consults (Signed)
INFECTIOUS DISEASE CONSULT NOTE    Date Time: 06/21/17 1:53 PM  Patient Name: Kristina [name]  Attending Physician: Donnie Coffin, DO    Reason for Consult: Group C strep bacteremia    Subjective     CC: <principal problem not specified>  Active Problems:    Pleural effusion    Sepsis      HPI/Subjective:   84 year old female who presented with a fever of 38.7 white count was 9.2 she was placed on empiric Zosyn insufflated vancomycin. She has grown group C streptococcus from her urine for blood and from her right sided pleural fluid initially was hypotensive had lactic acidosis with the tachycardia. She's had recurrent right-sided pleural effusions with negative cultures. This is been felt secondary to chronic right ventricular failure. The catheter was placed on 06/05/17. It's drained twice a week by a suction drain through home health. She has anAccal Evacuated Drainage system.. Within 24 hours of admission she was afebrile and has been afebrile for 3 days. She's currently on day 5 of antibiotics now on ceftriaxone    Review of Systems:   She has a poor memory. But she is alert and interactive. She did not have any sick cough or increased chest pain. She had no nausea vomiting or diarrhea. She didn't have urinary frequency or flank pain. 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 She is not complaining of difficulty breathing. She denies significant ankle swelling. She has rheumatoid arthritis. She is not having increase in her joint pain. She does take methotrexate. She does not have a history of recurrent infections. She plans to leave the hospital stay with her daughter.    Past Medical History:     Past Medical History:   Diagnosis Date   . A-fib    . Aphasia    . Arrhythmia     afib   . Bradycardia    . Cerebral infarction    . Chest pain    . Congestive heart failure    . Diarrhea    . Disorder of thyroid      hypo   . Hyperlipidemia    . Hypertension    . Melanoma    . Rheumatoid arthritis    . Vitamin D deficiency        Past Surgical History:     Past Surgical History:   Procedure Laterality Date   . arm surgery      roght   . HIP SURGERY      left         Allergies:     Allergies   Allergen Reactions   . Lisinopril Anaphylaxis   . Gluten Meal        Social History:     Social History     Social History   . Marital status: Widowed     Spouse name: N/A   . Number of children: N/A   . Years of education: N/A     Occupational History   . Not on file.     Social History Main Topics   . Smoking status: Never Smoker   . Smokeless tobacco: Never Used   . Alcohol use Yes      Comment: rarely glass of wine   . Drug use: No   . Sexual activity: Not on file     Other Topics Concern   . Not on file  Social History Narrative   . No narrative on file           Family History:   History reviewed. No pertinent family history.    Physical Exam:   Temp:  [97.2 F (36.2 C)-98.1 F (36.7 C)] 98.1 F (36.7 C)  Heart Rate:  [78-120] 83  Resp Rate:  [16-20] 16  BP: (91-118)/(60-78) 97/60    Wt Readings from Last 3 Encounters:   06/17/17 50.3 kg (110 lb 14.3 oz)   06/05/17 51.7 kg (114 lb)   03/19/17 57.2 kg (126 lb)       Intake/Output Summary (Last 24 hours) at 06/21/17 1353  Last data filed at 06/21/17 1100   Gross per 24 hour   Intake              600 ml   Output                0 ml   Net              600 ml       He is alert and oriented but very forgetful. Her speech is clear. Conjunctiva clear. She has whitish coating to her tongue. No oral ulcers. No herpetic lesions on her lips. Neck is supple without adenopathy. No JVD. Lungs diminished breath sounds in the right sided pleuritic chest pleural catheter in place. There is straw-colored fluid which is been obtained through the vacuum system. Left side is clear. Heart rhythm is irregular without a distinct murmur. Abdomen is soft nontender no hepatosplenomegaly active bowel  sounds. No suprapubic or flank tenderness. Extremities no active synovitis no evidence of cyanosis or ankle edema    Meds:     Current Facility-Administered Medications   Medication Dose Route Frequency   . apixaban  2.5 mg Oral Q12H SCH   . brimonidine-timolol  1 drop Right Eye BID   . cefTRIAXone  2 g Intravenous Q24H   . clotrimazole  10 mg Oral TID   . dilTIAZem  300 mg Oral QAM   . docusate sodium  100 mg Oral Daily   . folic acid  1 mg Oral Daily   . furosemide  40 mg Oral Daily   . lactobacillus species  50 Billion CFU Oral Daily   . latanoprost  1 drop Right Eye QHS   . levothyroxine  88 mcg Oral Daily at 0600   . magnesium oxide  400 mg Oral Daily   . [START ON 06/24/2017] methotrexate  10 mg Oral Weekly   . mirtazapine  7.5 mg Oral QHS   . predniSONE  5 mg Oral Daily   . senna-docusate  2 tablet Oral QHS   . sodium chloride (PF)  3 mL Intravenous Q8H     acetaminophen **OR** acetaminophen **OR** acetaminophen, albuterol, fluticasone, loperamide, naloxone, nitroglycerin          Labs:     Labs (last 24 hours):  Results     Procedure Component Value Units Date/Time    CBC with differential [[id number]]  (Abnormal) Collected:  06/21/17 0620    Specimen:  Blood from Blood Updated:  06/21/17 0827     WBC 9.0 K/cmm      RBC 4.12 M/cmm      Hemoglobin 13.9 gm/dL      Hematocrit 40.9 %      MCV 104 (H) fL      MCH 34 pg      MCHC 32 gm/dL  RDW 16.0 (H) %      PLT CT 276 K/cmm      MPV 8.5 fL      NEUTROPHIL % 66.0 %      Lymphocytes 20.0 %      Monocytes 10.0 %      Eosinophils % 2.0 %      Basophils % 0.0 %      Metamyelocytes 2 (H) %      Neutrophils Absolute 6.1 K/cmm      Lymphocytes Absolute 1.8 K/cmm      Monocytes Absolute 0.9 K/cmm      Eosinophils Absolute 0.2 K/cmm      BASO Absolute 0.0 K/cmm      RBC Morphology Morphology Consistent with Hemogram     Macrocytic 2+     Anisocytosis 1+     Polychromasia 1+     Poikilocytosis 1+     Target Cells 1+    Narrative:       Manual differential performed     Basic Metabolic Panel [[id number]]  (Abnormal) Collected:  06/21/17 0620    Specimen:  Plasma Updated:  06/21/17 0736     Sodium 133 (L) mMol/L      Potassium 4.0 mMol/L      Chloride 101 mMol/L      CO2 21.8 mMol/L      Calcium 8.9 mg/dL      Glucose 77 mg/dL      Creatinine 4.09 mg/dL      BUN 21 mg/dL      Anion Gap 81.1 mMol/L      BUN/Creatinine Ratio 28.0 Ratio      EGFR 73 mL/min/1.67m2      Osmolality Calc 268 (L) mOsm/kg     Urine Culture [[id number]] Collected:  06/17/17 1430    Specimen:  Urine from Clean Catch Updated:  06/20/17 1400    Narrative:       Specimen/Source: Urine Specimens/Clean Catch  Collected: 06/17/2017 14:30     Status: Final      Last Updated: 06/20/2017 14:00                ISO #1 (Final)      Less than 10,000 CFU/mL      Beta Hemolytic Streptococcus Group C      If Penicillin sensitive, Cefazolin sensitive.                               ISO #1                            Beta Hemolytic                            Streptococcus Group C                            ---------------------    MIC (mcg/ml)      Penicillin (P)        <=0.06           S      Vancomycin (V)        0.5              S          Pleural Fluid Culture and Smear [[id number]] Collected:  06/18/17 1200  Specimen:  Pleural from Pleural Fluid Updated:  06/20/17 1251    Narrative:       Specimen/Source: Pleural/Pleural Fluid  Collected: 06/18/2017 12:00     Status: Final      Last Updated: 06/20/2017 12:51                Gram Stain (Final)      Many WBC's Seen      No Organisms Seen      Specimen performed by cytocentrifugation methodology       ISO #1 (Final)      Moderate Growth      Beta Hemolytic Streptococcus Group C      If Penicillin sensitive, Cefazolin sensitive.                               ISO #1                            Beta Hemolytic                            Streptococcus Group C                            ---------------------    MIC (mcg/ml)      Clindamycin (CD)      >=1              R      Erythromycin  (E)                       I      Penicillin (P)        <=0.06           S      Vancomycin (V)        0.25             S          CBC with differential [[id number]]  (Abnormal) Collected:  06/20/17 1110    Specimen:  Blood from Blood Updated:  06/20/17 1212     WBC 8.6 K/cmm      RBC 4.12 M/cmm      Hemoglobin 13.6 gm/dL      Hematocrit 11.9 %      MCV 104 (H) fL      MCH 33 pg      MCHC 32 gm/dL      RDW 14.7 (H) %      PLT CT 238 K/cmm      MPV 8.8 fL      NEUTROPHIL % 71.7 %      Lymphocytes 23.2 %      Monocytes 3.1 %      Eosinophils % 1.3 %      Basophils % 0.7 %      Neutrophils Absolute 6.2 K/cmm      Lymphocytes Absolute 2.0 K/cmm      Monocytes Absolute 0.3 K/cmm      Eosinophils Absolute 0.1 K/cmm      BASO Absolute 0.1 K/cmm     Basic Metabolic Panel [[id number]]  (Abnormal) Collected:  06/20/17 1110    Specimen:  Plasma Updated:  06/20/17 1204     Sodium 135 (L) mMol/L      Potassium 3.9 mMol/L  Chloride 101 mMol/L      CO2 24.4 mMol/L      Calcium 8.7 mg/dL      Glucose 74 mg/dL      Creatinine 1.61 mg/dL      BUN 20 mg/dL      Anion Gap 09.6 mMol/L      BUN/Creatinine Ratio 27.4 Ratio      EGFR 75 mL/min/1.62m2      Osmolality Calc 271 (L) mOsm/kg     Phosphorus [[id number]] Collected:  06/20/17 1110    Specimen:  Plasma Updated:  06/20/17 1204     Phosphorus 2.8 mg/dL     Magnesium [[id number]] Collected:  06/20/17 1110    Specimen:  Plasma Updated:  06/20/17 1204     Magnesium 1.6 mg/dL     Basic Metabolic Panel [[id number]] Collected:  06/19/17 1727    Specimen:  Plasma Updated:  06/19/17 1856     Sodium 136 mMol/L      Potassium 3.8 mMol/L      Chloride 99 mMol/L      CO2 27.1 mMol/L      Calcium 9.1 mg/dL      Glucose 95 mg/dL      Creatinine 4.69 mg/dL      BUN 22 mg/dL      Anion Gap 62.9 mMol/L      BUN/Creatinine Ratio 26.5 Ratio      EGFR 65 mL/min/1.60m2      Osmolality Calc 275 mOsm/kg     CBC [[id number]]  (Abnormal) Collected:  06/19/17 1727    Specimen:  Blood from Blood Updated:  06/19/17  1830     WBC 11.6 (H) K/cmm      RBC 4.23 M/cmm      Hemoglobin 13.9 gm/dL      Hematocrit 01.0 %      MCV 104 (H) fL      MCH 33 pg      MCHC 31 (L) gm/dL      RDW 27.2 (H) %      PLT CT 237 K/cmm      MPV 9.0 fL     Blood Culture - Venipuncture # 1 [[id number]] Collected:  06/17/17 1331    Specimen:  Blood from Venipuncture Updated:  06/19/17 1403    Narrative:       Specimen/Source: Blood/Venipuncture  Collected: 06/17/2017 13:31     Status: Final      Last Updated: 06/19/2017 14:03                Gram Stain (Final)      1 of 2 Cultures Positive      Results called and read back by (licensed clinician/date/time/tech)      Delia Chimes RN 06/18/17 0200 80.        Gram Stain  Gram Positive Cocci                    In Chains       Blood Culture Gram Positive Panel (Final)      Verigene Blood Culture Panel      Streptococcus species Detected by NAT      Interpretive Comment:  Antimicrobial resistance may be present by      mechanisms not detected by Nucleic acid capture and probe detection      methodology (NAT).  Culture results to follow.      Results called and read back by (licensed clinician/date/time/tech):      Clara Boeing  06-18-17 [phone] #[phone]       ISO #1 (Final)      Beta Hemolytic Streptococcus Group C      If Penicillin sensitive, Cefazolin sensitive.                               ISO #1                            Beta Hemolytic                            Streptococcus Group C                            ---------------------    MIC (mcg/ml)      Clindamycin (CD)      >=1              R      Erythromycin (E)                       I      Penicillin (P)        <=0.06           S      Vancomycin (V)        0.25             S          Blood Culture - Venipuncture  # 2 [[id number]] Collected:  06/17/17 1849    Specimen:  Blood from Venipuncture Updated:  06/19/17 1335    Narrative:       Specimen/Source: Blood/Venipuncture  Collected: 06/17/2017 18:49     Status: Valued      Last Updated: 06/19/2017 13:34                 Culture Result (Prelim)      No Growth To Date          Basic Metabolic Panel [[id number]]  (Abnormal) Collected:  06/19/17 0808    Specimen:  Plasma Updated:  06/19/17 0926     Sodium 136 mMol/L      Potassium 3.9 mMol/L      Chloride 100 mMol/L      CO2 28.2 mMol/L      Calcium 8.9 mg/dL      Glucose 84 mg/dL      Creatinine 3.87 mg/dL      BUN 24 (H) mg/dL      Anion Gap 56.4 mMol/L      BUN/Creatinine Ratio 25.5 Ratio      EGFR 55 (L) mL/min/1.16m2      Osmolality Calc 275 mOsm/kg     Phosphorus [[id number]] Collected:  06/19/17 0808    Specimen:  Plasma Updated:  06/19/17 0926     Phosphorus 3.6 mg/dL     Magnesium [[id number]] Collected:  06/19/17 0808    Specimen:  Plasma Updated:  06/19/17 0926     Magnesium 1.9 mg/dL     CBC with differential [[id number]]  (Abnormal) Collected:  06/19/17 0808    Specimen:  Blood from Blood Updated:  06/19/17 0911     WBC 10.8 K/cmm      RBC 4.05 M/cmm      Hemoglobin 13.1 gm/dL      Hematocrit 73.2 %  MCV 106 (H) fL      MCH 32 pg      MCHC 31 (L) gm/dL      RDW 16.1 (H) %      PLT CT 226 K/cmm      MPV 8.7 fL      NEUTROPHIL % 81.4 (H) %      Lymphocytes 15.5 %      Monocytes 1.9 (L) %      Eosinophils % 0.6 %      Basophils % 0.6 %      Neutrophils Absolute 8.8 (H) K/cmm      Lymphocytes Absolute 1.7 K/cmm      Monocytes Absolute 0.2 K/cmm      Eosinophils Absolute 0.1 K/cmm      BASO Absolute 0.1 K/cmm     Pleural Fluid Cell Count [[id number]] Collected:  06/18/17 1200    Specimen:  Body Fluid Updated:  06/18/17 1504     Fluid Type: Pleural     Total NUCLEATED CELL COUNT 7,423 /cmm      Body Fluid RBC 1,501 /cmm      Neutrophil Count, Fluid 89 %      Body Fluid Lymphocytes 7 %      Mono, Fluid 2 %      Body Fluid Eosinophils 2 %      Other Cells: SEE COMMENT    Pleural Fluid Protein [[id number]] Collected:  06/18/17 1200    Specimen:  Body Fluid Updated:  06/18/17 1333     Body Fluid Protein 2.5 gm/dL      Fluid Type: Pleural    Pleural Fluid Glucose [[id number]]  Collected:  06/18/17 1200    Specimen:  Body Fluid Updated:  06/18/17 1333     Body Fluid Glucose 128 mg/dL      Fluid Type: Pleural    CBC with differential [[id number]]  (Abnormal) Collected:  06/18/17 0835    Specimen:  Blood from Blood Updated:  06/18/17 1016     WBC 12.1 (H) K/cmm      RBC 4.15 M/cmm      Hemoglobin 13.4 gm/dL      Hematocrit 52.8 %      MCV 106 (H) fL      MCH 33 pg      MCHC 31 (L) gm/dL      RDW 41.3 (H) %      PLT CT 238 K/cmm      MPV 8.4 fL      NEUTROPHIL % 81.9 (H) %      Lymphocytes 12.9 (L) %      Monocytes 4.9 %      Eosinophils % 0.0 %      Basophils % 0.4 %      Neutrophils Absolute 9.9 (H) K/cmm      Lymphocytes Absolute 1.6 K/cmm      Monocytes Absolute 0.6 K/cmm      Eosinophils Absolute 0.0 K/cmm      BASO Absolute 0.0 K/cmm      RBC Morphology RBC Morphology Reviewed     Macrocytic 2+     Polychromasia 1+     Poikilocytosis 1+     Target Cells 1+    Basic Metabolic Panel [[id number]]  (Abnormal) Collected:  06/18/17 0835    Specimen:  Plasma Updated:  06/18/17 0945     Sodium 138 mMol/L      Potassium 3.5 mMol/L      Chloride 101 mMol/L  CO2 28.6 mMol/L      Calcium 8.7 mg/dL      Glucose 956 (H) mg/dL      Creatinine 2.13 mg/dL      BUN 23 (H) mg/dL      Anion Gap 08.6 mMol/L      BUN/Creatinine Ratio 25.8 Ratio      EGFR 59 (L) mL/min/1.18m2      Osmolality Calc 280 mOsm/kg     Magnesium [[id number]] Collected:  06/18/17 0835    Specimen:  Plasma Updated:  06/18/17 0945     Magnesium 1.6 mg/dL     Phosphorus [[id number]] Collected:  06/18/17 0835    Specimen:  Plasma Updated:  06/18/17 0945     Phosphorus 3.6 mg/dL     Serum LDH [[id number]]  (Abnormal) Collected:  06/17/17 1849    Specimen:  Plasma Updated:  06/17/17 1939     LDH 550 (H) U/L     MRSA Detection by DNA Amp. (Nares) [[id number]] Collected:  06/17/17 1655    Specimen:  Nares from Nares Updated:  06/17/17 1821     MRSA No MRSA detected.    I-Stat Lactic Acid Notification [facility], [facility], [facility]  only) [[id number]]  Collected:  06/17/17 1545    Specimen:  ISTAT Updated:  06/17/17 1623     I-STAT Notification Istat Notification    i-Stat Lactic AcID [[id number]] Collected:  06/17/17 1529    Specimen:  Venipuncture Updated:  06/17/17 1532     Room Number, ISTAT 18     Sample, ISTAT Venous     Site, ISTAT OTHER     i-STAT Lactic acid 1.68 mMol/L     Urinalysis w Micro and Culture if Indicated [[id number]]  (Abnormal) Collected:  06/17/17 1430    Specimen:  Urine, Random Updated:  06/17/17 1525     Color, UA Yellow     Clarity, UA Clear     Specific Gravity, UR 1.023     pH, Urine 5.0 pH      Protein, UR 30 (A) mg/dL      Glucose, UA Negative mg/dL      Ketones UA Negative mg/dL      Bilirubin, UA Negative     Blood, UA Small (A)     Nitrite, UA Negative     Urobilinogen, UA Normal mg/dL      Leukocyte Esterase, UA Negative Leu/uL      UR Micro Performed     WBC, UA 2 /hpf      RBC, UA 1 /hpf      Squam Epithel, UA 1 /hpf      Hyaline Casts, UA 6-10 (A) /lpf     Influenza A / B Rapid Test [[id number]] Collected:  06/17/17 1430    Specimen:  Nasal Wash Updated:  06/17/17 1518     Influenza A Negative     Influenza B Negative    Narrative:       Influenza A antigen detection tests are unable to distinquish between novel and seasonal influenza A.    A negative result for either Influenza A or B antigen does not exclude influenza virus infection. Clinical correlation required.    All positive influenza antigen tests (A or B) require placement of patient on droplet precaution isolation.    LFT [[id number]]  (Abnormal) Collected:  06/17/17 1330    Specimen:  Plasma Updated:  06/17/17 1451     Protein, Total 5.9 (L) gm/dL      Albumin 2.7 (L) gm/dL  Alkaline Phosphatase 81 U/L      ALT 16 U/L      AST (SGOT) 40 U/L      Bilirubin, Total 0.7 mg/dL      Bilirubin, Direct 0.4 (H) mg/dL      Albumin/Globulin Ratio 0.84 Ratio      Globulin 3.2 gm/dL     APTT [[id number]] Collected:  06/17/17 1330    Specimen:  Blood Updated:  06/17/17 1408      aPTT 25.5 sec     CBC and differential [[id number]]  (Abnormal) Collected:  06/17/17 1330    Specimen:  Blood from Blood Updated:  06/17/17 1401     WBC 9.2 K/cmm      RBC 4.16 M/cmm      Hemoglobin 13.8 gm/dL      Hematocrit 78.2 %      MCV 102 (H) fL      MCH 33 pg      MCHC 33 gm/dL      RDW 95.6 (H) %      PLT CT 256 K/cmm      MPV 8.5 fL      NEUTROPHIL % 85.0 (H) %      Lymphocytes 9.7 (L) %      Monocytes 4.7 %      Eosinophils % 0.1 %      Basophils % 0.4 %      Neutrophils Absolute 7.8 K/cmm      Lymphocytes Absolute 0.9 K/cmm      Monocytes Absolute 0.4 K/cmm      Eosinophils Absolute 0.0 K/cmm      BASO Absolute 0.0 K/cmm           Imaging, reviewed and are significant for:  XR Chest 2 Views   Final Result   Cardiomegaly with bilateral pleural effusions right greater than left. Interval improvement in right effusion as compared to 06/17/2017. Right chest tube in place with no observed pneumothorax.      ReadingStation:[email or web address]      Echocardiogram Adult Complete W Clr/ Dopp Waveform   Final Result      XR Chest AP Portable   Final Result   The right chest tube has been adjusted, with a mild decrease in the size of the large right pleural effusion. There is no pneumothorax.      ReadingStation:[email or web address]            Microbiology, reviewed and are significant for:  Microbiology Results     Procedure Component Value Units Date/Time    Blood Culture - Venipuncture  # 2 [[id number]] Collected:  06/17/17 1849    Specimen:  Blood from Venipuncture Updated:  06/19/17 1335    Narrative:       Specimen/Source: Blood/Venipuncture  Collected: 06/17/2017 18:49     Status: Valued      Last Updated: 06/19/2017 13:34                Culture Result (Prelim)      No Growth To Date          Blood Culture - Venipuncture # 1 [[id number]] Collected:  06/17/17 1331    Specimen:  Blood from Venipuncture Updated:  06/19/17 1403    Narrative:       Specimen/Source: Blood/Venipuncture  Collected: 06/17/2017 13:31     Status: Final      Last  Updated: 06/19/2017 14:03                Gram Stain (Final)  1 of 2 Cultures Positive      Results called and read back by (licensed clinician/date/time/tech)      Karolee Stamps BISPO RN 06/18/17 0200 80.        Gram Stain  Gram Positive Cocci                    In Chains       Blood Culture Gram Positive Panel (Final)      Verigene Blood Culture Panel      Streptococcus species Detected by NAT      Interpretive Comment:  Antimicrobial resistance may be present by      mechanisms not detected by Nucleic acid capture and probe detection      methodology (NAT).  Culture results to follow.      Results called and read back by (licensed clinician/date/time/tech):      [facility] 06-18-17 [phone] #43       ISO #1 (Final)      Beta Hemolytic Streptococcus Group C      If Penicillin sensitive, Cefazolin sensitive.                               ISO #1                            Beta Hemolytic                            Streptococcus Group C                            ---------------------    MIC (mcg/ml)      Clindamycin (CD)      >=1              R      Erythromycin (E)                       I      Penicillin (P)        <=0.06           S      Vancomycin (V)        0.25             S          Influenza A / B Rapid Test [[id number]] Collected:  06/17/17 1430    Specimen:  Nasal Wash Updated:  06/17/17 1518     Influenza A Negative     Influenza B Negative     Comment: Method: Jarvis Morgan    The sensitivity for this method is between 90% and 95% for Influenza A and around 90% for Influenza B. The specificity for both Influenza A and B is around 96%. False positive results may occur, especially when the prevalence of Influenza activity is low. This is more likely with Influenza B due to its lower prevalence. Clinical conditions, including the prevalence of influenza activity, should be considered in the interpretation of results. If clinically indicated, results may be confirmed with PCR testing.  The above 2 analytes were  performed by [facility] [id number])  [address] [address]         Narrative:       Influenza A antigen  detection tests are unable to distinquish between novel and seasonal influenza A.    A negative result for either Influenza A or B antigen does not exclude influenza virus infection. Clinical correlation required.    All positive influenza antigen tests (A or B) require placement of patient on droplet precaution isolation.    MRSA Detection by DNA Amp. (Nares) [[id number]] Collected:  06/17/17 1655    Specimen:  Nares from Nares Updated:  06/17/17 1821     MRSA No MRSA detected.     Comment: The above 1 analytes were performed by [facility] [id number]  [address],[place] [address]         Pleural Fluid Culture and Smear [[id number]] Collected:  06/18/17 1200    Specimen:  Pleural from Pleural Fluid Updated:  06/20/17 1251    Narrative:       Specimen/Source: Pleural/Pleural Fluid  Collected: 06/18/2017 12:00     Status: Final      Last Updated: 06/20/2017 12:51                Gram Stain (Final)      Many WBC's Seen      No Organisms Seen      Specimen performed by cytocentrifugation methodology       ISO #1 (Final)      Moderate Growth      Beta Hemolytic Streptococcus Group C      If Penicillin sensitive, Cefazolin sensitive.                               ISO #1                            Beta Hemolytic                            Streptococcus Group C                            ---------------------    MIC (mcg/ml)      Clindamycin (CD)      >=1              R      Erythromycin (E)                       I      Penicillin (P)        <=0.06           S      Vancomycin (V)        0.25             S          Urine Culture [[id number]] Collected:  06/17/17 1430    Specimen:  Urine from Clean Catch Updated:  06/20/17 1400    Narrative:       Specimen/Source: Urine Specimens/Clean Catch  Collected: 06/17/2017 14:30     Status: Final      Last Updated:  06/20/2017 14:00                ISO #1 (Final)      Less than 10,000 CFU/mL      Beta Hemolytic Streptococcus Group C      If Penicillin sensitive, Cefazolin sensitive.  ISO #1                            Beta Hemolytic                            Streptococcus Group C                            ---------------------    MIC (mcg/ml)      Penicillin (P)        <=0.06           S      Vancomycin (V)        0.5              S                Assessment:   Group C strep urinary tract infection with secondary bacteremia and subsequent infection of chronic right pleural effusion. Do not feel patient has primary empyema  Thrush    Plan:   Mycelex troche  Continue ceftriaxone anticipate discharge on 4/8 on amoxicillin 875 mg by mouth twice a day for 7 days  Signed by: Rosiland Oz, MD

## 2017-06-21 NOTE — Progress Notes (Signed)
Pleurex drain with 1000 ml output.

## 2017-06-21 NOTE — Progress Notes (Signed)
Patient hooked up to pleurex drainage. No return yet.

## 2017-06-21 NOTE — Plan of Care (Addendum)
NURSE NOTE SUMMARY  [facility] MEDICAL OVERFLOW   Patient Name: Kristina [name]   Attending Physician: Donnie Coffin, DO   Today's date:   06/21/2017 LOS: 4 days   Shift Summary:                                                              7:23 PM Report received. Assumed care of patient. Assessment completed. Pt A&Ox3. Minimal chest discomfort after pleurex drained today. Asking for PRN tylenol. Ambulating frequently with walker. Tele intact. Call bell in reach. Will cont to monitor throughout shift.        Provider Notifications:      Rapid Response Notifications:  Mobility:      PMP Activity: Step 6 - Walks in Room (06/21/2017  8:46 PM)   Weight tracking:  Family Dynamic:   No data found.       Healthcare Agent's Name: Tara Rud  Healthcare Agent's Phone Number: [phone]   Recent Vitals Last Bowel Movement   BP 140/90   Pulse (!) 55   Temp 97.7 F (36.5 C) (Oral)   Resp 20   Ht 1.6 m (5\' 3" )   Wt 50.3 kg (110 lb 14.3 oz)   SpO2 92%   BMI 19.64 kg/m  Last BM Date: 06/21/17            Problem: Moderate/High Fall Risk Score >5  Goal: Patient will remain free of falls  Outcome: Progressing      Problem: Inadequate Tissue Perfusion  Goal: Adequate tissue perfusion will be maintained  Outcome: Progressing   06/21/17 2047   Goal/Interventions addressed this shift   Adequate tissue perfusion will be maintained Monitor/assess vital signs;Monitor/assess lab values and report abnormal values;Monitor/assess neurovascular status (pulses, capillary refill, pain, paresthesia, paralysis, presence of edema);Monitor intake and output;Monitor/assess for signs of VTE (edema of calf/thigh redness, pain);Monitor for signs and symptoms of a pulmonary embolism (dyspnea, tachypnea, tachycardia, confusion);VTE Prevention: Administer anticoagulant(s) and/or apply anti-embolism stockings/devices as ordered;Encourage/assist patient as needed to turn, cough, and perform deep breathing every 2  hours;Reinforce use of ordered respiratory interventions (i.e. CPAP, BiPAP, Incentive Spirometer, Acapella, etc.);Perform active/passive ROM;Increase mobility as tolerated/progressive mobility;Elevate feet;Position patient for maximum circulation/cardiac output;Assess and monitor skin integrity

## 2017-06-22 LAB — CBC AND DIFFERENTIAL
Bands: 1 % (ref 0–10)
Basophils %: 0 % (ref 0.0–3.0)
Basophils Absolute: 0 10*3/uL (ref 0.0–0.3)
Eosinophils %: 4 % (ref 0.0–7.0)
Eosinophils Absolute: 0.4 10*3/uL (ref 0.0–0.8)
Hematocrit: 42.1 % (ref 36.0–48.0)
Hemoglobin: 13.2 gm/dL (ref 12.0–16.0)
Lymphocytes Absolute: 1 10*3/uL (ref 0.6–5.1)
Lymphocytes: 11 % — ABNORMAL LOW (ref 15.0–46.0)
MCH: 32 pg (ref 28–35)
MCHC: 31 gm/dL — ABNORMAL LOW (ref 32–36)
MCV: 103 fL — ABNORMAL HIGH (ref 80–100)
MPV: 8.2 fL (ref 6.0–10.0)
Metamyelocytes: 2 % — ABNORMAL HIGH (ref 0–0)
Monocytes Absolute: 0.7 10*3/uL (ref 0.1–1.7)
Monocytes: 7 % (ref 3.0–15.0)
Neutrophils %: 75 % (ref 42.0–78.0)
Neutrophils Absolute: 7.4 10*3/uL (ref 1.7–8.6)
Nucleated RBC: 1 /100 WBCs (ref 0–10)
PLT CT: 299 10*3/uL (ref 130–440)
RBC: 4.09 10*6/uL (ref 3.80–5.00)
RDW: 15.8 % — ABNORMAL HIGH (ref 11.0–14.0)
WBC: 9.5 10*3/uL (ref 4.0–11.0)

## 2017-06-22 LAB — BASIC METABOLIC PANEL
Anion Gap: 11.4 mMol/L (ref 7.0–18.0)
BUN / Creatinine Ratio: 24.7 Ratio (ref 10.0–30.0)
BUN: 20 mg/dL (ref 7–22)
CO2: 27.5 mMol/L (ref 20.0–30.0)
Calcium: 8.5 mg/dL (ref 8.5–10.5)
Chloride: 98 mMol/L (ref 98–110)
Creatinine: 0.81 mg/dL (ref 0.60–1.20)
EGFR: 66 mL/min/{1.73_m2} (ref 60–150)
Glucose: 90 mg/dL (ref 71–99)
Osmolality Calc: 269 mOsm/kg — ABNORMAL LOW (ref 275–300)
Potassium: 3.9 mMol/L (ref 3.5–5.3)
Sodium: 133 mMol/L — ABNORMAL LOW (ref 136–147)

## 2017-06-22 LAB — CBC
Hematocrit: 45.3 % (ref 36.0–48.0)
Hemoglobin: 14.7 gm/dL (ref 12.0–16.0)
MCH: 34 pg (ref 28–35)
MCHC: 33 gm/dL (ref 32–36)
MCV: 104 fL — ABNORMAL HIGH (ref 80–100)
MPV: 8.3 fL (ref 6.0–10.0)
PLT CT: 339 10*3/uL (ref 130–440)
RBC: 4.37 10*6/uL (ref 3.80–5.00)
RDW: 16.3 % — ABNORMAL HIGH (ref 11.0–14.0)
WBC: 13.8 10*3/uL — ABNORMAL HIGH (ref 4.0–11.0)

## 2017-06-22 NOTE — Progress Notes (Signed)
PROGRESS NOTE - [facility]    Date Time: 06/22/17 4:01 PM  Patient Name: Kristina [name]  Attending Physician: Herb Grays  MRN: [id number]       DOB: Mar 06, 1930      Assessment and Plan:                                                                                                 [facility]        Active Problems:    Pleural effusion    Sepsis    Bacteremia due to UTI growing Blood VW:UJWJX C strep  Cont rocephin- transition to oral monday  We'll transition to oral 06/22/17    Pleural effusion, Causes likely associated with cardiomyopathy of left heart and right heart failure  Hx ofParapneumonic effusion, Considerinfectious cause due to fever and fluid in [name] lung  EF 30-35%. There is global hypokinesis.   ECHO showing both LV failure EF 40% and RV failure withtricuspid regurgitation (12/13)  Pleural catheter placed on 06/05/2017, drained for 1L    PAF (paroxysmal atrial fibrillation)  RVR resolved rate controlled  Cardizem po 300mg , lowered SBP parameters  Cont eliquis;    Hx of Cerebral infarction with hemorrhagic conversion   Monitor Clinically     Right/Left Congestive Heart Failure  Lasix 40mg     HypokalemiaResolved  Replete as needed    HTN (hypertension)  No anti-hypertensive at this time    HLD  Not on statin since previous admission    Hypothyroidism  continue levothyroxin qd    Rheumatoid Arthritis  -Methotrexate    Depression  Psychiatry rec. Remeron,due to sleepiness dose reduced to 7.5mg  daily        Nutrition: Regular Diet  GI Prophylaxis: not indicated  DVT Prophylaxis: elqiuis  Code Status: do not resuscitate     Medical management         Subjective                                                                                      [facility]     Patient states she's feeling much better. She is eager to go home.    Review of Systems:    Constitutional:  Denies fever or fatigue.    ENT:  Denies sore throat or trouble swallowing.    CV:   Denies chest pain or palpitations.    Respiratory:  Denies SOB or cough.    GI:  Denies nausea, vomiting, diarrhea, constipation or abdominal pain.    GU:  Denies pain or burning with urination.    MS:  Denies new muscle or joint pain.    Skin:  Denies rash.    Heme: Denies any bleeding.    Physical Exam:  [facility]   Patient Vitals for the past 24 hrs:   BP Temp Temp src Pulse Resp SpO2   06/22/17 1118 111/60 97.9 F (36.6 C) Oral 82 18 94 %   06/22/17 1117 - 97.9 F (36.6 C) Oral (!) 46 17 (!) 82 %   06/22/17 0804 109/73 97.7 F (36.5 C) Oral 99 17 92 %   06/22/17 0326 122/83 - - 90 - -   06/22/17 0325 (!) 88/62 97.5 F (36.4 C) Oral 96 18 97 %   06/21/17 2240 95/57 - - 90 - -   06/21/17 2238 (!) 77/45 (!) 96.8 F (36 C) Oral (!) 109 18 94 %   06/21/17 1919 140/90 97.7 F (36.5 C) Oral (!) 55 20 92 %   06/21/17 1603 103/69 97.7 F (36.5 C) Oral 68 18 94 %        Intake/Output Summary (Last 24 hours) at 06/22/17 1601  Last data filed at 06/22/17 0865   Gross per 24 hour   Intake              100 ml   Output                0 ml   Net              100 ml     Wt Readings from Last 1 Encounters:   06/17/17 50.3 kg (110 lb 14.3 oz)        Constitutional: Well groomed and in no acute distress.    Psych: Alert and oriented X2. Affect is not depressed/anxious.    Eyes: Conjunctiva is pink without purulent drainage.    ENT: Hearing is grossly normal. Mucosa is moist.    CV: Heart shows regular rate and rhythm. Pedal pulses are 2+. Pedal edema absent.    Lungs: Clear to ascultation. No accessory muscle use.    Abd: No masses. No tenderness. Bowel sounds present. No organomegaly.    MS: she is able to ambulate.     Skin: No rash. No nodules.    Drain c/d/i      Meds:                                                                                              [facility]     Estimated Creatinine Clearance: 40.3 mL/min (based on  SCr of 0.81 mg/dL).    Current Facility-Administered Medications   Medication Dose Route Frequency   . apixaban  2.5 mg Oral Q12H SCH   . brimonidine-timolol  1 drop Right Eye BID   . cefTRIAXone  2 g Intravenous Q24H   . clotrimazole  10 mg Oral TID   . dilTIAZem  300 mg Oral QAM   . docusate sodium  100 mg Oral Daily   . folic acid  1 mg Oral Daily   . furosemide  40 mg Oral Daily   . lactobacillus species  50 Billion CFU Oral Daily   . latanoprost  1 drop Right Eye QHS   . levothyroxine  88 mcg Oral Daily at 0600   .  magnesium oxide  400 mg Oral Daily   . [START ON 06/24/2017] methotrexate  10 mg Oral Weekly   . mirtazapine  7.5 mg Oral QHS   . predniSONE  5 mg Oral Daily   . senna-docusate  2 tablet Oral QHS   . sodium chloride (PF)  3 mL Intravenous Q8H       PRN medications: acetaminophen **OR** acetaminophen **OR** acetaminophen, albuterol, fluticasone, loperamide, naloxone, nitroglycerin    IV Drips:        Labs and Imaging:                                                                        [facility]     RECENT LABS (from the last 7 days)    Recent Labs  Lab 06/22/17  1524 06/22/17  0658   WBC 13.8* 9.5   Hemoglobin 14.7 13.2   Hematocrit 45.3 42.1   PLT CT 339 299       Recent Labs  Lab 06/17/17  1330   aPTT 25.5              Recent Labs  Lab 06/22/17  0658 06/21/17  0620   Glucose 90 77   Sodium 133* 133*   Potassium 3.9 4.0   Chloride 98 101   CO2 27.5 21.8   BUN 20 21   Creatinine 0.81 0.75   EGFR 66 73   Calcium 8.5 8.9       Recent Labs  Lab 06/20/17  1110 06/19/17  0808   Magnesium 1.6 1.9   Phosphorus 2.8 3.6       Recent Labs  Lab 06/17/17  1330   Albumin 2.7*   Protein, Total 5.9*   Bilirubin, Total 0.7   Alkaline Phosphatase 81   ALT 16   AST (SGOT) 40      Recent Labs  Lab 06/17/17  1430   Specific Gravity, UR 1.023   pH, Urine 5.0   Protein, UR 30*   Glucose, UA Negative   Ketones UA Negative   Bilirubin, UA Negative   Blood, UA Small*   Nitrite, UA Negative   Urobilinogen, UA Normal    Leukocyte Esterase, UA Negative   WBC, UA 2   RBC, UA 1      Lab Results   Component Value Date    HGBA1CPERCNT 5.7 02/24/2017          Imaging, reviewed and are significant for:  XR Chest 2 Views   Final Result   Cardiomegaly with bilateral pleural effusions right greater than left. Interval improvement in right effusion as compared to 06/17/2017. Right chest tube in place with no observed pneumothorax.      ReadingStation:[email or web address]      Echocardiogram Adult Complete W Clr/ Dopp Waveform   Final Result      XR Chest AP Portable   Final Result   The right chest tube has been adjusted, with a mild decrease in the size of the large right pleural effusion. There is no pneumothorax.      ReadingStation:[email or web address]          Microbiology Results     Procedure Component Value Units Date/Time    Blood Culture - Venipuncture  #  2 [[id number]] Collected:  06/17/17 1849    Specimen:  Blood from Venipuncture Updated:  06/19/17 1335    Narrative:       Specimen/Source: Blood/Venipuncture  Collected: 06/17/2017 18:49     Status: Valued      Last Updated: 06/19/2017 13:34                Culture Result (Prelim)      No Growth To Date          Blood Culture - Venipuncture # 1 [[id number]] Collected:  06/17/17 1331    Specimen:  Blood from Venipuncture Updated:  06/19/17 1403    Narrative:       Specimen/Source: Blood/Venipuncture  Collected: 06/17/2017 13:31     Status: Final      Last Updated: 06/19/2017 14:03                Gram Stain (Final)      1 of 2 Cultures Positive      Results called and read back by (licensed clinician/date/time/tech)      Delia Chimes RN 06/18/17 0200 80.        Gram Stain  Gram Positive Cocci                    In Chains       Blood Culture Gram Positive Panel (Final)      Verigene Blood Culture Panel      Streptococcus species Detected by NAT      Interpretive Comment:  Antimicrobial resistance may be present by      mechanisms not detected by Nucleic acid capture and probe detection      methodology (NAT).   Culture results to follow.      Results called and read back by (licensed clinician/date/time/tech):      [facility] 06-18-17 945 #43       ISO #1 (Final)      Beta Hemolytic Streptococcus Group C      If Penicillin sensitive, Cefazolin sensitive.                               ISO #1                            Beta Hemolytic                            Streptococcus Group C                            ---------------------    MIC (mcg/ml)      Clindamycin (CD)      >=1              [name]      Erythromycin (E)                       I      Penicillin (P)        <=0.06           S      Vancomycin (V)        0.25             S          Influenza A / B  Rapid Test [[id number]] Collected:  06/17/17 1430    Specimen:  Nasal Wash Updated:  06/17/17 1518     Influenza A Negative     Influenza B Negative     Comment: Method: Jarvis Morgan    The sensitivity for this method is between 90% and 95% for Influenza A and around 90% for Influenza B. The specificity for both Influenza A and B is around 96%. False positive results may occur, especially when the prevalence of Influenza activity is low. This is more likely with Influenza B due to its lower prevalence. Clinical conditions, including the prevalence of influenza activity, should be considered in the interpretation of results. If clinically indicated, results may be confirmed with PCR testing.  The above 2 analytes were performed by [facility] [phone])  [address] [address]         Narrative:       Influenza A antigen detection tests are unable to distinquish between novel and seasonal influenza A.    A negative result for either Influenza A or B antigen does not exclude influenza virus infection. Clinical correlation required.    All positive influenza antigen tests (A or B) require placement of patient on droplet precaution isolation.    MRSA Detection by DNA Amp. (Nares) [[id number]] Collected:  06/17/17 1655    Specimen:  Nares from  Nares Updated:  06/17/17 1821     MRSA No MRSA detected.     Comment: The above 1 analytes were performed by [facility] [phone]  [address],[place] [address]         Pleural Fluid Culture and Smear [[id number]] Collected:  06/18/17 1200    Specimen:  Pleural from Pleural Fluid Updated:  06/20/17 1251    Narrative:       Specimen/Source: Pleural/Pleural Fluid  Collected: 06/18/2017 12:00     Status: Final      Last Updated: 06/20/2017 12:51                Gram Stain (Final)      Many WBC's Seen      No Organisms Seen      Specimen performed by cytocentrifugation methodology       ISO #1 (Final)      Moderate Growth      Beta Hemolytic Streptococcus Group C      If Penicillin sensitive, Cefazolin sensitive.                               ISO #1                            Beta Hemolytic                            Streptococcus Group C                            ---------------------    MIC (mcg/ml)      Clindamycin (CD)      >=1              [name]      Erythromycin (E)                       I  Penicillin (P)        <=0.06           S      Vancomycin (V)        0.25             S          Urine Culture [[id number]] Collected:  06/17/17 1430    Specimen:  Urine from Clean Catch Updated:  06/20/17 1400    Narrative:       Specimen/Source: Urine Specimens/Clean Catch  Collected: 06/17/2017 14:30     Status: Final      Last Updated: 06/20/2017 14:00                ISO #1 (Final)      Less than 10,000 CFU/mL      Beta Hemolytic Streptococcus Group C      If Penicillin sensitive, Cefazolin sensitive.                               ISO #1                            Beta Hemolytic                            Streptococcus Group C                            ---------------------    MIC (mcg/ml)      Penicillin (P)        <=0.06           S      Vancomycin (V)        0.5              S                  Donnie Coffin, DO  [facility], [place]  [address]  [place], [id number]  Pager  [phone]        This note was generated within an electronic medical record, and portions of it may have been completed with voice recognition software. Typographical, word substitution, pronoun and other language errors may occur. If there are substantial concerns about the content of this note that may affect patient care, please contact the author for clarification.

## 2017-06-22 NOTE — Plan of Care (Signed)
Problem: Inadequate Tissue Perfusion  Goal: Adequate tissue perfusion will be maintained  Outcome: Progressing  Patient alert and oriented no acute distress noted. Patient requested immodium medicated per standing order and laxatives held patient received last night.

## 2017-06-22 NOTE — Plan of Care (Signed)
Problem: Inadequate Tissue Perfusion  Goal: Adequate tissue perfusion will be maintained  Outcome: Progressing

## 2017-06-23 LAB — MAGNESIUM: Magnesium: 1.8 mg/dL (ref 1.6–2.6)

## 2017-06-23 LAB — PHOSPHORUS: Phosphorus: 3 mg/dL (ref 2.3–4.7)

## 2017-06-23 MED ORDER — CLOTRIMAZOLE 10 MG MT LOZG
10.00 mg | LOZENGE | Freq: Three times a day (TID) | OROMUCOSAL | 0 refills | Status: AC
Start: 2017-06-23 — End: 2017-06-27

## 2017-06-23 MED ORDER — AMOXICILLIN 875 MG PO TABS
875.00 mg | ORAL_TABLET | Freq: Two times a day (BID) | ORAL | Status: DC
Start: 2017-06-23 — End: 2017-06-23
  Administered 2017-06-23: 11:00:00 875 mg via ORAL
  Filled 2017-06-23 (×2): qty 1

## 2017-06-23 MED ORDER — AMOXICILLIN 875 MG PO TABS
875.00 mg | ORAL_TABLET | Freq: Two times a day (BID) | ORAL | 0 refills | Status: AC
Start: 2017-06-24 — End: 2017-07-01

## 2017-06-23 MED ORDER — DILTIAZEM HCL ER COATED BEADS 300 MG PO CP24
300.00 mg | ORAL_CAPSULE | Freq: Every morning | ORAL | 0 refills | Status: AC
Start: 2017-06-24 — End: 2017-07-24

## 2017-06-23 MED ORDER — APIXABAN 2.5 MG PO TABS
2.50 mg | ORAL_TABLET | Freq: Two times a day (BID) | ORAL | 0 refills | Status: AC
Start: 2017-06-23 — End: 2017-07-23

## 2017-06-23 MED ORDER — VH BIO-K PLUS PROBIOTIC 50 BIL CFU CAPSULE
50.00 | DELAYED_RELEASE_CAPSULE | Freq: Every day | ORAL | 0 refills | Status: AC
Start: 2017-06-24 — End: 2017-07-15

## 2017-06-23 NOTE — Discharge Summary (Signed)
DISCHARGE SUMMARY     Patient Name: Kristina [name]  Attending Physician: Donnie Coffin, DO  Primary Care Physician: Ellender Hose, MD    Date of Admission: 06/17/2017  Date of Discharge: 06/23/2017  Length of Stay in the Hospital: 6    Discharge Diagnoses:                                                                      [facility] Problems    Diagnosis POA   . Pleural effusion Yes   . Sepsis Yes      Resolved Hospital Problems    Diagnosis POA   No resolved problems to display.       Bacteremia due to UTI growing Blood ZO:XWRUE C strep  Cont rocephin- transition to oral monday  We'll transition to oral 06/22/17    Pleural effusion, Causes likely associated with cardiomyopathy of left heart and right heart failure  Hx ofParapneumonic effusion, Considerinfectious cause due to fever and fluid in R lung  EF 30-35%. There is global hypokinesis.   ECHO showing both LV failure EF 40% and RV failure withtricuspid regurgitation (12/13)  Pleural catheter placed on 06/05/2017, drained for 1L    PAF (paroxysmal atrial fibrillation)  RVR resolved rate controlled  Cardizem po 300mg , lowered SBP parameters  Cont eliquis;    Hx of Cerebral infarction with hemorrhagic conversion   Monitor Clinically     Right/Left Congestive Heart Failure  Lasix 40mg     HypokalemiaResolved  Replete as needed    HTN (hypertension)  No anti-hypertensive at this time    HLD  Not on statin since previous admission    Hypothyroidism  continue levothyroxin qd    Rheumatoid Arthritis  -Methotrexate    Depression  Psychiatry rec. Remeron,due to sleepiness dose reduced to 7.5mg  daily        Nutrition: Regular Diet  GI Prophylaxis: not indicated  DVT Prophylaxis: elqiuis  Code Status: do not resuscitate    Discharge Condition: Stable and improved.        Admission H&P summary:                                                                 [facility]   For detailed presentng complaints  and findings at admission, please refer to H & P  Of this admission.      Kristina [name] is a 84 y.o. female who presents to the hospital c/o of Fever transferred from [facility] for evaluation.  Patient has no complaints at bedside, states she feels fine and just had some green tea.  Patient has no specific complaints including no chest pain, cough, abdominal pain, vomiting, diarrhea, headache, sore throat, urinary symptoms or rashes.    Pt mentioned that she has paramedics/home health nurses that come twice weekly to drain her Park pleural catheter, the device used is a Accel drainage tube.       Hospital Course:  [facility]     Brief presenting complaint and course of Hospital stay:     Pt was admitted on 06/17/17 with transfer from [facility] for fever.    ID  Pt presented with early signs of Sepsis.  Result of cx grew  Group C strep urinary tract infection with secondary bacteremia and subsequent infection of chronic right pleural effusion.  Pt was empirically treated with Vanc/Zosyn transitioned to Ceftriaxone for a total of 6 days of IV abx and transitioned to Amoxicillin 875mg  BID for 7 more days.  One episode of thrush with hx of IV abx use will provide 4 day course of clotrimazole troche.  With probiotics.      Cardio  Increased Diltiazem dose from 240mg  to 300mg  due to HR ranging from 120'2-140's. Restarted low does Eliquis 2.5mg  BID now that she wont need consistent thoracocentesis.  It has been 3 months since the hemorrhagic stroke discussed with Neurology team.      Discharge Day Exam:  Temp:  [97.5 F (36.4 C)-98.6 F (37 C)] 97.9 F (36.6 C)  Heart Rate:  [46-83] 83  Resp Rate:  [16-20] 16  BP: (92-141)/(58-80) 141/76  Wt Readings from Last 3 Encounters:   06/17/17 50.3 kg (110 lb 14.3 oz)   06/05/17 51.7 kg (114 lb)   03/19/17 57.2 kg (126 lb)       Constitutional: She is oriented to person, place, and time. She  appears well-developedand well-nourished. No distress.   HENT:   Head: Normocephalicand atraumatic.   Eyes: Pupils are equal, round, and reactive to light. Conjunctivaeare normal.   Neck: Normal range of motion. Neck supple. No JVDpresent.   Cardiovascular: Intact distal pulses. An irregularly irregular rhythmpresent. Labile Bps.  Pulmonary/Chest: Effort normal. Improved lung sounds overall   There is a right lateral pleural catheter in place without erythema.  Abdominal: Soft. There is no tenderness.   Musculoskeletal: She exhibits no edema.   Neurological: She is alertand oriented to person, place, and time.   Skin: Capillary refill takes less than 2 seconds.   Psychiatric: She has a normal mood and affect. Her behavior is normal.    Last set of labs     Recent Labs  Lab 06/22/17  1524   WBC 13.8*   Hemoglobin 14.7   Hematocrit 45.3   PLT CT 339       Recent Labs  Lab 06/22/17  0658   Sodium 133*   Potassium 3.9   Chloride 98   CO2 27.5   BUN 20   Creatinine 0.81   EGFR 66   Glucose 90   Calcium 8.5     Microbiology Results     Procedure Component Value Units Date/Time    Blood Culture - Venipuncture  # 2 [[id number]] Collected:  06/17/17 1849    Specimen:  Blood from Venipuncture Updated:  06/23/17 0610    Narrative:       Specimen/Source: Blood/Venipuncture  Collected: 06/17/2017 18:49     Status: Final      Last Updated: 06/23/2017 06:08                Culture Result (Final)      No Growth in 5 Days          Blood Culture - Venipuncture # 1 [[id number]] Collected:  06/17/17 1331    Specimen:  Blood from Venipuncture Updated:  06/19/17 1403    Narrative:       Specimen/Source: Blood/Venipuncture  Collected: 06/17/2017 13:31  Status: Final      Last Updated: 06/19/2017 14:03                Gram Stain (Final)      1 of 2 Cultures Positive      Results called and read back by (licensed clinician/date/time/tech)      Delia Chimes RN 06/18/17 0200 80.        Gram Stain  Gram Positive Cocci                     In Chains       Blood Culture Gram Positive Panel (Final)      Verigene Blood Culture Panel      Streptococcus species Detected by NAT      Interpretive Comment:  Antimicrobial resistance may be present by      mechanisms not detected by Nucleic acid capture and probe detection      methodology (NAT).  Culture results to follow.      Results called and read back by (licensed clinician/date/time/tech):      [facility] 06-18-17 945 #43       ISO #1 (Final)      Beta Hemolytic Streptococcus Group C      If Penicillin sensitive, Cefazolin sensitive.                               ISO #1                            Beta Hemolytic                            Streptococcus Group C                            ---------------------    MIC (mcg/ml)      Clindamycin (CD)      >=1              R      Erythromycin (E)                       I      Penicillin (P)        <=0.06           S      Vancomycin (V)        0.25             S          Influenza A / B Rapid Test [[id number]] Collected:  06/17/17 1430    Specimen:  Nasal Wash Updated:  06/17/17 1518     Influenza A Negative     Influenza B Negative     Comment: Method: Jarvis Morgan    The sensitivity for this method is between 90% and 95% for Influenza A and around 90% for Influenza B. The specificity for both Influenza A and B is around 96%. False positive results may occur, especially when the prevalence of Influenza activity is low. This is more likely with Influenza B due to its lower prevalence. Clinical conditions, including the prevalence of influenza activity, should be considered in the interpretation of results. If clinically indicated, results may be confirmed with PCR testing.  The above  2 analytes were performed by [facility] Main Lab [phone]  [address] [address]         Narrative:       Influenza A antigen detection tests are unable to distinquish between novel and seasonal influenza A.    A negative result for either  Influenza A or B antigen does not exclude influenza virus infection. Clinical correlation required.    All positive influenza antigen tests (A or B) require placement of patient on droplet precaution isolation.    MRSA Detection by DNA Amp. (Nares) [[id number]] Collected:  06/17/17 1655    Specimen:  Nares from Nares Updated:  06/17/17 1821     MRSA No MRSA detected.     Comment: The above 1 analytes were performed by [facility] Main Lab [phone]  [address],[place] [address]         Pleural Fluid Culture and Smear [[id number]] Collected:  06/18/17 1200    Specimen:  Pleural from Pleural Fluid Updated:  06/20/17 1251    Narrative:       Specimen/Source: Pleural/Pleural Fluid  Collected: 06/18/2017 12:00     Status: Final      Last Updated: 06/20/2017 12:51                Gram Stain (Final)      Many WBC's Seen      No Organisms Seen      Specimen performed by cytocentrifugation methodology       ISO #1 (Final)      Moderate Growth      Beta Hemolytic Streptococcus Group C      If Penicillin sensitive, Cefazolin sensitive.                               ISO #1                            Beta Hemolytic                            Streptococcus Group C                            ---------------------    MIC (mcg/ml)      Clindamycin (CD)      >=1              R      Erythromycin (E)                       I      Penicillin (P)        <=0.06           S      Vancomycin (V)        0.25             S          Urine Culture [[id number]] Collected:  06/17/17 1430    Specimen:  Urine from Clean Catch Updated:  06/20/17 1400    Narrative:       Specimen/Source: Urine Specimens/Clean Catch  Collected: 06/17/2017 14:30     Status: Final      Last Updated: 06/20/2017 14:00  ISO #1 (Final)      Less than 10,000 CFU/mL      Beta Hemolytic Streptococcus Group C      If Penicillin sensitive, Cefazolin sensitive.                               ISO #1                            Beta Hemolytic                             Streptococcus Group C                            ---------------------    MIC (mcg/ml)      Penicillin (P)        <=0.06           S      Vancomycin (V)        0.5              S                  Procedures performed:   Radiology: all results from this admission  Xr Chest 2 Views    Result Date: 06/20/2017  Cardiomegaly with bilateral pleural effusions right greater than left. Interval improvement in right effusion as compared to 06/17/2017. Right chest tube in place with no observed pneumothorax. ReadingStation:[email or web address]    Xr Chest 2 Views    Result Date: 06/05/2017  1. New right Pleurx catheter. 2. Large right pleural effusion is significantly worse compared to the prior chest x-ray. 3. Cardiomegaly is unchanged. ReadingStation:[email or web address]    Xr Chest Ap Only    Result Date: 05/29/2017  No observed pneumothorax status post right thoracentesis. Small residual right pleural effusion. Left lung is clear. Stable heart size. ReadingStation:[id number]    Xr Chest Ap Portable    Result Date: 06/17/2017  The right chest tube has been adjusted, with a mild decrease in the size of the large right pleural effusion. There is no pneumothorax. ReadingStation:[email or web address]    Drain (other)    Result Date: 06/05/2017  IMPRESSION: 1.  THORACIC PLEURX DRAINAGE CATHETER PLACEMENT. RIGHT-SIDED. 2.  1 L OF PLEURAL FLUID EVACUATED AT TIME OF PLACEMENT. ReadingStation:[id number]                Discharge Medications:                                                                    [facility]        Medication List      START taking these medications    amoxicillin 875 MG tablet  Commonly known as:  AMOXIL  Take 1 tablet (875 mg total) by mouth 2 (two) times daily for 7 days  Start taking on:  06/24/2017     apixaban 2.5 MG  Commonly known as:  ELIQUIS  Take 1 tablet (2.5 mg total) by mouth every 12 (twelve) hours     clotrimazole 10 MG troche  Take 1  tablet (10 mg total) by mouth 3 (three) times daily for 4 days     lactobacillus  species capsule  Commonly known as:  BIO-K PLUS  Take 1 capsule (50 Billion CFU total) by mouth daily for 21 days  Start taking on:  06/24/2017        CHANGE how you take these medications    dilTIAZem 300 MG 24 hr capsule  Commonly known as:  CARDIZEM CD  Take 1 capsule (300 mg total) by mouth every morning  Start taking on:  06/24/2017  What changed:   medication strength   how much to take        CONTINUE taking these medications    acetaminophen 325 MG tablet  Commonly known as:  TYLENOL     aspirin 81 MG chewable tablet  Chew 1 tablet (81 mg total) by mouth daily.     COMBIGAN 0.2-0.5 % ophthalmic solution  Generic drug:  brimonidine-timolol     fluticasone 50 MCG/ACT nasal spray  Commonly known as:  FLONASE     folic acid 1 MG tablet  Commonly known as:  FOLVITE     furosemide 40 MG tablet  Commonly known as:  LASIX  Take 1 tablet (40 mg total) by mouth daily.     lactase 3000 units tablet  Commonly known as:  LACTAID     latanoprost 0.005 % ophthalmic solution  Commonly known as:  XALATAN     levothyroxine 88 MCG tablet  Commonly known as:  SYNTHROID, LEVOTHROID     loperamide 2 MG capsule  Commonly known as:  IMODIUM     magnesium oxide 400 MG tablet  Commonly known as:  MAG-OX  Take 1 tablet (400 mg total) by mouth daily.     methotrexate 2.5 MG tablet     mirtazapine 7.5 MG tablet  Commonly known as:  REMERON  Take 1 tablet (7.5 mg total) by mouth nightly.     nitroglycerin 0.4 MG SL tablet  Commonly known as:  NITROSTAT     predniSONE 5 MG tablet  Commonly known as:  DELTASONE     PROAIR HFA 108 (90 Base) MCG/ACT inhaler  Generic drug:  albuterol           Where to Get Your Medications      You can get these medications from any pharmacy    Bring a paper prescription for each of these medications   amoxicillin 875 MG tablet   apixaban 2.5 MG   clotrimazole 10 MG troche   dilTIAZem 300 MG 24 hr capsule   lactobacillus species capsule             Discharge Instructions:                                                                    [facility]      Discharge Diet: Regular Diet       Activity: As tolerated.     Patient was instructed to follow up with:   Primary Care Doctor Lynnda Shields Laqueta Due, MD in  3 days.     Discharge Code Status: NO CPR  -  ALLOW NATURAL DEATH    Complete instructions and follow up are in the patient's  After Visit Summary (AVS).      Consultations:                                                                                    [facility]     Treatment Team: Attending Provider: Donnie Coffin, DO; Case Manager: Morton Stall, RN; Consulting Physician: Donnie Coffin, DO; Consulting Physician: Rosiland Oz, MD; Technician: Evelina Dun, CNA; Registered Nurse: Karn Cassis, RN    Procedures/Radiology performed:                                                 [facility]     Results     Procedure Component Value Units Date/Time    Blood Culture - Venipuncture  # 2 [[id number]] Collected:  06/17/17 1849    Specimen:  Blood from Venipuncture Updated:  06/23/17 0610    Narrative:       Specimen/Source: Blood/Venipuncture  Collected: 06/17/2017 18:49     Status: Final      Last Updated: 06/23/2017 06:08                Culture Result (Final)      No Growth in 5 Days          CBC [[id number]]  (Abnormal) Collected:  06/22/17 1524    Specimen:  Blood from Blood Updated:  06/22/17 1542     WBC 13.8 (H) K/cmm      RBC 4.37 M/cmm      Hemoglobin 14.7 gm/dL      Hematocrit 78.2 %      MCV 104 (H) fL      MCH 34 pg      MCHC 33 gm/dL      RDW 95.6 (H) %      PLT CT 339 K/cmm      MPV 8.3 fL         XR CHEST AP PORTABLE  XR CHEST 2 VIEWS  ECHOCARDIOGRAM ADULT COMPLETE W CLR/ DOPP WAVEFORM      No orders of the defined types were placed in this encounter.      Discharge Condition:                                                                        [facility]     The patient was discharged in stable condition.  Time spent coordinating discharge and  reviewing discharge plan:  35 minutes      Signed by: Isac Caddy, MD    [facility]  [address]  [place], [place]    CC: Lynnda Shields, Laqueta Due, MD    This note was generated within an electronic medical record, and portions of it may have been completed with voice  recognition software. Typographical, word substitution, pronoun and other language errors may occur. If there are substantial concerns about the content of this note that may affect patient care, please contact the author for clarification.

## 2017-06-23 NOTE — Plan of Care (Signed)
Problem: Moderate/High Fall Risk Score >5  Goal: Patient will remain free of falls  Outcome: Progressing

## 2017-06-24 DIAGNOSIS — I4891 Unspecified atrial fibrillation: Secondary | ICD-10-CM | POA: Diagnosis not present

## 2017-06-24 DIAGNOSIS — J90 Pleural effusion, not elsewhere classified: Secondary | ICD-10-CM | POA: Diagnosis not present

## 2017-06-24 DIAGNOSIS — M069 Rheumatoid arthritis, unspecified: Secondary | ICD-10-CM | POA: Diagnosis not present

## 2017-06-24 DIAGNOSIS — I5020 Unspecified systolic (congestive) heart failure: Secondary | ICD-10-CM | POA: Diagnosis not present

## 2017-06-24 DIAGNOSIS — I69320 Aphasia following cerebral infarction: Secondary | ICD-10-CM | POA: Diagnosis not present

## 2017-06-24 DIAGNOSIS — F329 Major depressive disorder, single episode, unspecified: Secondary | ICD-10-CM | POA: Diagnosis not present

## 2017-06-26 DIAGNOSIS — J90 Pleural effusion, not elsewhere classified: Secondary | ICD-10-CM | POA: Diagnosis not present

## 2017-06-26 DIAGNOSIS — I4891 Unspecified atrial fibrillation: Secondary | ICD-10-CM | POA: Diagnosis not present

## 2017-06-26 DIAGNOSIS — F329 Major depressive disorder, single episode, unspecified: Secondary | ICD-10-CM | POA: Diagnosis not present

## 2017-06-26 DIAGNOSIS — M069 Rheumatoid arthritis, unspecified: Secondary | ICD-10-CM | POA: Diagnosis not present

## 2017-06-26 DIAGNOSIS — I69320 Aphasia following cerebral infarction: Secondary | ICD-10-CM | POA: Diagnosis not present

## 2017-06-26 DIAGNOSIS — I5020 Unspecified systolic (congestive) heart failure: Secondary | ICD-10-CM | POA: Diagnosis not present

## 2017-06-27 DIAGNOSIS — H401132 Primary open-angle glaucoma, bilateral, moderate stage: Secondary | ICD-10-CM | POA: Diagnosis not present

## 2017-06-27 DIAGNOSIS — H2512 Age-related nuclear cataract, left eye: Secondary | ICD-10-CM | POA: Diagnosis not present

## 2017-06-27 DIAGNOSIS — H26491 Other secondary cataract, right eye: Secondary | ICD-10-CM | POA: Diagnosis not present

## 2017-06-27 DIAGNOSIS — J90 Pleural effusion, not elsewhere classified: Secondary | ICD-10-CM | POA: Diagnosis not present

## 2017-06-27 DIAGNOSIS — H348112 Central retinal vein occlusion, right eye, stable: Secondary | ICD-10-CM | POA: Diagnosis not present

## 2017-06-27 DIAGNOSIS — J189 Pneumonia, unspecified organism: Secondary | ICD-10-CM | POA: Diagnosis not present

## 2017-06-27 DIAGNOSIS — I4891 Unspecified atrial fibrillation: Secondary | ICD-10-CM | POA: Diagnosis not present

## 2017-06-27 DIAGNOSIS — A419 Sepsis, unspecified organism: Secondary | ICD-10-CM | POA: Diagnosis not present

## 2017-06-30 DIAGNOSIS — J90 Pleural effusion, not elsewhere classified: Secondary | ICD-10-CM | POA: Diagnosis not present

## 2017-06-30 DIAGNOSIS — I69320 Aphasia following cerebral infarction: Secondary | ICD-10-CM | POA: Diagnosis not present

## 2017-06-30 DIAGNOSIS — I5020 Unspecified systolic (congestive) heart failure: Secondary | ICD-10-CM | POA: Diagnosis not present

## 2017-06-30 DIAGNOSIS — I4891 Unspecified atrial fibrillation: Secondary | ICD-10-CM | POA: Diagnosis not present

## 2017-06-30 DIAGNOSIS — M069 Rheumatoid arthritis, unspecified: Secondary | ICD-10-CM | POA: Diagnosis not present

## 2017-06-30 DIAGNOSIS — F329 Major depressive disorder, single episode, unspecified: Secondary | ICD-10-CM | POA: Diagnosis not present

## 2017-07-01 ENCOUNTER — Emergency Department: Payer: Medicare Other

## 2017-07-01 ENCOUNTER — Emergency Department
Admission: EM | Admit: 2017-07-01 | Discharge: 2017-07-01 | Disposition: A | Discharge status: Home / Self Care | Payer: Medicare Other | Attending: Emergency Medicine | Admitting: Emergency Medicine

## 2017-07-01 DIAGNOSIS — Z4682 Encounter for fitting and adjustment of non-vascular catheter: Secondary | ICD-10-CM | POA: Diagnosis not present

## 2017-07-01 DIAGNOSIS — J810 Acute pulmonary edema: Secondary | ICD-10-CM | POA: Diagnosis not present

## 2017-07-01 DIAGNOSIS — I10 Essential (primary) hypertension: Secondary | ICD-10-CM | POA: Diagnosis not present

## 2017-07-01 DIAGNOSIS — J90 Pleural effusion, not elsewhere classified: Secondary | ICD-10-CM | POA: Diagnosis not present

## 2017-07-01 DIAGNOSIS — J9811 Atelectasis: Secondary | ICD-10-CM | POA: Diagnosis not present

## 2017-07-01 DIAGNOSIS — R0602 Shortness of breath: Secondary | ICD-10-CM | POA: Diagnosis not present

## 2017-07-01 DIAGNOSIS — T85698A Other mechanical complication of other specified internal prosthetic devices, implants and grafts, initial encounter: Secondary | ICD-10-CM | POA: Diagnosis not present

## 2017-07-01 DIAGNOSIS — R590 Localized enlarged lymph nodes: Secondary | ICD-10-CM | POA: Diagnosis not present

## 2017-07-01 DIAGNOSIS — T859XXA Unspecified complication of internal prosthetic device, implant and graft, initial encounter: Secondary | ICD-10-CM

## 2017-07-01 LAB — CBC AND DIFFERENTIAL
Basophils %: 0.4 % (ref 0.0–3.0)
Basophils Absolute: 0 10*3/uL (ref 0.0–0.3)
Eosinophils %: 1.4 % (ref 0.0–7.0)
Eosinophils Absolute: 0.1 10*3/uL (ref 0.0–0.8)
Hematocrit: 41.2 % (ref 36.0–48.0)
Hemoglobin: 13.2 gm/dL (ref 12.0–16.0)
Lymphocytes Absolute: 1.2 10*3/uL (ref 0.6–5.1)
Lymphocytes: 14.4 % — ABNORMAL LOW (ref 15.0–46.0)
MCH: 33 pg (ref 28–35)
MCHC: 32 gm/dL (ref 32–36)
MCV: 102 fL — ABNORMAL HIGH (ref 80–100)
MPV: 7.6 fL (ref 6.0–10.0)
Monocytes Absolute: 0 10*3/uL — ABNORMAL LOW (ref 0.1–1.7)
Monocytes: 0.2 % — ABNORMAL LOW (ref 3.0–15.0)
Neutrophils %: 83.6 % — ABNORMAL HIGH (ref 42.0–78.0)
Neutrophils Absolute: 7.2 10*3/uL (ref 1.7–8.6)
PLT CT: 297 10*3/uL (ref 130–440)
RBC: 4.04 10*6/uL (ref 3.80–5.00)
RDW: 15.8 % — ABNORMAL HIGH (ref 11.0–14.0)
WBC: 8.6 10*3/uL (ref 4.0–11.0)

## 2017-07-01 LAB — BASIC METABOLIC PANEL
Anion Gap: 14.8 mMol/L (ref 7.0–18.0)
BUN / Creatinine Ratio: 15.4 Ratio (ref 10.0–30.0)
BUN: 12 mg/dL (ref 7–22)
CO2: 25.8 mMol/L (ref 20.0–30.0)
Calcium: 9.6 mg/dL (ref 8.5–10.5)
Chloride: 97 mMol/L — ABNORMAL LOW (ref 98–110)
Creatinine: 0.78 mg/dL (ref 0.60–1.20)
EGFR: 70 mL/min/{1.73_m2} (ref 60–150)
Glucose: 110 mg/dL — ABNORMAL HIGH (ref 71–99)
Osmolality Calc: 269 mOsm/kg — ABNORMAL LOW (ref 275–300)
Potassium: 3.6 mMol/L (ref 3.5–5.3)
Sodium: 134 mMol/L — ABNORMAL LOW (ref 136–147)

## 2017-07-01 MED ORDER — SODIUM CHLORIDE 0.9 % IV BOLUS
500.00 mL | Freq: Once | INTRAVENOUS | Status: AC
Start: 2017-07-01 — End: 2017-07-01
  Administered 2017-07-01: 13:00:00 500 mL via INTRAVENOUS

## 2017-07-01 MED ORDER — SODIUM CHLORIDE 0.9 % IV BOLUS
500.00 mL | Freq: Once | INTRAVENOUS | Status: AC
Start: 2017-07-01 — End: 2017-07-01
  Administered 2017-07-01: 11:00:00 500 mL via INTRAVENOUS

## 2017-07-01 NOTE — ED Triage Notes (Signed)
Pt c/o right sided pleural catheter having decreased drainage since last evening. Patient has aid come to drain catheter 3 times per week. Last evening, aid was only drained 50cc.Daughter at bedside reports that patient has normally been draining 750cc fluid with each drainage. Pt reports having increased SOB. Denies n/v.

## 2017-07-01 NOTE — ED Notes (Signed)
MD notified regarding BP of 89/55. RN given additional v/o for 500cc NS bolus. Pt remains asymptomatic.

## 2017-07-01 NOTE — ED Notes (Signed)
Dr. Yang at bedside.

## 2017-07-01 NOTE — Discharge Instructions (Signed)
Return if further problems with the chest tube or if you develop fever, pain, shortness of breath or faintness.

## 2017-07-01 NOTE — ED Provider Notes (Addendum)
Physician/Midlevel provider first contact with patient: 07/01/17 [facility]       [facility]   EMERGENCY DEPARTMENT     Patient Name: Kristina [name], Kristina [name]  Encounter Date:  07/01/2017  Attending Physician: Emeterio Reeve, MD  PCP: Ellender Hose, MD  Patient DOB:  05-Aug-1929  MRN:  [id number]  Room:  N2/N2-A   Physician/Midlevel provider first contact with patient: 07/01/17 0809         Diagnosis / Disposition     Clinical Impression  1. Complication of chest tube, initial encounter        Disposition  ED Disposition     ED Disposition Condition Date/Time Comment    Discharge  Tue Jul 01, 2017  2:56 PM Keina R Plant discharge to home/self care.    Condition at disposition: Stable          Prescriptions  New Prescriptions    No medications on file       Primary Care or main doctor  [place], Laqueta Due, MD    Follow up  Lynnda Shields Laqueta Due, MD  [address]  [place] [address]  [phone]      As needed      MDM / Critical Care     Diagnostic Considerations:  The tube is in the right place but is not draining.    ED course:  Previous records reviewed.  D/dx, workup, anticipated clinical course discussed with patient/family.  Results reviewed with patient/family.  Patient appreciative of care.  All questions answered.  Patient/family comfortable with treatment plan.  Case reviewed with consultant; history, physical exam, ancillary studies, and plan of care discussed.  Dr. who requested a CT scan to confirm the position of the tube. Patient went to the interventionalist suite and adapter was placed on the chest tube with a device to unclog it. Subsequently the pleural fluid drained without suction and a total of 1 L was removed. Patient states that her breathing is better. Reexamination of the chest reveals much improved breath sounds on the right. Patient is hemodynamically stable.    Consultations:  Dr. Anselm Jungling, Interventionalist    Critical Care:  None    Procedures     None  Signed by: Emeterio Reeve, MD      History of Presenting Illness     Chief complaint: Drainage tube problem    HPI/ROS is limited by: none  HPI/ROS given by: patient and family    Kristina [name] is a 84 y.o. female who presents with greasy shortness of breath. No pain, fever or cough. She does have a right chest tube placed for a parapneumonic effusion following pneumonia and sepsis. The tube is draining 3 times a week by either her daughter or a home health nurse and characteristically she drinks about 750 mL with each drainage. Last night there were only able to withdraw 50 mL.    Review of Systems   Review of Systems   Constitutional: Negative.  Negative for fever.   HENT: Negative.    Eyes:        Glaucoma   Respiratory: Positive for shortness of breath. Negative for cough.    Cardiovascular: Negative.  Negative for chest pain.        History of atrial fibrillation, hypertension     Gastrointestinal: Negative.    Endocrine:        Hyperlipidemia, Hypothyroid   Genitourinary: Negative.    Musculoskeletal: Positive for arthralgias (history of RA).  Skin:        History of melanoma   Neurological:        History of stroke   All other systems reviewed and are negative.        Physical Exam     Blood pressure 99/67, pulse 96, temperature (!) 96.8 F (36 C), temperature source Tympanic, resp. rate (!) 23, height 1.6 m, weight 51 kg, SpO2 97 %.    Physical Exam   Constitutional: She is oriented to person, place, and time and well-developed, well-nourished, and in no distress.   HENT:   Head: Normocephalic and atraumatic.   Eyes: Pupils are equal, round, and reactive to light. Conjunctivae and EOM are normal.   Neck: Normal range of motion. Neck supple.   Cardiovascular: Normal rate and normal heart sounds.    Pulmonary/Chest: Effort normal. She has wheezes (bilateral).   Decreased breath sounds on the right.   Abdominal: Soft. There is no tenderness.   Musculoskeletal: Normal range of motion.   Neurological: She is alert and oriented  to person, place, and time.   Skin: Skin is warm and dry.   Psychiatric: Mood and judgment normal.   Nursing note and vitals reviewed.     Allergies     Pt is allergic to lisinopril and gluten meal.    Medications     No current facility-administered medications for this encounter.     Current Outpatient Prescriptions:   .  acetaminophen (TYLENOL) 325 MG tablet, Take 650 mg by mouth every 4 (four) hours as needed for Pain., Disp: , Rfl:   .  amoxicillin (AMOXIL) 875 MG tablet, Take 1 tablet (875 mg total) by mouth 2 (two) times daily for 7 days, Disp: 14 tablet, Rfl: 0  .  apixaban (ELIQUIS) 2.5 MG, Take 1 tablet (2.5 mg total) by mouth every 12 (twelve) hours, Disp: 60 tablet, Rfl: 0  .  aspirin 81 MG chewable tablet, Chew 1 tablet (81 mg total) by mouth daily., Disp: , Rfl:   .  brimonidine-timolol (COMBIGAN) 0.2-0.5 % ophthalmic solution, Place 1 drop into the right eye 2 (two) times daily, Disp: , Rfl:   .  dilTIAZem (CARDIZEM CD) 300 MG 24 hr capsule, Take 1 capsule (300 mg total) by mouth every morning, Disp: 30 capsule, Rfl: 0  .  fluticasone (FLONASE) 50 MCG/ACT nasal spray, 1 spray by Nasal route daily as needed for Rhinitis or Allergies.  , Disp: , Rfl:   .  folic acid (FOLVITE) 1 MG tablet, Take 1 mg by mouth daily., Disp: , Rfl:   .  furosemide (LASIX) 40 MG tablet, Take 1 tablet (40 mg total) by mouth daily., Disp: , Rfl:   .  lactase (LACTAID) 3000 units tablet, Take 1 tablet by mouth daily as needed.  , Disp: , Rfl:   .  lactobacillus species (BIO-K PLUS) capsule, Take 1 capsule (50 Billion CFU total) by mouth daily for 21 days, Disp: 21 capsule, Rfl: 0  .  latanoprost (XALATAN) 0.005 % ophthalmic solution, Place 1 drop into the right eye nightly.  , Disp: , Rfl:   .  levothyroxine (SYNTHROID, LEVOTHROID) 88 MCG tablet, Take 88 mcg by mouth Once a day at 6:00am, Disp: , Rfl:   .  loperamide (IMODIUM) 2 MG capsule, Take 2 mg by mouth 4 (four) times daily as needed for Diarrhea   , Disp: , Rfl:   .   magnesium oxide (MAG-OX) 400 MG tablet, Take 1 tablet (400 mg  total) by mouth daily., Disp: , Rfl: 0  .  methotrexate 2.5 MG tablet, Take 10 mg by mouth once a week Weekly on Tuesdays  , Disp: , Rfl:   .  mirtazapine (REMERON) 7.5 MG tablet, Take 1 tablet (7.5 mg total) by mouth nightly., Disp: , Rfl:   .  predniSONE (DELTASONE) 5 MG tablet, Take 5 mg by mouth daily, Disp: , Rfl:   .  PROAIR HFA 108 (90 Base) MCG/ACT inhaler, Inhale 2 puffs into the lungs 2 (two) times daily as needed for Wheezing or Shortness of Breath.  , Disp: , Rfl:   .  nitroglycerin (NITROSTAT) 0.4 MG SL tablet, Place 0.4 mg under the tongue every 5 (five) minutes as needed for Chest pain.  , Disp: , Rfl:      Past Medical History     Pt has a past medical history of A-fib; Aphasia; Arrhythmia; Bradycardia; Cerebral infarction; Chest pain; Congestive heart failure; Diarrhea; Disorder of thyroid; Hyperlipidemia; Hypertension; Melanoma; Rheumatoid arthritis; and Vitamin D deficiency.    Past Surgical History     Pt has a past surgical history that includes Hip surgery and arm surgery.    Family History     The family history is not on file.    Social History     Pt reports that she has never smoked. She has never used smokeless tobacco. She reports that she drinks alcohol. She reports that she does not use drugs.    Orders Placed     Orders Placed This Encounter   Procedures   . XR Chest 2 Views   . CT Chest WO Contrast   . CBC and differential   . Basic Metabolic Panel       Diagnostic Results     The results of the diagnostic studies below have been reviewed by myself:  Labs  Results     Procedure Component Value Units Date/Time    Basic Metabolic Panel [[id number]]  (Abnormal) Collected:  07/01/17 0838    Specimen:  Plasma Updated:  07/01/17 0925     Sodium 134 (L) mMol/L      Potassium 3.6 mMol/L      Chloride 97 (L) mMol/L      CO2 25.8 mMol/L      Calcium 9.6 mg/dL      Glucose 921 (H) mg/dL      Creatinine 1.94 mg/dL      BUN 12 mg/dL       Anion Gap 17.4 mMol/L      BUN/Creatinine Ratio 15.4 Ratio      EGFR 70 mL/min/1.79m2      Osmolality Calc 269 (L) mOsm/kg     CBC and differential [[id number]]  (Abnormal) Collected:  07/01/17 0838    Specimen:  Blood from Blood Updated:  07/01/17 0909     WBC 8.6 K/cmm      RBC 4.04 M/cmm      Hemoglobin 13.2 gm/dL      Hematocrit 63.1 %      MCV 102 (H) fL      MCH 33 pg      MCHC 32 gm/dL      RDW 49.7 (H) %      PLT CT 297 K/cmm      MPV 7.6 fL      NEUTROPHIL % 83.6 (H) %      Lymphocytes 14.4 (L) %      Monocytes 0.2 (L) %      Eosinophils %  1.4 %      Basophils % 0.4 %      Neutrophils Absolute 7.2 K/cmm      Lymphocytes Absolute 1.2 K/cmm      Monocytes Absolute 0.0 (L) K/cmm      Eosinophils Absolute 0.1 K/cmm      BASO Absolute 0.0 K/cmm           Radiologic Studies  Radiology Results (24 Hour)     Procedure Component Value Units Date/Time    CT Chest WO Contrast [[id number]] Collected:  07/01/17 1232    Order Status:  Completed Updated:  07/01/17 1248    Narrative:       Clinical History:  Evaluate chest drain placement, patient states not draining.    Examination:  CT of the chest without contrast. Multiplanar reconstructions obtained.    CT images were acquired utilizing Automated Exposure Control for dose reduction.     Comparison:  February 20, 2017 and today's chest examination    Findings:  Sections were made from apex to adrenals without contrast. A large right pleural effusion is redemonstrated. A right thoracostomy drainage tube extends into the pleural space laterally and posteriorly in the lower hemithorax. Extends posteromedially   terminating fairly posterior medially in the right hemithorax at posterior T5 level. Does not appear kinked. There is air in the pleural space on the right primarily in the anterior lower hemithorax. Slight thickening of the pleura on the right   compressive atelectasis of right middle and lower lobes. Slight septal thickening is seen in the aerated right upper lobe.  Few increased linear densities in the the lingula with slight volume loss and thickening of the left major fissure. No   consolidation or significant congestion in the left lung. No left effusion. No parenchymal mass. Visualized tracheobronchial tree is patent. Enlarging mediastinal nodes. The largest in the precarinal region measuring 15 mm short axis. No significant   hilar adenopathy. Calcified thoracic aorta without aneurysm. Heavily calcified coronary arteries. Cardiac enlargement persists predominantly involving the right atrium and right ventricle. No significant pericardial effusion. Slight increase in the lower   anterior mediastinal nodes on the right. A somewhat rounded 9.3 mm right axillary node is seen. This area was obscured by artifact related to contrast on the prior study. Proliferative changes throughout the thoracic spine. No acute bony pathology. A   small nodular density in the upper inner subareolar right breast measuring 7 mm is partially delineated on the previous study. Etiology is unclear. It can be evaluated with mammography and/or ultrasound when the patient is clinically stable.    Imaging below the diaphragm shows no definite adrenal mass.      Impression:       Cardiomegaly. Mild interstitial edema in the right lung.    A persistent large right pleural effusion with associated compressive atelectasis of right middle and lower lobes despite the presence of the posterior dependent right chest tube positioned as described above. There is also a small pneumothorax.    Persistent atelectasis or scar in the lingula.    Increasing adenopathy primarily in the mediastinum. The etiology is unclear.    Small nodule in the inner periareolar right breast as described above.    ReadingStation:[id number]    XR Chest 2 Views [[id number]] Collected:  07/01/17 8657    Order Status:  Completed Updated:  07/01/17 0929    Narrative:       INDICATION:  Has pleural drainage tube to right chest. Placed five days  ago. Decrease in drainage since last night.    EXAMINATION:   XR CHEST 2 VIEWS    COMPARISON:   Prior chest film from June 20, 2017.    TECHNIQUE: PA and lateral views.    FINDINGS:    Lines: Right-sided chest tube in place with tip projecting over the medial mid right hemithorax. Orientation of tip more is now anterior when compared to the prior chest films.    Lungs and pleura: Moderate to large right-sided pleural effusion which has increased in volume relative to recent chest films. Left lung grossly clear. Small right apical pneumothorax.    Cardiomediastinal silhouette: Cardiomegaly.    Bones and soft tissues: No acute osseous abnormality.  Prominent vascular calcification.      Impression:       1.  Moderate to large right-sided pleural effusion which is increased relative to prior films from June 20, 2017.  2.  Small right-sided apical pneumothorax.  3.  Right-sided chest tube in place.  4.  Left lung clear.    ReadingStation:[id number]          Scribe Tourist information centre manager by: Emeterio Reeve, MD   If a scribe helps to produce this document, this may include grammatical, spelling and other errors not intended by Dr. Threasa Beards who reviews this document but who may be unable to catch all the errors and inaccuracies.     Emeterio Reeve, MD  07/01/17 1458       Emeterio Reeve, MD  07/01/17 [phone]

## 2017-07-01 NOTE — Progress Notes (Signed)
IR asked to evaluate right Pleurex catheter that is not functioning.  Catheter was placed 3-21 for recurrent pleural effusion secondary to CHF.  Pt presented to ER because the tube was not draining.    CXR and CT of the chest reviewed and the tube is in appropriate position.    Tube examined by Dr. Anselm Jungling.  Tube was placed to vacuum bottle.  A small amount of debris came out into the tube and one liter of thin, amber fluid was removed without complication.    No further intervention needed at this time.  If the tube continues to clog, we may have to consider up-sizing to a larger tube.

## 2017-07-01 NOTE — ED Notes (Signed)
MD notified regarding BP of 85/66. Patient not symptomatic. MD gave RN v/o for 500cc NS bolus. Will continue to monitor pt BP.

## 2017-07-03 DIAGNOSIS — M069 Rheumatoid arthritis, unspecified: Secondary | ICD-10-CM | POA: Diagnosis not present

## 2017-07-03 DIAGNOSIS — I5020 Unspecified systolic (congestive) heart failure: Secondary | ICD-10-CM | POA: Diagnosis not present

## 2017-07-03 DIAGNOSIS — J90 Pleural effusion, not elsewhere classified: Secondary | ICD-10-CM | POA: Diagnosis not present

## 2017-07-03 DIAGNOSIS — I69320 Aphasia following cerebral infarction: Secondary | ICD-10-CM | POA: Diagnosis not present

## 2017-07-03 DIAGNOSIS — I4891 Unspecified atrial fibrillation: Secondary | ICD-10-CM | POA: Diagnosis not present

## 2017-07-03 DIAGNOSIS — F329 Major depressive disorder, single episode, unspecified: Secondary | ICD-10-CM | POA: Diagnosis not present

## 2017-07-04 DIAGNOSIS — I4891 Unspecified atrial fibrillation: Secondary | ICD-10-CM | POA: Diagnosis not present

## 2017-07-04 DIAGNOSIS — J90 Pleural effusion, not elsewhere classified: Secondary | ICD-10-CM | POA: Diagnosis not present

## 2017-07-04 DIAGNOSIS — I69320 Aphasia following cerebral infarction: Secondary | ICD-10-CM | POA: Diagnosis not present

## 2017-07-04 DIAGNOSIS — M069 Rheumatoid arthritis, unspecified: Secondary | ICD-10-CM | POA: Diagnosis not present

## 2017-07-04 DIAGNOSIS — I5020 Unspecified systolic (congestive) heart failure: Secondary | ICD-10-CM | POA: Diagnosis not present

## 2017-07-04 DIAGNOSIS — F329 Major depressive disorder, single episode, unspecified: Secondary | ICD-10-CM | POA: Diagnosis not present

## 2017-07-07 DIAGNOSIS — M069 Rheumatoid arthritis, unspecified: Secondary | ICD-10-CM | POA: Diagnosis not present

## 2017-07-07 DIAGNOSIS — I69320 Aphasia following cerebral infarction: Secondary | ICD-10-CM | POA: Diagnosis not present

## 2017-07-07 DIAGNOSIS — F329 Major depressive disorder, single episode, unspecified: Secondary | ICD-10-CM | POA: Diagnosis not present

## 2017-07-07 DIAGNOSIS — J90 Pleural effusion, not elsewhere classified: Secondary | ICD-10-CM | POA: Diagnosis not present

## 2017-07-07 DIAGNOSIS — I4891 Unspecified atrial fibrillation: Secondary | ICD-10-CM | POA: Diagnosis not present

## 2017-07-07 DIAGNOSIS — I5020 Unspecified systolic (congestive) heart failure: Secondary | ICD-10-CM | POA: Diagnosis not present

## 2017-07-08 DIAGNOSIS — I69320 Aphasia following cerebral infarction: Secondary | ICD-10-CM | POA: Diagnosis not present

## 2017-07-08 DIAGNOSIS — M069 Rheumatoid arthritis, unspecified: Secondary | ICD-10-CM | POA: Diagnosis not present

## 2017-07-08 DIAGNOSIS — J90 Pleural effusion, not elsewhere classified: Secondary | ICD-10-CM | POA: Diagnosis not present

## 2017-07-08 DIAGNOSIS — F329 Major depressive disorder, single episode, unspecified: Secondary | ICD-10-CM | POA: Diagnosis not present

## 2017-07-08 DIAGNOSIS — I4891 Unspecified atrial fibrillation: Secondary | ICD-10-CM | POA: Diagnosis not present

## 2017-07-08 DIAGNOSIS — I5020 Unspecified systolic (congestive) heart failure: Secondary | ICD-10-CM | POA: Diagnosis not present

## 2017-07-09 DIAGNOSIS — F329 Major depressive disorder, single episode, unspecified: Secondary | ICD-10-CM | POA: Diagnosis not present

## 2017-07-09 DIAGNOSIS — M069 Rheumatoid arthritis, unspecified: Secondary | ICD-10-CM | POA: Diagnosis not present

## 2017-07-09 DIAGNOSIS — J90 Pleural effusion, not elsewhere classified: Secondary | ICD-10-CM | POA: Diagnosis not present

## 2017-07-09 DIAGNOSIS — I5020 Unspecified systolic (congestive) heart failure: Secondary | ICD-10-CM | POA: Diagnosis not present

## 2017-07-09 DIAGNOSIS — I69320 Aphasia following cerebral infarction: Secondary | ICD-10-CM | POA: Diagnosis not present

## 2017-07-09 DIAGNOSIS — I4891 Unspecified atrial fibrillation: Secondary | ICD-10-CM | POA: Diagnosis not present

## 2017-07-10 DIAGNOSIS — J90 Pleural effusion, not elsewhere classified: Secondary | ICD-10-CM | POA: Diagnosis not present

## 2017-07-10 DIAGNOSIS — F329 Major depressive disorder, single episode, unspecified: Secondary | ICD-10-CM | POA: Diagnosis not present

## 2017-07-10 DIAGNOSIS — I4891 Unspecified atrial fibrillation: Secondary | ICD-10-CM | POA: Diagnosis not present

## 2017-07-10 DIAGNOSIS — M069 Rheumatoid arthritis, unspecified: Secondary | ICD-10-CM | POA: Diagnosis not present

## 2017-07-10 DIAGNOSIS — I5020 Unspecified systolic (congestive) heart failure: Secondary | ICD-10-CM | POA: Diagnosis not present

## 2017-07-10 DIAGNOSIS — I69320 Aphasia following cerebral infarction: Secondary | ICD-10-CM | POA: Diagnosis not present

## 2017-07-11 DIAGNOSIS — I5020 Unspecified systolic (congestive) heart failure: Secondary | ICD-10-CM | POA: Diagnosis not present

## 2017-07-11 DIAGNOSIS — J90 Pleural effusion, not elsewhere classified: Secondary | ICD-10-CM | POA: Diagnosis not present

## 2017-07-11 DIAGNOSIS — F329 Major depressive disorder, single episode, unspecified: Secondary | ICD-10-CM | POA: Diagnosis not present

## 2017-07-11 DIAGNOSIS — M069 Rheumatoid arthritis, unspecified: Secondary | ICD-10-CM | POA: Diagnosis not present

## 2017-07-11 DIAGNOSIS — I69320 Aphasia following cerebral infarction: Secondary | ICD-10-CM | POA: Diagnosis not present

## 2017-07-11 DIAGNOSIS — I4891 Unspecified atrial fibrillation: Secondary | ICD-10-CM | POA: Diagnosis not present

## 2017-07-14 DIAGNOSIS — I480 Paroxysmal atrial fibrillation: Secondary | ICD-10-CM | POA: Diagnosis not present

## 2017-07-14 DIAGNOSIS — M069 Rheumatoid arthritis, unspecified: Secondary | ICD-10-CM | POA: Diagnosis not present

## 2017-07-14 DIAGNOSIS — J90 Pleural effusion, not elsewhere classified: Secondary | ICD-10-CM | POA: Diagnosis not present

## 2017-07-14 DIAGNOSIS — I5020 Unspecified systolic (congestive) heart failure: Secondary | ICD-10-CM | POA: Diagnosis not present

## 2017-07-14 DIAGNOSIS — I69320 Aphasia following cerebral infarction: Secondary | ICD-10-CM | POA: Diagnosis not present

## 2017-07-14 DIAGNOSIS — Z681 Body mass index (BMI) 19 or less, adult: Secondary | ICD-10-CM | POA: Diagnosis not present

## 2017-07-14 DIAGNOSIS — Z7982 Long term (current) use of aspirin: Secondary | ICD-10-CM | POA: Diagnosis not present

## 2017-07-14 DIAGNOSIS — I4891 Unspecified atrial fibrillation: Secondary | ICD-10-CM | POA: Diagnosis not present

## 2017-07-14 DIAGNOSIS — F329 Major depressive disorder, single episode, unspecified: Secondary | ICD-10-CM | POA: Diagnosis not present

## 2017-07-15 DIAGNOSIS — F329 Major depressive disorder, single episode, unspecified: Secondary | ICD-10-CM | POA: Diagnosis not present

## 2017-07-15 DIAGNOSIS — I5020 Unspecified systolic (congestive) heart failure: Secondary | ICD-10-CM | POA: Diagnosis not present

## 2017-07-15 DIAGNOSIS — J90 Pleural effusion, not elsewhere classified: Secondary | ICD-10-CM | POA: Diagnosis not present

## 2017-07-15 DIAGNOSIS — I69320 Aphasia following cerebral infarction: Secondary | ICD-10-CM | POA: Diagnosis not present

## 2017-07-15 DIAGNOSIS — I4891 Unspecified atrial fibrillation: Secondary | ICD-10-CM | POA: Diagnosis not present

## 2017-07-15 DIAGNOSIS — M069 Rheumatoid arthritis, unspecified: Secondary | ICD-10-CM | POA: Diagnosis not present

## 2017-07-17 DIAGNOSIS — F329 Major depressive disorder, single episode, unspecified: Secondary | ICD-10-CM | POA: Diagnosis not present

## 2017-07-17 DIAGNOSIS — M069 Rheumatoid arthritis, unspecified: Secondary | ICD-10-CM | POA: Diagnosis not present

## 2017-07-17 DIAGNOSIS — I5020 Unspecified systolic (congestive) heart failure: Secondary | ICD-10-CM | POA: Diagnosis not present

## 2017-07-17 DIAGNOSIS — J90 Pleural effusion, not elsewhere classified: Secondary | ICD-10-CM | POA: Diagnosis not present

## 2017-07-17 DIAGNOSIS — I69320 Aphasia following cerebral infarction: Secondary | ICD-10-CM | POA: Diagnosis not present

## 2017-07-17 DIAGNOSIS — I4891 Unspecified atrial fibrillation: Secondary | ICD-10-CM | POA: Diagnosis not present

## 2017-07-18 DIAGNOSIS — I5020 Unspecified systolic (congestive) heart failure: Secondary | ICD-10-CM | POA: Diagnosis not present

## 2017-07-18 DIAGNOSIS — F329 Major depressive disorder, single episode, unspecified: Secondary | ICD-10-CM | POA: Diagnosis not present

## 2017-07-18 DIAGNOSIS — M069 Rheumatoid arthritis, unspecified: Secondary | ICD-10-CM | POA: Diagnosis not present

## 2017-07-18 DIAGNOSIS — I69320 Aphasia following cerebral infarction: Secondary | ICD-10-CM | POA: Diagnosis not present

## 2017-07-18 DIAGNOSIS — J90 Pleural effusion, not elsewhere classified: Secondary | ICD-10-CM | POA: Diagnosis not present

## 2017-07-18 DIAGNOSIS — I4891 Unspecified atrial fibrillation: Secondary | ICD-10-CM | POA: Diagnosis not present

## 2017-07-21 ENCOUNTER — Other Ambulatory Visit
Admission: RE | Admit: 2017-07-21 | Discharge: 2017-07-21 | Disposition: A | Discharge status: Home / Self Care | Payer: Medicare Other | Source: Ambulatory Visit | Attending: Pulmonary Disease | Admitting: Pulmonary Disease

## 2017-07-21 ENCOUNTER — Other Ambulatory Visit: Payer: Self-pay | Admitting: Pulmonary Disease

## 2017-07-21 DIAGNOSIS — F329 Major depressive disorder, single episode, unspecified: Secondary | ICD-10-CM | POA: Diagnosis not present

## 2017-07-21 DIAGNOSIS — J90 Pleural effusion, not elsewhere classified: Secondary | ICD-10-CM | POA: Diagnosis not present

## 2017-07-21 DIAGNOSIS — I5020 Unspecified systolic (congestive) heart failure: Secondary | ICD-10-CM | POA: Diagnosis not present

## 2017-07-21 DIAGNOSIS — I4891 Unspecified atrial fibrillation: Secondary | ICD-10-CM | POA: Diagnosis not present

## 2017-07-21 DIAGNOSIS — J910 Malignant pleural effusion: Secondary | ICD-10-CM | POA: Diagnosis not present

## 2017-07-21 DIAGNOSIS — C801 Malignant (primary) neoplasm, unspecified: Secondary | ICD-10-CM | POA: Diagnosis not present

## 2017-07-21 DIAGNOSIS — I69320 Aphasia following cerebral infarction: Secondary | ICD-10-CM | POA: Diagnosis not present

## 2017-07-21 DIAGNOSIS — M069 Rheumatoid arthritis, unspecified: Secondary | ICD-10-CM | POA: Diagnosis not present

## 2017-07-22 DIAGNOSIS — Z6821 Body mass index (BMI) 21.0-21.9, adult: Secondary | ICD-10-CM | POA: Diagnosis not present

## 2017-07-22 DIAGNOSIS — I480 Paroxysmal atrial fibrillation: Secondary | ICD-10-CM | POA: Diagnosis not present

## 2017-07-22 DIAGNOSIS — I4891 Unspecified atrial fibrillation: Secondary | ICD-10-CM | POA: Diagnosis not present

## 2017-07-22 DIAGNOSIS — I6930 Unspecified sequelae of cerebral infarction: Secondary | ICD-10-CM | POA: Diagnosis not present

## 2017-07-22 DIAGNOSIS — E039 Hypothyroidism, unspecified: Secondary | ICD-10-CM | POA: Diagnosis not present

## 2017-07-22 DIAGNOSIS — I509 Heart failure, unspecified: Secondary | ICD-10-CM | POA: Diagnosis not present

## 2017-07-23 DIAGNOSIS — J90 Pleural effusion, not elsewhere classified: Secondary | ICD-10-CM | POA: Diagnosis not present

## 2017-07-28 DIAGNOSIS — I5020 Unspecified systolic (congestive) heart failure: Secondary | ICD-10-CM | POA: Diagnosis not present

## 2017-07-28 DIAGNOSIS — J90 Pleural effusion, not elsewhere classified: Secondary | ICD-10-CM | POA: Diagnosis not present

## 2017-07-28 DIAGNOSIS — I69320 Aphasia following cerebral infarction: Secondary | ICD-10-CM | POA: Diagnosis not present

## 2017-07-28 DIAGNOSIS — F329 Major depressive disorder, single episode, unspecified: Secondary | ICD-10-CM | POA: Diagnosis not present

## 2017-07-28 DIAGNOSIS — I4891 Unspecified atrial fibrillation: Secondary | ICD-10-CM | POA: Diagnosis not present

## 2017-07-28 DIAGNOSIS — M069 Rheumatoid arthritis, unspecified: Secondary | ICD-10-CM | POA: Diagnosis not present

## 2017-07-30 DIAGNOSIS — Z7982 Long term (current) use of aspirin: Secondary | ICD-10-CM | POA: Diagnosis not present

## 2017-07-30 DIAGNOSIS — J90 Pleural effusion, not elsewhere classified: Secondary | ICD-10-CM | POA: Diagnosis not present

## 2017-07-30 DIAGNOSIS — Z681 Body mass index (BMI) 19 or less, adult: Secondary | ICD-10-CM | POA: Diagnosis not present

## 2017-07-30 DIAGNOSIS — I480 Paroxysmal atrial fibrillation: Secondary | ICD-10-CM | POA: Diagnosis not present

## 2017-07-31 DIAGNOSIS — M069 Rheumatoid arthritis, unspecified: Secondary | ICD-10-CM | POA: Diagnosis not present

## 2017-07-31 DIAGNOSIS — I69320 Aphasia following cerebral infarction: Secondary | ICD-10-CM | POA: Diagnosis not present

## 2017-07-31 DIAGNOSIS — F329 Major depressive disorder, single episode, unspecified: Secondary | ICD-10-CM | POA: Diagnosis not present

## 2017-07-31 DIAGNOSIS — I5020 Unspecified systolic (congestive) heart failure: Secondary | ICD-10-CM | POA: Diagnosis not present

## 2017-07-31 DIAGNOSIS — I4891 Unspecified atrial fibrillation: Secondary | ICD-10-CM | POA: Diagnosis not present

## 2017-07-31 DIAGNOSIS — J90 Pleural effusion, not elsewhere classified: Secondary | ICD-10-CM | POA: Diagnosis not present

## 2017-08-01 DIAGNOSIS — F329 Major depressive disorder, single episode, unspecified: Secondary | ICD-10-CM | POA: Diagnosis not present

## 2017-08-01 DIAGNOSIS — I5020 Unspecified systolic (congestive) heart failure: Secondary | ICD-10-CM | POA: Diagnosis not present

## 2017-08-01 DIAGNOSIS — I69320 Aphasia following cerebral infarction: Secondary | ICD-10-CM | POA: Diagnosis not present

## 2017-08-01 DIAGNOSIS — I4891 Unspecified atrial fibrillation: Secondary | ICD-10-CM | POA: Diagnosis not present

## 2017-08-01 DIAGNOSIS — M069 Rheumatoid arthritis, unspecified: Secondary | ICD-10-CM | POA: Diagnosis not present

## 2017-08-01 DIAGNOSIS — J90 Pleural effusion, not elsewhere classified: Secondary | ICD-10-CM | POA: Diagnosis not present

## 2017-08-03 DIAGNOSIS — I5022 Chronic systolic (congestive) heart failure: Secondary | ICD-10-CM | POA: Diagnosis not present

## 2017-08-03 DIAGNOSIS — I110 Hypertensive heart disease with heart failure: Secondary | ICD-10-CM | POA: Diagnosis not present

## 2017-08-03 DIAGNOSIS — Z7901 Long term (current) use of anticoagulants: Secondary | ICD-10-CM | POA: Diagnosis not present

## 2017-08-03 DIAGNOSIS — Z8673 Personal history of transient ischemic attack (TIA), and cerebral infarction without residual deficits: Secondary | ICD-10-CM | POA: Diagnosis not present

## 2017-08-03 DIAGNOSIS — E559 Vitamin D deficiency, unspecified: Secondary | ICD-10-CM | POA: Diagnosis not present

## 2017-08-03 DIAGNOSIS — J918 Pleural effusion in other conditions classified elsewhere: Secondary | ICD-10-CM | POA: Diagnosis not present

## 2017-08-03 DIAGNOSIS — M069 Rheumatoid arthritis, unspecified: Secondary | ICD-10-CM | POA: Diagnosis not present

## 2017-08-03 DIAGNOSIS — Z4682 Encounter for fitting and adjustment of non-vascular catheter: Secondary | ICD-10-CM | POA: Diagnosis not present

## 2017-08-03 DIAGNOSIS — F329 Major depressive disorder, single episode, unspecified: Secondary | ICD-10-CM | POA: Diagnosis not present

## 2017-08-03 DIAGNOSIS — I480 Paroxysmal atrial fibrillation: Secondary | ICD-10-CM | POA: Diagnosis not present

## 2017-08-04 ENCOUNTER — Other Ambulatory Visit: Payer: Self-pay | Admitting: Pulmonary Disease

## 2017-08-04 ENCOUNTER — Other Ambulatory Visit
Admission: RE | Admit: 2017-08-04 | Discharge: 2017-08-04 | Disposition: A | Discharge status: Home / Self Care | Payer: Medicare Other | Source: Ambulatory Visit | Attending: Pulmonary Disease | Admitting: Pulmonary Disease

## 2017-08-04 DIAGNOSIS — C801 Malignant (primary) neoplasm, unspecified: Secondary | ICD-10-CM | POA: Diagnosis not present

## 2017-08-04 DIAGNOSIS — M069 Rheumatoid arthritis, unspecified: Secondary | ICD-10-CM | POA: Diagnosis not present

## 2017-08-04 DIAGNOSIS — Z4682 Encounter for fitting and adjustment of non-vascular catheter: Secondary | ICD-10-CM | POA: Diagnosis not present

## 2017-08-04 DIAGNOSIS — I480 Paroxysmal atrial fibrillation: Secondary | ICD-10-CM | POA: Diagnosis not present

## 2017-08-04 DIAGNOSIS — J918 Pleural effusion in other conditions classified elsewhere: Secondary | ICD-10-CM | POA: Diagnosis not present

## 2017-08-04 DIAGNOSIS — J910 Malignant pleural effusion: Secondary | ICD-10-CM | POA: Diagnosis not present

## 2017-08-04 DIAGNOSIS — I110 Hypertensive heart disease with heart failure: Secondary | ICD-10-CM | POA: Diagnosis not present

## 2017-08-04 DIAGNOSIS — I5022 Chronic systolic (congestive) heart failure: Secondary | ICD-10-CM | POA: Diagnosis not present

## 2017-08-04 DIAGNOSIS — J90 Pleural effusion, not elsewhere classified: Secondary | ICD-10-CM

## 2017-08-05 DIAGNOSIS — J90 Pleural effusion, not elsewhere classified: Secondary | ICD-10-CM | POA: Diagnosis not present

## 2017-08-06 DIAGNOSIS — H348112 Central retinal vein occlusion, right eye, stable: Secondary | ICD-10-CM | POA: Diagnosis not present

## 2017-08-06 DIAGNOSIS — H2512 Age-related nuclear cataract, left eye: Secondary | ICD-10-CM | POA: Diagnosis not present

## 2017-08-06 DIAGNOSIS — H26491 Other secondary cataract, right eye: Secondary | ICD-10-CM | POA: Diagnosis not present

## 2017-08-06 DIAGNOSIS — H401132 Primary open-angle glaucoma, bilateral, moderate stage: Secondary | ICD-10-CM | POA: Diagnosis not present

## 2017-08-08 DIAGNOSIS — C801 Malignant (primary) neoplasm, unspecified: Secondary | ICD-10-CM | POA: Diagnosis not present

## 2017-08-08 DIAGNOSIS — R971 Elevated cancer antigen 125 [CA 125]: Secondary | ICD-10-CM | POA: Diagnosis not present

## 2017-08-08 DIAGNOSIS — J910 Malignant pleural effusion: Secondary | ICD-10-CM | POA: Diagnosis not present

## 2017-08-08 DIAGNOSIS — Z6821 Body mass index (BMI) 21.0-21.9, adult: Secondary | ICD-10-CM | POA: Diagnosis not present

## 2017-08-12 ENCOUNTER — Encounter: Payer: Self-pay | Admitting: Hematology & Oncology

## 2017-08-12 DIAGNOSIS — C801 Malignant (primary) neoplasm, unspecified: Secondary | ICD-10-CM

## 2017-08-12 DIAGNOSIS — R971 Elevated cancer antigen 125 [CA 125]: Secondary | ICD-10-CM

## 2017-08-12 DIAGNOSIS — J910 Malignant pleural effusion: Secondary | ICD-10-CM

## 2017-08-12 DIAGNOSIS — C569 Malignant neoplasm of unspecified ovary: Secondary | ICD-10-CM

## 2017-08-12 DIAGNOSIS — R9389 Abnormal findings on diagnostic imaging of other specified body structures: Secondary | ICD-10-CM

## 2017-08-12 DIAGNOSIS — I420 Dilated cardiomyopathy: Secondary | ICD-10-CM

## 2017-08-13 ENCOUNTER — Encounter (INDEPENDENT_AMBULATORY_CARE_PROVIDER_SITE_OTHER): Payer: Self-pay

## 2017-08-13 ENCOUNTER — Ambulatory Visit (INDEPENDENT_AMBULATORY_CARE_PROVIDER_SITE_OTHER)
Admission: RE | Admit: 2017-08-13 | Discharge: 2017-08-13 | Disposition: A | Discharge status: Home / Self Care | Payer: Medicare Other | Source: Ambulatory Visit | Attending: Hematology & Oncology | Admitting: Hematology & Oncology

## 2017-08-13 DIAGNOSIS — R918 Other nonspecific abnormal finding of lung field: Secondary | ICD-10-CM | POA: Diagnosis not present

## 2017-08-13 DIAGNOSIS — R1909 Other intra-abdominal and pelvic swelling, mass and lump: Secondary | ICD-10-CM | POA: Diagnosis not present

## 2017-08-13 DIAGNOSIS — R195 Other fecal abnormalities: Secondary | ICD-10-CM | POA: Diagnosis not present

## 2017-08-13 DIAGNOSIS — C3411 Malignant neoplasm of upper lobe, right bronchus or lung: Secondary | ICD-10-CM | POA: Diagnosis not present

## 2017-08-13 DIAGNOSIS — J90 Pleural effusion, not elsewhere classified: Secondary | ICD-10-CM | POA: Diagnosis not present

## 2017-08-13 DIAGNOSIS — R590 Localized enlarged lymph nodes: Secondary | ICD-10-CM | POA: Diagnosis not present

## 2017-08-13 DIAGNOSIS — C801 Malignant (primary) neoplasm, unspecified: Secondary | ICD-10-CM

## 2017-08-13 DIAGNOSIS — C569 Malignant neoplasm of unspecified ovary: Secondary | ICD-10-CM

## 2017-08-13 DIAGNOSIS — R9389 Abnormal findings on diagnostic imaging of other specified body structures: Secondary | ICD-10-CM

## 2017-08-13 LAB — I-STAT CREATININE
Creatinine I-Stat: 0.8 mg/dL (ref 0.60–1.20)
EGFR: 67 mL/min/{1.73_m2} (ref 60–150)

## 2017-08-13 MED ORDER — IODIXANOL 320 MG/ML IV SOLN
90.00 mL | Freq: Once | INTRAVENOUS | Status: AC | PRN
Start: 2017-08-13 — End: 2017-08-13
  Administered 2017-08-13: 16:00:00 90 mL via INTRAVENOUS

## 2017-08-14 DIAGNOSIS — J918 Pleural effusion in other conditions classified elsewhere: Secondary | ICD-10-CM | POA: Diagnosis not present

## 2017-08-14 DIAGNOSIS — I110 Hypertensive heart disease with heart failure: Secondary | ICD-10-CM | POA: Diagnosis not present

## 2017-08-14 DIAGNOSIS — I480 Paroxysmal atrial fibrillation: Secondary | ICD-10-CM | POA: Diagnosis not present

## 2017-08-14 DIAGNOSIS — Z4682 Encounter for fitting and adjustment of non-vascular catheter: Secondary | ICD-10-CM | POA: Diagnosis not present

## 2017-08-14 DIAGNOSIS — M069 Rheumatoid arthritis, unspecified: Secondary | ICD-10-CM | POA: Diagnosis not present

## 2017-08-14 DIAGNOSIS — I5022 Chronic systolic (congestive) heart failure: Secondary | ICD-10-CM | POA: Diagnosis not present

## 2017-08-16 ENCOUNTER — Inpatient Hospital Stay
Admission: EM | Admit: 2017-08-16 | Discharge: 2017-08-18 | DRG: 180 | Disposition: A | Discharge status: Home Health | Payer: Medicare Other | Attending: Internal Medicine | Admitting: Internal Medicine

## 2017-08-16 ENCOUNTER — Emergency Department: Payer: Medicare Other

## 2017-08-16 DIAGNOSIS — Z66 Do not resuscitate: Secondary | ICD-10-CM | POA: Diagnosis present

## 2017-08-16 DIAGNOSIS — Z8673 Personal history of transient ischemic attack (TIA), and cerebral infarction without residual deficits: Secondary | ICD-10-CM | POA: Diagnosis not present

## 2017-08-16 DIAGNOSIS — Z91018 Allergy to other foods: Secondary | ICD-10-CM | POA: Diagnosis not present

## 2017-08-16 DIAGNOSIS — R4701 Aphasia: Secondary | ICD-10-CM | POA: Diagnosis not present

## 2017-08-16 DIAGNOSIS — E43 Unspecified severe protein-calorie malnutrition: Secondary | ICD-10-CM | POA: Diagnosis present

## 2017-08-16 DIAGNOSIS — C55 Malignant neoplasm of uterus, part unspecified: Secondary | ICD-10-CM | POA: Diagnosis not present

## 2017-08-16 DIAGNOSIS — I4891 Unspecified atrial fibrillation: Secondary | ICD-10-CM | POA: Diagnosis not present

## 2017-08-16 DIAGNOSIS — C801 Malignant (primary) neoplasm, unspecified: Secondary | ICD-10-CM | POA: Diagnosis not present

## 2017-08-16 DIAGNOSIS — Z4682 Encounter for fitting and adjustment of non-vascular catheter: Secondary | ICD-10-CM | POA: Diagnosis not present

## 2017-08-16 DIAGNOSIS — J984 Other disorders of lung: Secondary | ICD-10-CM | POA: Diagnosis not present

## 2017-08-16 DIAGNOSIS — D390 Neoplasm of uncertain behavior of uterus: Secondary | ICD-10-CM | POA: Diagnosis not present

## 2017-08-16 DIAGNOSIS — Z85820 Personal history of malignant melanoma of skin: Secondary | ICD-10-CM | POA: Diagnosis not present

## 2017-08-16 DIAGNOSIS — R0689 Other abnormalities of breathing: Secondary | ICD-10-CM | POA: Diagnosis present

## 2017-08-16 DIAGNOSIS — C3400 Malignant neoplasm of unspecified main bronchus: Secondary | ICD-10-CM | POA: Diagnosis not present

## 2017-08-16 DIAGNOSIS — G4700 Insomnia, unspecified: Secondary | ICD-10-CM | POA: Diagnosis present

## 2017-08-16 DIAGNOSIS — E785 Hyperlipidemia, unspecified: Secondary | ICD-10-CM | POA: Diagnosis not present

## 2017-08-16 DIAGNOSIS — Z6821 Body mass index (BMI) 21.0-21.9, adult: Secondary | ICD-10-CM | POA: Diagnosis not present

## 2017-08-16 DIAGNOSIS — R0600 Dyspnea, unspecified: Secondary | ICD-10-CM | POA: Diagnosis not present

## 2017-08-16 DIAGNOSIS — E039 Hypothyroidism, unspecified: Secondary | ICD-10-CM | POA: Diagnosis present

## 2017-08-16 DIAGNOSIS — Z978 Presence of other specified devices: Secondary | ICD-10-CM | POA: Diagnosis not present

## 2017-08-16 DIAGNOSIS — J910 Malignant pleural effusion: Secondary | ICD-10-CM | POA: Diagnosis not present

## 2017-08-16 DIAGNOSIS — I5022 Chronic systolic (congestive) heart failure: Secondary | ICD-10-CM | POA: Diagnosis present

## 2017-08-16 DIAGNOSIS — Z7901 Long term (current) use of anticoagulants: Secondary | ICD-10-CM | POA: Diagnosis not present

## 2017-08-16 DIAGNOSIS — R0602 Shortness of breath: Secondary | ICD-10-CM | POA: Diagnosis not present

## 2017-08-16 DIAGNOSIS — I110 Hypertensive heart disease with heart failure: Secondary | ICD-10-CM | POA: Diagnosis present

## 2017-08-16 DIAGNOSIS — I482 Chronic atrial fibrillation: Secondary | ICD-10-CM | POA: Diagnosis present

## 2017-08-16 DIAGNOSIS — M069 Rheumatoid arthritis, unspecified: Secondary | ICD-10-CM | POA: Diagnosis present

## 2017-08-16 DIAGNOSIS — E876 Hypokalemia: Secondary | ICD-10-CM | POA: Diagnosis present

## 2017-08-16 DIAGNOSIS — J949 Pleural condition, unspecified: Secondary | ICD-10-CM | POA: Diagnosis not present

## 2017-08-16 DIAGNOSIS — J90 Pleural effusion, not elsewhere classified: Secondary | ICD-10-CM | POA: Diagnosis not present

## 2017-08-16 DIAGNOSIS — I10 Essential (primary) hypertension: Secondary | ICD-10-CM | POA: Diagnosis not present

## 2017-08-16 DIAGNOSIS — Z7951 Long term (current) use of inhaled steroids: Secondary | ICD-10-CM | POA: Diagnosis not present

## 2017-08-16 DIAGNOSIS — D381 Neoplasm of uncertain behavior of trachea, bronchus and lung: Secondary | ICD-10-CM | POA: Diagnosis not present

## 2017-08-16 DIAGNOSIS — Z888 Allergy status to other drugs, medicaments and biological substances status: Secondary | ICD-10-CM | POA: Diagnosis not present

## 2017-08-16 DIAGNOSIS — Z79890 Hormone replacement therapy (postmenopausal): Secondary | ICD-10-CM | POA: Diagnosis not present

## 2017-08-16 DIAGNOSIS — J9620 Acute and chronic respiratory failure, unspecified whether with hypoxia or hypercapnia: Secondary | ICD-10-CM | POA: Diagnosis not present

## 2017-08-16 LAB — I-STAT CHEM 8 CARTRIDGE
Anion Gap I-Stat: 15 (ref 7.0–16.0)
BUN I-Stat: 17 mg/dL (ref 7–22)
Calcium Ionized I-Stat: 4.3 mg/dL — ABNORMAL LOW (ref 4.35–5.10)
Chloride I-Stat: 90 mMol/L — ABNORMAL LOW (ref 98–110)
Creatinine I-Stat: 0.9 mg/dL (ref 0.60–1.20)
EGFR: 58 mL/min/{1.73_m2} — ABNORMAL LOW (ref 60–150)
Glucose I-Stat: 109 mg/dL — ABNORMAL HIGH (ref 71–99)
Hematocrit I-Stat: 43 % (ref 36.0–48.0)
Hemoglobin I-Stat: 14.6 gm/dL (ref 12.0–16.0)
Potassium I-Stat: 3.2 mMol/L — ABNORMAL LOW (ref 3.5–5.3)
Sodium I-Stat: 134 mMol/L — ABNORMAL LOW (ref 136–147)
TCO2 I-Stat: 33 mMol/L — ABNORMAL HIGH (ref 24–29)

## 2017-08-16 LAB — CBC AND DIFFERENTIAL
Basophils %: 0.6 % (ref 0.0–3.0)
Basophils Absolute: 0.1 10*3/uL (ref 0.0–0.3)
Eosinophils %: 0.5 % (ref 0.0–7.0)
Eosinophils Absolute: 0 10*3/uL (ref 0.0–0.8)
Hematocrit: 39.9 % (ref 36.0–48.0)
Hemoglobin: 13.2 gm/dL (ref 12.0–16.0)
Lymphocytes Absolute: 1.1 10*3/uL (ref 0.6–5.1)
Lymphocytes: 11.5 % — ABNORMAL LOW (ref 15.0–46.0)
MCH: 34 pg (ref 28–35)
MCHC: 33 gm/dL (ref 32–36)
MCV: 102 fL — ABNORMAL HIGH (ref 80–100)
MPV: 7.1 fL (ref 6.0–10.0)
Monocytes Absolute: 0.7 10*3/uL (ref 0.1–1.7)
Monocytes: 6.8 % (ref 3.0–15.0)
Neutrophils %: 80.6 % — ABNORMAL HIGH (ref 42.0–78.0)
Neutrophils Absolute: 7.9 10*3/uL (ref 1.7–8.6)
PLT CT: 404 10*3/uL (ref 130–440)
RBC: 3.93 10*6/uL (ref 3.80–5.00)
RDW: 17.3 % — ABNORMAL HIGH (ref 11.0–14.0)
WBC: 9.8 10*3/uL (ref 4.0–11.0)

## 2017-08-16 LAB — VH I-STAT CHEM 8 NOTIFICATION

## 2017-08-16 MED ORDER — FUROSEMIDE 40 MG PO TABS
40.00 mg | ORAL_TABLET | Freq: Every day | ORAL | Status: DC
Start: 2017-08-16 — End: 2017-08-16
  Filled 2017-08-16: qty 1

## 2017-08-16 MED ORDER — DILTIAZEM HCL ER COATED BEADS 300 MG PO CP24
300.00 mg | ORAL_CAPSULE | Freq: Every day | ORAL | Status: DC
Start: 2017-08-16 — End: 2017-08-19
  Administered 2017-08-17 – 2017-08-18 (×2): 300 mg via ORAL
  Filled 2017-08-16 (×4): qty 1

## 2017-08-16 MED ORDER — ONDANSETRON HCL 4 MG/2ML IJ SOLN
4.00 mg | Freq: Three times a day (TID) | INTRAMUSCULAR | Status: DC | PRN
Start: 2017-08-16 — End: 2017-08-19

## 2017-08-16 MED ORDER — LACTATED RINGERS IV SOLN
INTRAVENOUS | Status: DC
Start: 2017-08-16 — End: 2017-08-16

## 2017-08-16 MED ORDER — DILTIAZEM HCL ER BEADS 300 MG PO CP24
300.00 mg | ORAL_CAPSULE | Freq: Every day | ORAL | Status: DC
Start: 2017-08-16 — End: 2017-08-16

## 2017-08-16 MED ORDER — VH POTASSIUM CHLORIDE CRYS ER 20 MEQ PO TBCR (WRAP)
40.00 meq | EXTENDED_RELEASE_TABLET | Freq: Once | ORAL | Status: AC
Start: 2017-08-16 — End: 2017-08-16
  Administered 2017-08-16: 18:00:00 40 meq via ORAL
  Filled 2017-08-16: qty 2

## 2017-08-16 MED ORDER — MIRTAZAPINE 15 MG PO TABS
15.00 mg | ORAL_TABLET | Freq: Every evening | ORAL | Status: DC
Start: 2017-08-16 — End: 2017-08-19
  Administered 2017-08-16 – 2017-08-17 (×2): 15 mg via ORAL
  Filled 2017-08-16 (×4): qty 1

## 2017-08-16 MED ORDER — FOLIC ACID 1 MG PO TABS
1.00 mg | ORAL_TABLET | Freq: Every day | ORAL | Status: DC
Start: 2017-08-16 — End: 2017-08-19
  Administered 2017-08-17 – 2017-08-18 (×2): 1 mg via ORAL
  Filled 2017-08-16 (×4): qty 1

## 2017-08-16 MED ORDER — APIXABAN 2.5 MG PO TABS
2.50 mg | ORAL_TABLET | Freq: Two times a day (BID) | ORAL | Status: DC
Start: 2017-08-16 — End: 2017-08-19
  Administered 2017-08-16 – 2017-08-17 (×3): 2.5 mg via ORAL
  Filled 2017-08-16 (×8): qty 1

## 2017-08-16 MED ORDER — ONDANSETRON 4 MG PO TBDP
4.00 mg | ORAL_TABLET | Freq: Three times a day (TID) | ORAL | Status: DC | PRN
Start: 2017-08-16 — End: 2017-08-19

## 2017-08-16 MED ORDER — LATANOPROST 0.005 % OP SOLN
1.00 [drp] | Freq: Every evening | OPHTHALMIC | Status: DC
Start: 2017-08-16 — End: 2017-08-19
  Administered 2017-08-16 – 2017-08-17 (×2): 1 [drp] via OPHTHALMIC
  Filled 2017-08-16: qty 1

## 2017-08-16 MED ORDER — METHOTREXATE SODIUM 2.5 MG PO TABS
10.00 mg | ORAL_TABLET | ORAL | Status: DC
Start: 2017-08-18 — End: 2017-08-16

## 2017-08-16 MED ORDER — FUROSEMIDE 10 MG/ML IJ SOLN
20.00 mg | Freq: Two times a day (BID) | INTRAMUSCULAR | Status: DC
Start: 2017-08-16 — End: 2017-08-19
  Administered 2017-08-16 – 2017-08-18 (×4): 20 mg via INTRAVENOUS
  Filled 2017-08-16 (×7): qty 2

## 2017-08-16 MED ORDER — SODIUM CHLORIDE 0.9 % IJ SOLN
3.00 mL | Freq: Three times a day (TID) | INTRAMUSCULAR | Status: DC
Start: 2017-08-16 — End: 2017-08-19
  Administered 2017-08-16 – 2017-08-18 (×6): 3 mL via INTRAVENOUS

## 2017-08-16 MED ORDER — SODIUM CHLORIDE 0.9 % IJ SOLN
0.40 mg | INTRAMUSCULAR | Status: DC | PRN
Start: 2017-08-16 — End: 2017-08-19

## 2017-08-16 MED ORDER — LEVOTHYROXINE SODIUM 125 MCG PO TABS
125.00 ug | ORAL_TABLET | Freq: Every day | ORAL | Status: DC
Start: 2017-08-16 — End: 2017-08-19
  Administered 2017-08-17 – 2017-08-18 (×2): 125 ug via ORAL
  Filled 2017-08-16 (×5): qty 1

## 2017-08-16 MED ORDER — ALBUTEROL SULFATE HFA 108 (90 BASE) MCG/ACT IN AERS
2.00 | INHALATION_SPRAY | Freq: Four times a day (QID) | RESPIRATORY_TRACT | Status: DC | PRN
Start: 2017-08-16 — End: 2017-08-19

## 2017-08-16 NOTE — ED Notes (Signed)
Pharmacy at bedside

## 2017-08-16 NOTE — ED Notes (Signed)
Attempt for INT in lac unsuccessful due to valve. Labs obtained and sent. INT to be placed by RN Jody, at bedside at this time.

## 2017-08-16 NOTE — ACP (Advance Care Planning) (Signed)
Advance Care Planning Services    Patient Name: Kristina [name], Kristina [name] LOS: 0 days   Attending Physician: Astrid Drafts, MD   Primary Care Physician: Ellender Hose, MD   Narrative of Meeting   Events prompting this discussion:  During hospitalization  Acute on chronic respiratory failure  Recurrent malignant pleural effusion  Newly diagnosed necrotic uterine tumor  Right lung mass       Major disease and the trajectory:  Negative for malignancy recurrent effusion  Congestive heart failure  Chronic atrial fibrillation     The types of care discussed:  Standard of care for disease, Resuscitation with CPR, Feeding Tubes (temporary or permanent), Intubation/TracheostomyTube, Chemotherapy, Surgery and Limiting future hospitalization     The Patient's/Surrogate's wishes and goals for treatment:  Patient states she does not want to be resuscitated with CPR OR  ventilator machine,   she may not even go for chemotherapy on any surgery if recommended after discussions with oncologist .  We discussed with the patient's medical power of attorney her son Alinda Money regarding the findings of the imaging studies-suggested that his mom would prefer to have palliative care and comfort measures.   Active Problem List  Active Problems:    Dyspnea        Documents Filled Out As Part of Discussion:                None  Existing Documents that were reviewed and discussed with the patient/surrogate:  None   Acknowledgment of patient's right to decline advance care planning:  The patient has medical decision-making capacity and was represented by herself and her daughter Tobi Bastos.  The patient/surrogate was informed that this session is completely voluntary and was given the opportunity to decline the ACP services. The patient/surrogate decided to participate in the ACP session with the discussion proceeding as described above.   I have spent 20 minutes on face-to-face Advance Care Planning Services   100% of the time was spent on  discussion and counseling the patient, daughter and son.   No active management of the problems listed above was undertaken during the time period reported.       Medical decision-making capacity can be determined by patient's ability to have:  1.     Factual understanding of current situation and issues;   2.     Interpretation of the information presented.   3.     Reasoning ability about decisions/choices.  4.     Execution of voluntary decision without coercion.   Advance care planning:             Improves end of life care            Improves patient & family satisfaction            Reduces stress, anxiety, & depression in surviving relatives  Reference:  Detering et al., BMJ 2010; 340 doi: PopPath.it (Published 08 June 2008)   Astrid Drafts, MD  08/16/17 5:12 PM  MRN: [id number]                                       CSN: [id number]                                             DOB: December 12, 1929

## 2017-08-16 NOTE — EDIE (Signed)
COLLECTIVE?NOTIFICATION?08/16/2017 07:50?NEREYDA, BOWLER R?MRN: [id number]    Criteria Met      High Utilization (6+ED/6 Months)    Security and Safety  No recent Security Events currently on file    ED Care Guidelines  There are currently no ED Care Guidelines for this patient. Please check your facility's medical records system.      Prescription Monitoring Program  200??- Narcotic Use Score  080??- Sedative Use Score  000??- Stimulant Use Score  300??- Overdose Risk Score  - All Scores range from 000-999 with 75% of the population scoring < 200 and on 1% scoring above 650  - The last digit of the narcotic, sedative, and stimulant score indicates the number of active prescriptions of that type  - Higher Use scores correlate with increased prescribers, pharmacies, mg equiv, and overlapping prescriptions  - Higher Overdose Risk Scores correlate with increased risk of unintentional overdose death   Concerning or unexpectedly high scores should prompt a review of the PMP record; this does not constitute checking PMP for prescribing purposes.      E.D. Visit Count (12 mo.)  Facility Visits   [facility] 6   Total 6   Note: Visits indicate total known visits.      Recent Emergency Department Visit Summary  Date Facility [facility] Type Diagnoses or Chief Complaint   Aug 16, 2017 [facility]. [place] [facility]      CAN'T BREATHE      Jul 01, 2017 [facility] [place] [place] [facility]      chest pain      Drainage tube problem      Unspecified complication of internal prosthetic device, implant and graft, initial encounter      Jun 17, 2017 [facility]. [place] [place] [facility]      fever      Fever, unspecified      Mar 19, 2017 [facility]. [place] [place] [facility]      Chest Pain      Pneumonia, unspecified organism      Feb 24, 2017 [facility] [place] [place] [facility]      Diarrhea      Phonological disorder      Nontraumatic subarachnoid hemorrhage,  unspecified      Noninfective gastroenteritis and colitis, unspecified      Cerebral infarction, unspecified      Essential (primary) hypertension      Feb 20, 2017 [facility] [place] [place] [facility]      abd pain, bloody stools      Abdominal Pain      Gastrointestinal hemorrhage, unspecified          Recent Inpatient Visit Summary  Date Facility [facility] Type Diagnoses or Chief Complaint   Jun 17, 2017 [facility]. [place] [place] [facility]      Fever, unspecified      Mar 19, 2017 [facility] [place] [place] [facility]      Pneumonia, unspecified organism      Feb 24, 2017 [facility]. [place] [place] [facility]      Phonological disorder      Cerebral infarction, unspecified      Noninfective gastroenteritis and colitis, unspecified      Essential (primary) hypertension      Feb 20, 2017 [facility]. [place]      Gastrointestinal hemorrhage, unspecified  Pleural effusion, not elsewhere classified          Care Providers  Provider [facility] Type Phone Fax Service Dates   Laqueta Due [facility] Primary Care   Current      Collective Portal  This patient has registered at the [facility] Emergency Department   For more information visit: [email or web address]     PLEASE NOTE:    1.   Any care recommendations and other clinical information are provided as guidelines or for historical purposes only, and providers should exercise their own clinical judgment when providing care.    2.   You may only use this information for purposes of treatment, payment or health care operations activities, and subject to the limitations of applicable Collective Policies.    3.   You should consult directly with the organization that provided a care guideline or other clinical history with any questions about additional information or accuracy or completeness of information  provided.    ? 2019 [place]. - [email or web address]

## 2017-08-16 NOTE — ED Provider Notes (Signed)
EMERGENCY MEDICINE HISTORY AND PHYSICAL         Patient: Kristina [name]  Encounter Date: 08/16/2017    DOB: 1930-01-15  Age/Sex: 84 y.o. female    MRN: [id number]  Room: 273/273-A    PCP: Ellender Hose, MD  Attending: Marcellus Scott, MD         Historian: Patient      History of Presenting Illness     Chief complaint: shortness of breath       Kristina [name] is a 84 y.o. female who presents with shortness of breath. The patient has a pleural drainage tube to the right chest. She states that she has had decreased drainage for the last 2 weeks and none yesterday. She denies fever, cough, or recent illness. No home O2. She does notice an increase in dyspnea.   History of a-fib, CHF, and bradycardia. Recent diagnosis of uterine CA.        Review of Systems     Review of Systems   Constitutional: Negative for chills and fever.   HENT: Negative for congestion, ear discharge and sore throat.    Eyes: Negative for discharge.   Respiratory: Positive for shortness of breath. Negative for cough, sputum production and wheezing.    Cardiovascular: Negative for chest pain, palpitations and leg swelling.   Gastrointestinal: Negative for abdominal pain, diarrhea, nausea and vomiting.   Genitourinary: Negative for dysuria and frequency.   Musculoskeletal: Negative for joint pain.   Skin: Negative for rash.   Neurological: Negative for dizziness and headaches.   Endo/Heme/Allergies: Does not bruise/bleed easily.                Past Medical History:     Past Medical History:   Diagnosis Date   . A-fib    . Aphasia    . Arrhythmia     afib   . Bradycardia    . Cerebral infarction    . Chest pain    . Congestive heart failure    . Diarrhea    . Disorder of thyroid     hypo   . Hyperlipidemia    . Hypertension    . Melanoma    . Rheumatoid arthritis    . Vitamin D deficiency                 Past Surgical History:     Past Surgical History:   Procedure Laterality Date   . arm surgery      roght   . HIP SURGERY      left                 Family History:     No family history on file.             Social History:     Social History     Social History   . Marital status: Widowed     Spouse name: N/A   . Number of children: N/A   . Years of education: N/A     Social History Main Topics   . Smoking status: Never Smoker   . Smokeless tobacco: Never Used   . Alcohol use Yes      Comment: rarely glass of wine   . Drug use: No   . Sexual activity: Not on file     Other Topics Concern   . Not on file     Social History Narrative   . No narrative on  file                Allergies:     Allergies   Allergen Reactions   . Lisinopril Anaphylaxis   . Gluten Meal                 Medications:     No current facility-administered medications for this encounter.      Current Outpatient Prescriptions   Medication Sig Dispense Refill   . apixaban (ELIQUIS) 2.5 MG Take 2.5 mg by mouth every 12 (twelve) hours     . bimatoprost (LUMIGAN) 0.03 % ophthalmic drops Place 1 drop into both eyes nightly     . dilTIAZem (TIAZAC) 300 MG 24 hr capsule Take 300 mg by mouth daily     . fluticasone (FLONASE) 50 MCG/ACT nasal spray 1 spray by Nasal route daily as needed for Rhinitis or Allergies.         . folic acid (FOLVITE) 1 MG tablet Take 1 mg by mouth daily.     . furosemide (LASIX) 40 MG tablet Take 1 tablet (40 mg total) by mouth daily.     Marland Kitchen lactase (LACTAID) 3000 units tablet Take 1 tablet by mouth 3 (three) times daily with meals as needed         . levothyroxine (SYNTHROID, LEVOTHROID) 125 MCG tablet Take 125 mcg by mouth Once a day at 6:00am     . methotrexate 2.5 MG tablet Take 10 mg by mouth once a week Weekly on Mondays        . mirtazapine (REMERON) 15 MG tablet Take 15 mg by mouth nightly     . PROAIR HFA 108 (90 Base) MCG/ACT inhaler Inhale 2 puffs into the lungs 2 (two) times daily as needed for Wheezing or Shortness of Breath.         Marland Kitchen acetaminophen (TYLENOL) 325 MG tablet Take 650 mg by mouth every 4 (four) hours as needed for Pain.     . nitroglycerin  (NITROSTAT) 0.4 MG SL tablet Place 0.4 mg under the tongue every 5 (five) minutes as needed for Chest pain.                      Physical Exam:     Vitals:    08/18/17 1010   BP: 114/74   Pulse: 95   Resp:    Temp:    SpO2:         Patient is Alert and oriented x3. No Acute Distress.  HEENT: EOMI. Sclera non-icteric. Conjunctiva pink.  Pharnyx: Moist. No lesions.  Neck: Supple. No significant adenopathy.  Chest: Decreased breath sounds on the left. Rhonchi on the right.  Heart: Regular rhythm. No murmur. No rub. No gallop.  Abdomen: Soft, non-tender. Non-distended. No hepatomegaly. No RLQ tenderness.    Back: Non-tender; no lesions  Rectal: deferred  GU: deferred  Extremities: Good pulses distally. No Edema.   Skin: No lesions  Neurology: No gross focal deficits             LABORATORY EVALUATIONS     Labs Reviewed   CBC AND DIFFERENTIAL - Abnormal; Notable for the following:        Result Value    MCV 102 (*)     RDW 17.3 (*)     NEUTROPHIL % 80.6 (*)     Lymphocytes 11.5 (*)     All other components within normal limits   CBC AND DIFFERENTIAL - Abnormal; Notable  for the following:     RBC 3.29 (*)     Hemoglobin 10.8 (*)     Hematocrit 33.8 (*)     MCV 103 (*)     RDW 17.4 (*)     NEUTROPHIL % 83.0 (*)     Lymphocytes 5.0 (*)     Lymphocytes Absolute 0.4 (*)     All other components within normal limits    Narrative:     Manual differential performed   COMPREHENSIVE METABOLIC PANEL - Abnormal; Notable for the following:     Potassium 3.4 (*)     Chloride 95 (*)     CO2 31.0 (*)     Calcium 8.1 (*)     Protein, Total 5.2 (*)     Albumin 2.0 (*)     Albumin/Globulin Ratio 0.63 (*)     Osmolality Calc 271 (*)     All other components within normal limits   CBC AND DIFFERENTIAL - Abnormal; Notable for the following:     RBC 3.36 (*)     Hemoglobin 10.9 (*)     Hematocrit 34.7 (*)     MCV 103 (*)     MCHC 31 (*)     RDW 17.5 (*)     Lymphocytes 12.4 (*)     All other components within normal limits   BASIC METABOLIC  PANEL - Abnormal; Notable for the following:     Sodium 135 (*)     Chloride 96 (*)     CO2 32.5 (*)     Calcium 7.8 (*)     Osmolality Calc 269 (*)     All other components within normal limits   PT/INR - Abnormal; Notable for the following:     PT 11.6 (*)     All other components within normal limits   I-STAT CHEM 8 CARTRIDGE - Abnormal; Notable for the following:     i-STAT Sodium 134 (*)     i-STAT Potassium 3.2 (*)     i-STAT Chloride 90 (*)     TCO2, ISTAT 33 (*)     Ionized Ca, ISTAT 4.30 (*)     i-STAT Glucose 109 (*)     EGFR 58 (*)     All other components within normal limits   VH I-STAT CHEM 8 NOTIFICATION                EKG:     Last EKG Result     Procedure Component Value Units Date/Time    ECG 12 lead (Stat) [[id number]] Collected:  08/16/17 0825     Updated:  08/16/17 0826     Patient Age 39 years      Patient DOB 1929/07/19     Patient Height --     Patient Weight --     Interpretation Text --     Atrial fibrillation  Low voltage, extremity and precordial leads  Anteroseptal infarct, old  Nonspecific T abnormalities, lateral leads  Compared to ECG 06/17/2017 13:09:01  Myocardial infarct finding now present  T-wave abnormality now present       Physician Interpreter --     Ventricular Rate 78 //min      QRS Duration 75 ms      P-R Interval -- ms      Q-T Interval 376 ms      Q-T Interval(Corrected) 429 ms      P Wave Axis -- deg      QRS Axis  1 deg      T Axis -- years                    RADIOLOGY EVALUATIONS:     Xr Chest 2 Views    Result Date: 08/16/2017  1.  There is a small to moderate amount of right pleural fluid or thickening. This represents pleural fluid, it is likely loculated. 2.  Right basilar and perihilar airspace disease is likely secondary to combination of atelectasis and hilar tumor seen on recent prior CT scan. ReadingStation:[email or web address]    Ct Chest W Contrast    Result Date: 08/13/2017  Right thoracostomy drainage tube projecting at the base with significant decrease in right pleural  effusion. There is significant residual however. Progressive tumor involvement in the right hilum and mediastinum with lymphangitic involvement in the right lung. No pneumothorax. Increasing groundglass opacification in the left lung. Cardiomegaly. ReadingStation:[email or web address]    US Abdomen Limited Ruq    Result Date: 08/19/2017  We will infuse TPA into Pleurx catheter which is otherwise in good position ReadingStation:[email or web address]    Ct Abdomen Pelvis W Iv And Po Cont    Result Date: 08/13/2017  Increasingly bulky and apparently partially necrotic tumor possibly arising from the uterus or located posterior to the uterus. Considerations includes leiomyosarcoma of the uterus or other pelvic neoplasm or less likely distant metastasis from primary lung malignancy. No associated bowel obstruction. Fecal colon. No urinary obstruction or retroperitoneal lymphadenopathy. No evidence of perforation. Recommendations: None. ReadingStation:[email or web address]    Radiological imaging interpreted by the radiologist.               Procedure:     None             MDM:      The patient's presents with shortness of breath. Evaluation and treatment for this patient has been initiated in the ER, but the patient has not had significant improvement in symptoms, appears ill enough and/or has illness/findings/co-morbidities and hypoxemia that make admission for IV medications, oxygen treatment, and further management the most appropriate disposition.  The differential has included but is not limited to pneumonia, bronchitis/COPD, asthma, cardiac pathology, CHF, cancer, pneumothorax.  Diagnostic impression and plan were discussed and agreed upon with the patient and/or family.  Results of lab/radiology tests were reviewed and discussed with the patient and/or family. All questions were answered and concerns addressed.  Appropriate consultation was made for admission and further treatment of this patient.    The patient had < 30 min of critical care time  required to treat and stabilize.      Medications   potassium chloride (K-DUR,KLOR-CON) CR tablet 40 mEq (40 mEq Oral Given 08/16/17 1806)   alteplase (CATH-FLO) 10 mg in sodium chloride 0.9 % 30 mL intrapleural syringe (10 mg Intrapleural Given 08/18/17 1523)            Assessment/Plan:      Clinical Impression  1. Malignant pleural effusion    2. Dyspnea, unspecified type          Discharge Medication List as of 08/18/2017  7:46 PM              Follow up for Discharged Patients  Snelgrove, Laqueta Due, MD  [address]  [place] [address]  [phone]    Follow up on 08/27/2017  Please schedule follow up appointment for 1 week   June 12,2019 9:30 with [name] the NP    Kern Alberta, MD  [address]  [place] [address]  [phone]    Follow up in 2 week(s)          Disposition  ED Disposition     ED Disposition Condition Date/Time Comment    Admit  Sat Aug 16, 2017  1:07 PM Admitting Physician: Astrid Drafts [[id number]]   Diagnosis: Dyspnea [241871]   Estimated Length of Stay: > or = to 2 midnights   Tentative Discharge Plan?: Home or Self Care [1]   Patient Class: Inpatient [101]                   Portion of the history and physical is documented is by my scribe, Darvin Neighbours which accurately reflects my evaluation.     Signed by: Marcellus Scott, MD    Note: This chart was generated by the Epic EMR system/ speech recognition and may contain inherent errors, including typographical, or omissions not intended by the user               Marcellus Scott, MD  08/28/17 1811

## 2017-08-16 NOTE — ED Notes (Signed)
Bed: N3-A  Expected date:   Expected time:   Means of arrival: Car  Comments:

## 2017-08-16 NOTE — Plan of Care (Signed)
Problem: Inadequate Gas Exchange  Goal: Adequate oxygenation and improved ventilation  VS stable on 2L 02. Dyspnea with exertion. Sits up for meals, and gets to [facility] with x1 assist. No complaints of pain.

## 2017-08-16 NOTE — H&P (Signed)
Medicine History & Physical - [facility], [place]   Patient Name: Kristina [name], Kristina [name] LOS: 0 days   Attending Physician: Astrid Drafts, MD PCP: Ellender Hose, MD      Assessment and Plan:                                                                Acute on chronic insufficiency due to worsening malignant pleural effusion  Recurrent pleural effusion -status post thoracostomy drainage tube on the right side  Will admit to medical floor  Monitor respiratory status closely   O2 supplementation  Trial of Lasix    New diagnosis uterine tumor  Right lung tumor with lymphangitic involvement  We will consult oncology for further treatment options  Discussed findings with the patient's medical power of attorney her son Alinda Money and her daughter Tobi Bastos-    and answered the questions.    History of chronic atrial fibrillation-on anticoagulation with Eliquis continue with diltiazem for rate control.    Hypokalemia-we will repeat potassium    Hypothyroidism continue levothyroxine    Insomnia-on Remeron    History of congestive heart failure-continue with home medications patient will be on IV Lasix        DVT PPx: Eliquis  Dispo: To be determined  Healthcare Proxy: Son Alinda Money  Code: do not resuscitate   Active Hospital Problem List  Active Problems:    Dyspnea     Subjective   History of Presenting Illness                                CC: Shortness of breath  Kristina [name] is a 84 y.o. female patient with recurrent pleural effusion and recently diagnosed with uterine tumor and tumor in the lung presented with worsening shortness of breath.  Patient has a thoracostomy drainage tube on the right  , despite draining the fluid she still has significant right-sided pleural effusion on the imaging studies.  Patient is being followed by Dr. Yetta Barre   and had a CT scan of her abdomen on Wednesday that shows necrotic tumor in her uterus.  Patient states she has not been started on any chemotherapy  or radiation  .  The patient denies any chest pain, nausea, vomiting abdominal pain no vaginal bleeding or discharge, denies any rectal bleeding,    Review of Systems:    All systems were reviewed and are negative unless pertinent positive stated in HPI.  CONSTITUTIONAL: Loss of appetite, malaise and fatigue   RESPIRATORY: Progressively worsening shortness of breath, despite recurrent pleural effusion drainage    cARDIOVASCULAR: No chest pains. No palpitations.   GASTROINTESTINAL: No abdominal pain. No vomiting. No diarrhea or constipation.  No hematochezia  GENITOURINARY: No urgency. No frequency. No dysuria.  Denies any vaginal bleeding or discharge  MUSCULOSKELETAL: No musculoskeletal pain. No joint swelling.   NEUROLOGICAL: No headache or neck pain. No syncope or seizure.   PSYCHIATRIC: No depression, no psychosis.  SKIN: No rashes. No lesions. No petechiae.  ENDOCRINE: No unexplained weight loss. No polydipsia.   HEMATOLOGIC: No anemia. No purpura. No bleeding.   ALLERGIC AND IMMUNOLOGIC: No pruritus. No swelling      Objective  Physical Exam:     Vitals: T:97.5 F (36.4 C) (Oral),  BP:124/70, HR:85, RR:(!) 28, SaO2:90%    1) General Appearance: Elderly frail-appearing female alert and oriented x 4. In no acute distress.   2) Eyes: Pink conjunctiva, anicteric sclera. Pupils are equally reactive to light.  3) ENT: Oral mucosa moist with no pharyngeal congestion, erythema or swelling.  4) Neck: Supple, with full range of motion. Trachea is central, no JVD noted  5) Chest: Breath sounds are diminished, she has a Pleurx catheter on the right side  5) CVS: normal rate and regular rhythm, with no murmurs.  6) Abdomen: Soft, non-tender, no palpable mass. Bowel sounds normal. No CVA tenderness  7) Extremities: No pitting edema, pulses palpable, no calf swelling and gross no deformity.  8) Skin: Warm, dry with normal skin turgor, no rash  9) Lymphatics: No lymphadenopathy in axillary, cervical and inguinal area.   10)  Neurological: Cranial nerves II-XII intact. No gross focal motor or sensory deficits noted.  11) Psychiatric: Affect is appropriate. No hallucinations.  Patient Vitals for the past 12 hrs:   BP Temp Pulse Resp   08/16/17 1605 124/70 97.5 F (36.4 C) 85 (!) 28   08/16/17 1442 - 98 F (36.7 C) - -   08/16/17 1431 98/76 - 93 22   08/16/17 1146 104/65 - 87 21   08/16/17 0918 119/82 - (!) 114 17   08/16/17 0827 - - (!) 104 (!) 35   08/16/17 0821 - - 98 (!) 30   08/16/17 0755 104/50 97.9 F (36.6 C) (!) 103 20     Weight Monitoring 03/19/2017 06/05/2017 06/17/2017 06/17/2017 06/17/2017 07/01/2017 08/16/2017   Height 160 cm 160 cm - - 160 cm 160 cm 154.9 cm   Height Method - Stated Stated - - Stated -   Weight 57.153 kg 51.71 kg 50.9 kg 50.3 kg 50.3 kg 51 kg 52.5 kg   Weight Method - Stated Actual - Bed Scale Actual -   BMI (calculated) 22.4 kg/m2 20.2 kg/m2 - - 19.7 kg/m2 20 kg/m2 21.9 kg/m2       EKG:    Recent Results (from the past 24 hour(s))   ECG 12 lead (Stat)    Collection Time: 08/16/17  8:25 AM   Result Value Ref Range    Patient Age 79 years    Patient DOB 1929-05-12     Patient Height      Patient Weight      Interpretation Text       Atrial fibrillation  Low voltage, extremity and precordial leads  Anteroseptal infarct, old  Nonspecific T abnormalities, lateral leads  Compared to ECG 06/17/2017 13:09:01  Myocardial infarct finding now present  T-wave abnormality now present      Physician Interpreter      Ventricular Rate 78 //min    QRS Duration 75 ms    P-R Interval  ms    Q-T Interval 376 ms    Q-T Interval(Corrected) 429 ms    P Wave Axis  deg    QRS Axis 1 deg    T Axis  years   I-Stat Chem 8    Collection Time: 08/16/17  9:20 AM   Result Value Ref Range    I-STAT Notification Istat Notification    CBC and differential    Collection Time: 08/16/17  9:25 AM   Result Value Ref Range    WBC 9.8 4.0 - 11.0 K/cmm    RBC 3.93 3.80 -  5.00 M/cmm    Hemoglobin 13.2 12.0 - 16.0 gm/dL    Hematocrit 40.3 47.4 - 48.0 %    MCV  102 (H) 80 - 100 fL    MCH 34 28 - 35 pg    MCHC 33 32 - 36 gm/dL    RDW 25.9 (H) 56.3 - 14.0 %    PLT CT 404 130 - 440 K/cmm    MPV 7.1 6.0 - 10.0 fL    NEUTROPHIL % 80.6 (H) 42.0 - 78.0 %    Lymphocytes 11.5 (L) 15.0 - 46.0 %    Monocytes 6.8 3.0 - 15.0 %    Eosinophils % 0.5 0.0 - 7.0 %    Basophils % 0.6 0.0 - 3.0 %    Neutrophils Absolute 7.9 1.7 - 8.6 K/cmm    Lymphocytes Absolute 1.1 0.6 - 5.1 K/cmm    Monocytes Absolute 0.7 0.1 - 1.7 K/cmm    Eosinophils Absolute 0.0 0.0 - 0.8 K/cmm    BASO Absolute 0.1 0.0 - 0.3 K/cmm    RBC Morphology RBC Morphology Reviewed     Anisocytosis 1+     Elliptocytes 1+     Poikilocytosis 1+     Target Cells 1+    i-Stat Chem 8 CartrIDge    Collection Time: 08/16/17  9:33 AM   Result Value Ref Range    i-STAT Sodium 134 (L) 136 - 147 mMol/L    i-STAT Potassium 3.2 (L) 3.5 - 5.3 mMol/L    i-STAT Chloride 90 (L) 98 - 110 mMol/L    TCO2, ISTAT 33 (H) 24 - 29 mMol/L    Ionized Ca, ISTAT 4.30 (L) 4.35 - 5.10 mg/dL    i-STAT Glucose 875 (H) 71 - 99 mg/dL    i-STAT Creatinine 6.43 0.60 - 1.20 mg/dL    i-STAT BUN 17 7 - 22 mg/dL    Anion Gap, ISTAT 32.9 7.0 - 16.0    EGFR 58 (L) 60 - 150 mL/min/1.52m2    i-STAT Hematocrit 43.0 36.0 - 48.0 %    i-STAT Hemoglobin 14.6 12.0 - 16.0 gm/dL        Past Medical History:   Diagnosis Date   . A-fib    . Aphasia    . Arrhythmia     afib   . Bradycardia    . Cerebral infarction    . Chest pain    . Congestive heart failure    . Diarrhea    . Disorder of thyroid     hypo   . Hyperlipidemia    . Hypertension    . Melanoma    . Rheumatoid arthritis    . Vitamin D deficiency       Past Surgical History:   Procedure Laterality Date   . arm surgery      roght   . HIP SURGERY      left      No family history on file.   Social History   Substance Use Topics   . Smoking status: Never Smoker   . Smokeless tobacco: Never Used   . Alcohol use Yes      Comment: rarely glass of wine      Allergies   Allergen Reactions   . Lisinopril Anaphylaxis   . Gluten Meal        Xr Chest 2 Views    Result Date: 08/16/2017  1.  There is a small to moderate amount of right pleural fluid or thickening. This represents  pleural fluid, it is likely loculated. 2.  Right basilar and perihilar airspace disease is likely secondary to combination of atelectasis and hilar tumor seen on recent prior CT scan. ReadingStation:[email or web address]    Ct Chest W Contrast    Result Date: 08/13/2017  Right thoracostomy drainage tube projecting at the base with significant decrease in right pleural effusion. There is significant residual however. Progressive tumor involvement in the right hilum and mediastinum with lymphangitic involvement in the right lung. No pneumothorax. Increasing groundglass opacification in the left lung. Cardiomegaly. ReadingStation:[email or web address]    Ct Abdomen Pelvis W Iv And Po Cont    Result Date: 08/13/2017  Increasingly bulky and apparently partially necrotic tumor possibly arising from the uterus or located posterior to the uterus. Considerations includes leiomyosarcoma of the uterus or other pelvic neoplasm or less likely distant metastasis from primary lung malignancy. No associated bowel obstruction. Fecal colon. No urinary obstruction or retroperitoneal lymphadenopathy. No evidence of perforation. Recommendations: None. ReadingStation:[email or web address]     Home Medications     Med List Status:  Pharmacy Completed Set By: Judie Petit, PharmD at 08/16/2017 12:51 PM        Status Comment         08/16/2017  8:36 AM    Med rec per pt states meds havent changed and took her doses this am                acetaminophen (TYLENOL) 325 MG tablet     Take 650 mg by mouth every 4 (four) hours as needed for Pain.     apixaban (ELIQUIS) 2.5 MG     Take 2.5 mg by mouth every 12 (twelve) hours     bimatoprost (LUMIGAN) 0.03 % ophthalmic drops     Place 1 drop into both eyes nightly     dilTIAZem (TIAZAC) 300 MG 24 hr capsule     Take 300 mg by mouth daily     fluticasone (FLONASE) 50 MCG/ACT nasal spray     1  spray by Nasal route daily as needed for Rhinitis or Allergies.         folic acid (FOLVITE) 1 MG tablet     Take 1 mg by mouth daily.     furosemide (LASIX) 40 MG tablet     Take 1 tablet (40 mg total) by mouth daily.     lactase (LACTAID) 3000 units tablet     Take 1 tablet by mouth 3 (three) times daily with meals as needed         levothyroxine (SYNTHROID, LEVOTHROID) 125 MCG tablet     Take 125 mcg by mouth Once a day at 6:00am     methotrexate 2.5 MG tablet     Take 10 mg by mouth once a week Weekly on Mondays        mirtazapine (REMERON) 15 MG tablet     Take 15 mg by mouth nightly     nitroglycerin (NITROSTAT) 0.4 MG SL tablet     Place 0.4 mg under the tongue every 5 (five) minutes as needed for Chest pain.         PROAIR HFA 108 (90 Base) MCG/ACT inhaler     Inhale 2 puffs into the lungs 2 (two) times daily as needed for Wheezing or Shortness of Breath.            Ongoing Comment    Stottlemyer, Minerva Areola, PharmD    06/17/2017  4:20 PM    Unable to verify  diltiazem or methotrexate dose with outpatient pharmacy. Left on profile per last admission and discharge. Pt unsure of medications. No list available. Med history updated with outpatient pharmacy and PCP records. Please update med history as appropriate.            Meds given in the ED:  Medications   apixaban (ELIQUIS) tablet 2.5 mg (2.5 mg Oral Not Given 08/16/17 1556)   latanoprost (XALATAN) 0.005 % ophthalmic solution 1 drop (not administered)   levothyroxine (SYNTHROID, LEVOTHROID) tablet 125 mcg (125 mcg Oral Not Given 08/16/17 1557)   mirtazapine (REMERON) tablet 15 mg (not administered)   furosemide (LASIX) tablet 40 mg (40 mg Oral Not Given 08/16/17 1557)   folic acid (FOLVITE) tablet 1 mg (1 mg Oral Not Given 08/16/17 1557)   methotrexate tablet 10 mg (not administered)   albuterol (PROVENTIL HFA;VENTOLIN HFA) inhaler 2 puff (not administered)   sodium chloride (PF) 0.9 % injection 3 mL (3 mLs Intravenous Given 08/16/17 1534)   naloxone (NARCAN) injection 0.4  mg (not administered)   lactated ringers infusion ( Intravenous New Bag 08/16/17 1541)   ondansetron (ZOFRAN-ODT) disintegrating tablet 4 mg (not administered)     Or   ondansetron (ZOFRAN) injection 4 mg (not administered)   dilTIAZem (CARDIZEM CD) 24 hr capsule 300 mg (300 mg Oral Not Given 08/16/17 1556)         Astrid Drafts, MD     08/16/17,4:50 PM   MRN: [id number]                                      CSN: [id number] DOB: 08-Oct-1929

## 2017-08-16 NOTE — ED Notes (Signed)
Pt to bsc to void without assist. Tolerated well, pt stable with weight bearing. Pt now in stretcher, on cardiac monitor. 2lpm nc. Daughter at bedside. CYU voided.

## 2017-08-16 NOTE — ED Notes (Signed)
Pt placed on 2lpm nc per room air sats 84%. sats now 94%.

## 2017-08-17 DIAGNOSIS — E43 Unspecified severe protein-calorie malnutrition: Secondary | ICD-10-CM | POA: Diagnosis present

## 2017-08-17 LAB — COMPREHENSIVE METABOLIC PANEL
ALT: 15 U/L (ref 0–55)
AST (SGOT): 31 U/L (ref 10–42)
Albumin/Globulin Ratio: 0.63 Ratio — ABNORMAL LOW (ref 0.80–2.00)
Albumin: 2 gm/dL — ABNORMAL LOW (ref 3.5–5.0)
Alkaline Phosphatase: 133 U/L (ref 40–145)
Anion Gap: 13.4 mMol/L (ref 7.0–18.0)
BUN / Creatinine Ratio: 17.6 Ratio (ref 10.0–30.0)
BUN: 13 mg/dL (ref 7–22)
Bilirubin, Total: 0.4 mg/dL (ref 0.1–1.2)
CO2: 31 mMol/L — ABNORMAL HIGH (ref 20.0–30.0)
Calcium: 8.1 mg/dL — ABNORMAL LOW (ref 8.5–10.5)
Chloride: 95 mMol/L — ABNORMAL LOW (ref 98–110)
Creatinine: 0.74 mg/dL (ref 0.60–1.20)
EGFR: 74 mL/min/{1.73_m2} (ref 60–150)
Globulin: 3.2 gm/dL (ref 2.0–4.0)
Glucose: 79 mg/dL (ref 71–99)
Osmolality Calc: 271 mOsm/kg — ABNORMAL LOW (ref 275–300)
Potassium: 3.4 mMol/L — ABNORMAL LOW (ref 3.5–5.3)
Protein, Total: 5.2 gm/dL — ABNORMAL LOW (ref 6.0–8.3)
Sodium: 136 mMol/L (ref 136–147)

## 2017-08-17 LAB — CBC AND DIFFERENTIAL
Bands: 1 % (ref 0–10)
Basophils %: 0 % (ref 0.0–3.0)
Basophils Absolute: 0 10*3/uL (ref 0.0–0.3)
Eosinophils %: 2 % (ref 0.0–7.0)
Eosinophils Absolute: 0.2 10*3/uL (ref 0.0–0.8)
Hematocrit: 33.8 % — ABNORMAL LOW (ref 36.0–48.0)
Hemoglobin: 10.8 gm/dL — ABNORMAL LOW (ref 12.0–16.0)
Lymphocytes Absolute: 0.4 10*3/uL — ABNORMAL LOW (ref 0.6–5.1)
Lymphocytes: 5 % — ABNORMAL LOW (ref 15.0–46.0)
MCH: 33 pg (ref 28–35)
MCHC: 32 gm/dL (ref 32–36)
MCV: 103 fL — ABNORMAL HIGH (ref 80–100)
MPV: 7.7 fL (ref 6.0–10.0)
Monocytes Absolute: 0.8 10*3/uL (ref 0.1–1.7)
Monocytes: 9 % (ref 3.0–15.0)
Neutrophils %: 83 % — ABNORMAL HIGH (ref 42.0–78.0)
Neutrophils Absolute: 7.1 10*3/uL (ref 1.7–8.6)
PLT CT: 371 10*3/uL (ref 130–440)
RBC: 3.29 10*6/uL — ABNORMAL LOW (ref 3.80–5.00)
RDW: 17.4 % — ABNORMAL HIGH (ref 11.0–14.0)
WBC: 8.4 10*3/uL (ref 4.0–11.0)

## 2017-08-17 MED ORDER — LORAZEPAM 2 MG/ML PO CONC
0.25 mg | ORAL | Status: DC | PRN
Start: 2017-08-17 — End: 2017-08-19

## 2017-08-17 MED ORDER — MORPHINE SULFATE (CONCENTRATE) 100 MG/5ML PO SOLN
2.50 mg | ORAL | Status: DC | PRN
Start: 2017-08-17 — End: 2017-08-19

## 2017-08-17 NOTE — Malnutrition Assessment (Signed)
Kristina [name] / [id number]      Malnutrition Documentation    Severe Protein Energy Malnutrition related to chronic illness (uterine cancer) as evidenced by severe muscle wasting from temples/shoulders/hands, severe fat loss from face/chest/arms, intake <75% estimated needs for >1 month.     Recent Labs  Lab 08/17/17  0516   Albumin 2.0*       Dietitian intervened and is following.  Concurrent to this note, the diagnosis will be placed on the Problem List.  If physician disagrees with diagnosis, please notify the dietitian at extension [phone].    Evlyn Clines, RD, CSOWM  Contact me via Cortext  Pager [phone]  Office [phone]  08/17/17 9:14 AM

## 2017-08-17 NOTE — Progress Notes (Signed)
Date Time: 08/17/17 7:35 AM  Patient Name: Kristina [name]    Patient Active Problem List   Diagnosis   . Hematochezia   . PAF (paroxysmal atrial fibrillation)   . HTN (hypertension)   . Hypokalemia   . Hypothyroidism   . Chronic systolic (congestive) heart failure   . Cerebrovascular accident (CVA) with intracranial hemorrhage   . Acute CVA (cerebrovascular accident)   . Pleural effusion due to congestive heart failure   . Pleural effusion   . Sepsis   . Dyspnea       History of Presenting Illness:   Kristina [name] is a 84 y.o. female who is seen today for ongoing assessment and management of adenocarcinoma of GYN malignancy (uterine) who presented with acute on chronic respiratory insufficiency due to worsening malignant pleural effusion despite chronic drain tube on right.    HISTORY OF PRESENT ILLNESS:  Kristina [name] is a pleasant 84 year old woman who has had a chronic right-sided pleural effusion dating back at least to December 2018.  CT scan at that time showed a large right pleural effusion.  There was also increasing adenopathy in the mediastinum.  Chest x-ray showed cardiomegaly with bilateral pleural effusions right greater than left.  Chest tube was placed in March 2019, and she was having the fluid drained twice weekly.  This was complicated by an infection from the pleural fluid in April 2019.  It was felt the effusion was probably related to her underlying cardiomyopathy.  Fluid had been tested and returned suggesting an exudate by LDH criteria.  The fluid has been refractory to medical treatment.  In May 2019, the fluid was sent for cytology and it was positive for malignancy.  It was felt this was consistent with metastatic adenocarcinoma - GYN primary tumor.  Cells were positive for a variety of immunohistochemical stains including estrogen receptor.  The cells were also positive for CA 125.  The patient has not had a recent GYN evaluation.  she does report that she is up-to-date with  her mammogram.    She was seen and evaluated by Dr Yetta Barre on 08/08/17.  They reviewed her advanced malignancy and significant co morbidities.  She was thought not to be a candidate for any aggressive surgery or chemotherapy.  She and her family desired improved quality of life and was in agreement to focus on palliation of symptoms.  She requested that she be referred to GYN Oncologist with Dr. Dionne Milo in [place] for gyn oncology evaluation.      On 08/13/17, she completed staging scans revealing increasingly bulky and apparently partially necrotic tumor possibly arising from the uterus or located posterior to the uterus.  Considerations includes leiomyosarcoma of the uterus or other pelvic neoplasm, or less likely distant metastasis from primary lung malignancy. No associated bowel obstruction.  No urinary obstruction or retroperitoneal lymphadenopathy. No evidence of perforation.  Her chest demonstrated right thoracostomy drainage tube projecting at the base with significant decrease in right pleural effusion. There is significant residual however.  Progressive tumor involvement in the right hilum and mediastinum with lymphangitic involvement in the  right lung. No pneumothorax. Increasing groundglass opacification in the left lung.  Cardiomegaly.    She was to return to Dr Yetta Barre post CT and GYN evaluation to discuss further palliative care options including possible use of hormonal treatment.  At that appointment, Hospice support was to be discussed again.  Patient and family in agreement to a hospice approach to care depending upon staging studies  and GYN recommendations.      Subjective:   Long discussion with patient and daughter and brother by phone.  We reviewed CT results and current clinical situation.  We discussed that most likely her pleural fluids are loculated and that most likely there is no significant intervention that can be employed.  With EF of 30-35%, not sure if VAT's candidate.  We  discussed using Morphine and Ativan small doses for palliation.  In addition, they would like [facility]) upon discharge for O2, bed, etc.      Review of Systems:   All other systems were reviewed and are negative     MEDS:   Medications were reviewed in the electronic record    Physical Exam:     Vitals:    08/17/17 0718   BP: 134/60   Pulse: 90   Resp: 18   Temp: 98.4 F (36.9 C)   SpO2: 95%       Intake/Output Summary (Last 24 hours) at 08/17/17 0735  Last data filed at 08/17/17 5643   Gross per 24 hour   Intake             1260 ml   Output              650 ml   Net              610 ml     Physical Exam:  General:  Pleasant and chronically ill appearing female in no acute distress.  HEENT:  PERLA, EEOM intact, conjuctivia clear, sclera without icterus.  Oral:  Mucous membranes moist, pink and intact without ulcerations, lesions or thrush.  Neck:  Supple with FROM.  Lymph exam negative.  Lungs:  Clear throughout left zones.  Right with scattered coarse rales and diminished.  Heart:  S1-S2, RRR.  No gallop or murmur.    Abomen:  Active bowel sounds. Non-tender.  Negative guarding or rebound.  No masses or organomegaly.  Extremities:  Without cyanosis, clubbing or edema.  Pulses present.  Neuro:  CN II-XII intact.  Muscle strength normal.       LABS:   Labs were reviewed and are notable for:    Recent Labs  Lab 08/17/17  0516 08/16/17  0925   WBC 8.4 9.8   Hemoglobin 10.8* 13.2   Hematocrit 33.8* 39.9   MCV 103* 102*   PLT CT 371 404       Recent Labs  Lab 08/17/17  0516 08/16/17  0933   Sodium 136  --    Potassium 3.4*  --    Chloride 95*  --    CO2 31.0*  --    BUN 13  --    Creatinine 0.74  --    i-STAT Creatinine  --  0.90   Glucose 79  --    Calcium 8.1*  --        Recent Labs  Lab 08/17/17  0516   ALT 15   AST (SGOT) 31   Bilirubin, Total 0.4   Albumin 2.0*   Alkaline Phosphatase 133           IMAGING DATA:   Imaging was reviewed and is notable for  Xr Chest 2 Views    Result Date: 08/16/2017  1.   There is a small to moderate amount of right pleural fluid or thickening. This represents pleural fluid, it is likely loculated. 2.  Right basilar and perihilar airspace disease is likely secondary to  combination of atelectasis and hilar tumor seen on recent prior CT scan. ReadingStation:[email or web address]    Ct Chest W Contrast    Result Date: 08/13/2017  Right thoracostomy drainage tube projecting at the base with significant decrease in right pleural effusion. There is significant residual however. Progressive tumor involvement in the right hilum and mediastinum with lymphangitic involvement in the right lung. No pneumothorax. Increasing groundglass opacification in the left lung. Cardiomegaly. ReadingStation:[facility]    Ct Abdomen Pelvis W Iv And Po Cont    Result Date: 08/13/2017  Increasingly bulky and apparently partially necrotic tumor possibly arising from the uterus or located posterior to the uterus. Considerations includes leiomyosarcoma of the uterus or other pelvic neoplasm or less likely distant metastasis from primary lung malignancy. No associated bowel obstruction. Fecal colon. No urinary obstruction or retroperitoneal lymphadenopathy. No evidence of perforation. Recommendations: None. ReadingStation:[facility]    ASSESSMENT/PLAN:   Kristina [name] is a 84 y.o. female who is seen today for ongoing assessment and management of adenocarcinoma of GYN malignancy (uterine) who presented with acute on chronic respiratory insufficiency due to worsening malignant pleural effusion despite chronic drain tube on right:  1. Adenocarcinoma of probable Uterine source:  As above, she met with Dr Yetta Barre in the outpatient setting where aggressive surgery or chemotherapy was determined not in her best interest.  Palliation was preferred.  As above, Hospice will be consulted.  2. Loculated right pleural effusion:  Will have IR evaluate tomorrow for possible interventions.  Continue gentle diuresis and O2.   3. SOB:  Will add  low dose MSO4 and Ativan for palliation.   4. A-fib:  Continue Eliquis and diltiazem.   5. Hypokalemia:  Continue oral replacement.   6. Hypothyroidism:  Continue Levothyroxine.  7. CHF:  Ejection fraction estimated at 30-35% by ECHO 06/19/17.  Continue diltiazem and furosemide.  8. Disposition:  Hospice consult.  Will need home O2, hospital bed, etc.       Emerson Monte Huff,CFNP  7:35 AM 08/17/2017   [facility]  [phone] or pager [phone]    Attending Attestation:   I have reviewed the interval history and physical exam, images and pertinent test results and personally examined the patient and confirmed the major physical findings of the preceeding note above.   I agree with the above with following comments:    I reviewed the above with Kristina [name] and told her that I thought the decision to pursue hospice care was a reasonable one given the extent of her disease, our available treatment options, her age and frailty.    Total time spent was 30 minutes,the majority of which was spent counseling and coordination of care around the issues of uterine cancer.      SignedLuana Shu, MD  08/17/2017 2:04 PM    [facility]  [phone]

## 2017-08-17 NOTE — Progress Notes (Signed)
NURSE NOTE SUMMARY  [facility] - MEDICAL ONCOLOGY   Patient Name: Kristina [name]   Attending Physician: Astrid Drafts, MD   Today's date:   08/17/2017 LOS: 1 days   Shift Summary:                                                              Pt has been ambulating to the [facility] with +1 assist and has been voiding well.Pt gait is steady and she voiced no complaints of pain. O2 saturation has continued to be good on 2L NC. Pt is sleeping comfortably.    Provider Notifications:      Rapid Response Notifications:  Mobility:      PMP Activity: Step 6 - Walks in Room (08/17/2017  3:00 AM)   Weight tracking:  Family Dynamic:   Last 3 Weights for the past 72 hrs (Last 3 readings):   Weight   08/16/17 0755 52.5 kg (115 lb 11.9 oz)        Healthcare Agent's Name: Thurston Hole   Healthcare Agent's Phone Number: [phone]   Recent Vitals Last Bowel Movement   BP 117/71   Pulse 97   Temp 98.2 F (36.8 C) (Oral)   Resp 20   Ht 1.549 m (5\' 1" )   Wt 52.5 kg (115 lb 11.9 oz)   SpO2 94%   BMI 21.87 kg/m  Last BM Date: 08/16/17

## 2017-08-17 NOTE — Progress Notes (Signed)
VS stable on 2L 02. Low grade temp this afternoon - will continue to monitor.   Attempted to drain pleurex, only got approx 5-10cc's of serosanguinous fluid off.   Pt tolerated well.

## 2017-08-17 NOTE — Progress Notes (Signed)
Nutrition Therapy  Nutrition Assessment    Patient Information:     Name:Kristina [name]   Age: 84 y.o.   Sex: female     MRN: [id number]    Recommendation:     1. Ensure TID (vanilla)    Nutrition History:     Admitted with worsening malignant PE (drain in place), with history of uterine cancer. No significant wt change apparent per wt history. Pt ate 100% of dinner last night. Says her appetite has been down for a while. Looks to weigh less than current wt of 116#. Says she has lost weight but unsure of how much. Says she has celiac disease so follows a gluten free diet at home. No aggressive treatments desired, code status DNR/AND---plan for D/C with hospice/palliative care. Helped her order breakfast.    Nutrition Focus Physical Exam: Abnormal, temporal wasting, sunken cheeks, protruding clavicle, muscle wasting from shoulders/hands, fat loss from arms    Nutrition Risk Level: Moderate    Nutrition Diagnosis:     Severe Protein Energy Malnutrition related to chronic illness (uterine cancer) as evidenced by severe muscle wasting from temples/shoulders/hands, severe fat loss from face/chest/arms, intake <75% estimated needs for >1 month.      Monitoring:  Evaluation:    PO/EN/PN intake:  Total energy intake and Liquid meal replacement, or supplement intake   Labs:  Electrolyte Profile, Renal Profile and Glucose, casual    GI Profile:  Bowel Function   Nutrition Focused Physical:  Overall appearance       Assessment Data:     Admission Dx:  Malignant pleural effusion [J91.0]  Dyspnea, unspecified type [R06.00]  PMH:  has a past medical history of A-fib; Aphasia; Arrhythmia; Bradycardia; Cerebral infarction; Chest pain; Congestive heart failure; Diarrhea; Disorder of thyroid; Hyperlipidemia; Hypertension; Melanoma; Rheumatoid arthritis; and Vitamin D deficiency.  PSH:  has a past surgical history that includes Hip surgery and arm surgery.     Height: 1.549 m (5\' 1" )   Weight: 52.5 kg (115 lb 11.9 oz)   BMI: Body  mass index is 21.87 kg/m.   IBW: 48 kg  Weight Monitoring Weight Weight Method   02/20/2017 50.576 kg Standing Scale   06/17/2017 50.9 kg Actual   07/01/2017 51 kg Actual   08/16/2017 52.5 kg **looks to weigh less     Pertinent Meds: Folic acid, lasix, synthroid, remeron      Recent Labs  Lab 08/17/17  0516 08/16/17  0933 08/13/17  1529   Sodium 136  --   --    Potassium 3.4*  --   --    Chloride 95*  --   --    CO2 31.0*  --   --    BUN 13  --   --    Creatinine 0.74  --   --    i-STAT Creatinine  --  0.90 0.80   Glucose 79  --   --    Calcium 8.1*  --   --           Diet Order:  Orders Placed This Encounter   Procedures   . Diet gluten restricted   . Ensure Enlive Quantity: A. One; Frequency: TID with meals vanilla        GI symptoms:    +BM yesterday   Hydration: WDL  Skin:  WDL    Food Security Issues:  No    Learning Needs:  No education needs at this time    Estimated Needs:  Estimated Energy Needs  Total Energy Estimated Needs: 1325-1590 kcal  Method for Estimating Needs: 25-30 kcal/kg CBW-53  Estimated Protein Needs  Total Protein Estimated Needs: 64-80 g  Method for Estimating Needs: 1.2-1.5 g/kg CBW-53       Additional Comments:       Daiva Eves, RD  08/17/2017 9:12 AM

## 2017-08-17 NOTE — Progress Notes (Signed)
Medicine Progress Note - [facility]ists, [place]   Patient Name: Kristina [name], Kristina [name]: 1 days   Attending Physician: Astrid Drafts, MD PCP: Ellender Hose, MD      Assessment and Plan:                                                                Acute on chronic  respi   insufficiency due to worsening malignant pleural effusion  Recurrent pleural effusion -status post thoracostomy drainage tube on the right side  Continue lasix , has good urine output and diuresing  Allow pleurx to drain d/w RN  IR  evalaution of the pleurx if  Not able to drain     New diagnosis uterine tumor  Right lung tumor with lymphangitic involvement  appreciate oncology follow up  ,  Patient had seen Dr. Yetta Barre as an outpatient and underwent staging work-up.  She was referred for GYN evaluation to discuss further palliative care options.  She was not considered to be a surgical or chemotherapy candidate.  Hospice will be consulted        History of chronic atrial fibrillation-on anticoagulation with Eliquis continue with diltiazem for rate control.    Hypokalemia-we will repeat potassium    Hypothyroidism continue levothyroxine    Insomnia-on Remeron    History of congestive heart failure-continue with home medications patient will be on IV Lasix    Disposition: Hospice   DVT PPX: Eliquis  Code:  NO CPR  -  ALLOW NATURAL DEATH       Discussed plan with oncology PA and RN   Hospital Course   Kristina [name] is a 84 y.o. female patient.  With history of recurrent refractory pleural effusions and cardiomegaly.  Found to have lung malignancy and a necrotic tumor in her uterus  .  Oncology had recommended palliative care.  Patient is admitted with progressively get worsening dyspnea and will need hospice support.     EMR Generated Hospital Problem List   Active Problems:    Dyspnea    Severe protein-calorie malnutrition     Subjective   Was sitting in the chair, continues to have dyspnea and  tachypnea.  Denies any chest pain, no abdominal pain no fever chills night sweats, appetite is good no diarrhea or constipation        Objective   Physical Exam:     Vitals: T:98.4 F (36.9 C) (Oral), BP:134/60, HR:90, RR:18, SaO2:95%    General: Patient is awake. In no acute distress.  Elderly female  HEENT: PERRL, EOMI, no conjunctival drainage, vision is intact.  Neck: supple, no thyromegaly.  Chest crackles heard bilaterally, Pleurx catheter in place  CVS: normal rate and regular rhythm no murmurs, without JVD.  Abdomen: soft, non-tender, no guarding or rigidity, with normal bowel sounds.  Extremities: No pitting edema, pulses palpable, no calf swelling and gross no deformity.  Skin: Warm, dry, no rash and no worrisome lesions.  NEURO: no motor or sensory deficits.  Psychiatric: alert, interactive, appropriate, normal affect.  Weight Monitoring 03/19/2017 06/05/2017 06/17/2017 06/17/2017 06/17/2017 07/01/2017 08/16/2017   Height 160 cm 160 cm - - 160 cm 160 cm 154.9 cm   Height Method - Stated Stated - - Stated -  Weight 57.153 kg 51.71 kg 50.9 kg 50.3 kg 50.3 kg 51 kg 52.5 kg   Weight Method - Stated Actual - Bed Scale Actual -   BMI (calculated) 22.4 kg/m2 20.2 kg/m2 - - 19.7 kg/m2 20 kg/m2 21.9 kg/m2         Intake/Output Summary (Last 24 hours) at 08/17/17 1230  Last data filed at 08/17/17 0836   Gross per 24 hour   Intake             1260 ml   Output              950 ml   Net              310 ml     Body mass index is 21.87 kg/m.   Meds:   Current Facility-Administered Medications   Medication Dose Route Frequency   . apixaban  2.5 mg Oral Q12H   . dilTIAZem  300 mg Oral Daily   . folic acid  1 mg Oral Daily   . furosemide  20 mg Intravenous BID   . latanoprost  1 drop Both Eyes QPM   . levothyroxine  125 mcg Oral Daily at 0600   . mirtazapine  15 mg Oral QHS   . sodium chloride (PF)  3 mL Intravenous Q8H       PRN Meds: albuterol, LORazepam, morphine (concentrate), naloxone, ondansetron **OR** ondansetron.   LABS:    Estimated Creatinine Clearance: 41.9 mL/min (based on SCr of 0.74 mg/dL).    Recent Labs  Lab 08/17/17  0516 08/16/17  0925   WBC 8.4 9.8   RBC 3.29* 3.93   Hemoglobin 10.8* 13.2   Hematocrit 33.8* 39.9   MCV 103* 102*   PLT CT 371 404             Lab Results   Component Value Date    HGBA1CPERCNT 5.7 02/24/2017       Recent Labs  Lab 08/17/17  0516 08/16/17  0933 08/13/17  1529   Glucose 79  --   --    Sodium 136  --   --    Potassium 3.4*  --   --    Chloride 95*  --   --    CO2 31.0*  --   --    BUN 13  --   --    Creatinine 0.74  --   --    i-STAT Creatinine  --  0.90 0.80   EGFR 74 58* 67   Calcium 8.1*  --   --        Recent Labs  Lab 08/17/17  0516   Albumin 2.0*   Protein, Total 5.2*   Bilirubin, Total 0.4   Alkaline Phosphatase 133   ALT 15   AST (SGOT) 31           Invalid input(s):  AMORPHOUSUA   Patient Lines/Drains/Airways Status    Active PICC Line / CVC Line / PIV Line / Drain / Airway / Intraosseous Line / Epidural Line / ART Line / Line / Wound / Pressure Ulcer / NG/OG Tube     Name:   Placement date:   Placement time:   Site:   Days:    Peripheral IV 08/16/17 Left Antecubital  08/16/17    0900    Antecubital    1    Closed/Suction Drain Right Chest Other (Comment) 15.5 Fr.  06/05/17    0848    Chest  73    Wound 08/16/17 Leg Left Scab  08/16/17    1608    Leg    less than 1               Xr Chest 2 Views    Result Date: 08/16/2017  1.  There is a small to moderate amount of right pleural fluid or thickening. This represents pleural fluid, it is likely loculated. 2.  Right basilar and perihilar airspace disease is likely secondary to combination of atelectasis and hilar tumor seen on recent prior CT scan. ReadingStation:[email or web address]    Ct Chest W Contrast    Result Date: 08/13/2017  Right thoracostomy drainage tube projecting at the base with significant decrease in right pleural effusion. There is significant residual however. Progressive tumor involvement in the right hilum and mediastinum with  lymphangitic involvement in the right lung. No pneumothorax. Increasing groundglass opacification in the left lung. Cardiomegaly. ReadingStation:[email or web address]    Ct Abdomen Pelvis W Iv And Po Cont    Result Date: 08/13/2017  Increasingly bulky and apparently partially necrotic tumor possibly arising from the uterus or located posterior to the uterus. Considerations includes leiomyosarcoma of the uterus or other pelvic neoplasm or less likely distant metastasis from primary lung malignancy. No associated bowel obstruction. Fecal colon. No urinary obstruction or retroperitoneal lymphadenopathy. No evidence of perforation. Recommendations: None. ReadingStation:[email or web address]     Time spent: 45 minutes            Astrid Drafts, MD     08/17/17,12:30 PM   MRN: [id number]                                      CSN: [id number] DOB: 08/03/1929

## 2017-08-17 NOTE — Plan of Care (Signed)
Problem: Compromised Hemodynamic Status  Goal: Vital signs and fluid balance maintained/improved  Outcome: Progressing   08/17/17 1706   Goal/Interventions addressed this shift   Vital signs and fluid balance are maintained/improved Position patient for maximum circulation/cardiac output;Monitor/assess vitals and hemodynamic parameters with position changes;Monitor and compare daily weight;Monitor intake and output. Notify LIP if urine output is less than 30 mL/hour.

## 2017-08-17 NOTE — Plan of Care (Signed)
Problem: Impaired Mobility  Goal: Mobility/Activity is maintained at optimal level for patient  Outcome: Progressing   08/17/17 0531   Goal/Interventions addressed this shift   Mobility/activity is maintained at optimal level for patient Increase mobility as tolerated/progressive mobility;Encourage independent activity per ability;Maintain proper body alignment;Perform active/passive ROM;Plan activities to conserve energy, plan rest periods;Reposition patient every 2 hours and as needed unless able to reposition self;Assess for changes in respiratory status, level of consciousness and/or development of fatigue         Pt has ambulated to the [facility] without complaint multiple times this shift and her gait has been steady.

## 2017-08-18 ENCOUNTER — Inpatient Hospital Stay: Payer: Medicare Other

## 2017-08-18 LAB — ECG 12-LEAD
Patient Age: 87 years
Q-T Interval(Corrected): 429 ms
Q-T Interval: 376 ms
QRS Axis: 1 deg
QRS Duration: 75 ms
Ventricular Rate: 78 //min

## 2017-08-18 LAB — CBC AND DIFFERENTIAL
Basophils %: 0.4 % (ref 0.0–3.0)
Basophils Absolute: 0 10*3/uL (ref 0.0–0.3)
Eosinophils %: 1.7 % (ref 0.0–7.0)
Eosinophils Absolute: 0.1 10*3/uL (ref 0.0–0.8)
Hematocrit: 34.7 % — ABNORMAL LOW (ref 36.0–48.0)
Hemoglobin: 10.9 gm/dL — ABNORMAL LOW (ref 12.0–16.0)
Lymphocytes Absolute: 1 10*3/uL (ref 0.6–5.1)
Lymphocytes: 12.4 % — ABNORMAL LOW (ref 15.0–46.0)
MCH: 32 pg (ref 28–35)
MCHC: 31 gm/dL — ABNORMAL LOW (ref 32–36)
MCV: 103 fL — ABNORMAL HIGH (ref 80–100)
MPV: 7.3 fL (ref 6.0–10.0)
Monocytes Absolute: 1.2 10*3/uL (ref 0.1–1.7)
Monocytes: 14.2 % (ref 3.0–15.0)
Neutrophils %: 71.4 % (ref 42.0–78.0)
Neutrophils Absolute: 5.8 10*3/uL (ref 1.7–8.6)
PLT CT: 408 10*3/uL (ref 130–440)
RBC: 3.36 10*6/uL — ABNORMAL LOW (ref 3.80–5.00)
RDW: 17.5 % — ABNORMAL HIGH (ref 11.0–14.0)
WBC: 8.2 10*3/uL (ref 4.0–11.0)

## 2017-08-18 LAB — BASIC METABOLIC PANEL
Anion Gap: 10 mMol/L (ref 7.0–18.0)
BUN / Creatinine Ratio: 18.3 Ratio (ref 10.0–30.0)
BUN: 11 mg/dL (ref 7–22)
CO2: 32.5 mMol/L — ABNORMAL HIGH (ref 20.0–30.0)
Calcium: 7.8 mg/dL — ABNORMAL LOW (ref 8.5–10.5)
Chloride: 96 mMol/L — ABNORMAL LOW (ref 98–110)
Creatinine: 0.6 mg/dL (ref 0.60–1.20)
EGFR: 83 mL/min/{1.73_m2} (ref 60–150)
Glucose: 86 mg/dL (ref 71–99)
Osmolality Calc: 269 mOsm/kg — ABNORMAL LOW (ref 275–300)
Potassium: 3.5 mMol/L (ref 3.5–5.3)
Sodium: 135 mMol/L — ABNORMAL LOW (ref 136–147)

## 2017-08-18 LAB — PT/INR
PT INR: 1.2 (ref 0.5–1.3)
PT: 11.6 s — ABNORMAL HIGH (ref 9.5–11.5)

## 2017-08-18 MED ORDER — SODIUM CHLORIDE 0.9 % IV SOLN
10.00 mg | Freq: Once | INTRAVENOUS | Status: AC
Start: 2017-08-18 — End: 2017-08-18
  Administered 2017-08-18: 15:00:00 10 mg via INTRAPLEURAL
  Filled 2017-08-18: qty 10

## 2017-08-18 NOTE — UM Notes (Signed)
[facility] Utilization Management Review Sheet    Facility :  [facility]    NAME: Kristina [name]  MR#: [id number]    CSN#: [id number]    ROOM: 273/273-A AGE: 84 y.o.     DOB: 21-Jul-1929    ADMIT DATE AND TIME: 08/16/2017  8:12 AM    PATIENT CLASS: inpatient    ATTENDING PHYSICIAN: Astrid Drafts, MD  PAYOR:Payor: MEDICARE / Plan: MEDICARE PART A AND B / Product Type: Medicare /     DIAGNOSIS:     ICD-10-CM    1. 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 Dyspnea, unspecified type R06.00      HISTORY:   Past Medical History:   Diagnosis Date   . A-fib    . Aphasia    . Arrhythmia     afib   . Bradycardia    . Cerebral infarction    . Chest pain    . Congestive heart failure    . Diarrhea    . Disorder of thyroid     hypo   . Hyperlipidemia    . Hypertension    . Melanoma    . Rheumatoid arthritis    . Vitamin D deficiency      Active Hospital Problems    Diagnosis   . Severe protein-calorie malnutrition   . Dyspnea     History of Present Illness   "Kristina [name] is a 84 y.o. female patient with recurrent pleural effusion and recently diagnosed with uterine tumor and tumor in the lung presented with worsening shortness of breath.  Patient has a thoracostomy drainage tube on the right  , despite draining the fluid she still has significant right-sided pleural effusion on the imaging studies.  Patient is being followed by Dr. Yetta Barre   and had a CT scan of her abdomen on Wednesday that shows necrotic tumor in her uterus.  Patient states she has not been started on any chemotherapy or radiation  .  The patient denies any chest pain, nausea, vomiting abdominal pain no vaginal bleeding or discharge, denies any rectal bleeding," Per Marcos Eke, MD H&P notes     Vitals on admission  08/16/17 0755 -- 97.9 F (36.6 C) --  103 95 % -- 20 104/50 --    Treatment in Emergency Department  CXR  ECG    Abnormal Labs  Na 134  K 3.2  EGFR 58    Imagining  XR Chest 2 Views   IMPRESSION:   1.  There is a small to moderate  amount of right pleural fluid or thickening. This represents pleural fluid, it is likely loculated.  2.  Right basilar and perihilar airspace disease is likely secondary to combination of atelectasis and hilar tumor seen on recent prior CT scan.    Assessment/Plan  Acute on chronic insufficiency due to worsening malignant pleural effusion  Recurrent pleural effusion -status post thoracostomy drainage tube on the right side  Will admit to medical floor  Monitor respiratory status closely   O2 supplementation  Trial of Lasix    New diagnosis uterine tumor  Right lung tumor with lymphangitic involvement  We will consult oncology for further treatment options  Discussed findings with the patient's medical power of attorney her son Alinda Money and her daughter Tobi Bastos-    and answered the questions.    History of chronic atrial fibrillation-on anticoagulation with Eliquis continue with diltiazem for rate control.    Hypokalemia-we will repeat potassium  Hypothyroidism continue levothyroxine    Insomnia-on Remeron    History of congestive heart failure-continue with home medications patient will be on IV Lasix    DVT PPx: Eliquis  Dispo: To be determined  Healthcare Proxy: Son Alinda Money  Code: do not resuscitate  Active Hospital Problem List  Active Problems:    Dyspnea    Oncology consult   Long discussion with patient and daughter and brother by phone.  We reviewed CT results and current clinical situation.  We discussed that most likely her pleural fluids are loculated and that most likely there is no significant intervention that can be employed.  With EF of 30-35%, not sure if VAT's candidate.  We discussed using Morphine and Ativan small doses for palliation.  In addition, they would like [facility]) upon discharge for O2, bed, etc.      Meds  Lasix IV 20mg  BID    Karle Plumber, RN  Utilization Review   [facility]  [address]  [place], [place] [address]  Demetrius Charity [phone]  F [phone]

## 2017-08-18 NOTE — Consults (Signed)
IR asked to evaluate for loculated right pleural effusion.  Pt has a history of metastatic cancer.  She had a right pleurex placed for recurrent effusion March 21st.  CT of the chest demonstrates the Pleurex in good position and either loculated effusions or tumor in the pleural space.    Plan to bring to [place] and scan the chest.  Will irrigate the Pleurex and determine what is needed.

## 2017-08-18 NOTE — Progress Notes (Signed)
Pleurex catheter in good position by CT and [place].  Tube flushes easily with sterile saline, but only able to get irrigate out on aspiration.  By [place] the fluid appears simple with some loculations.  Discussed with Dr. Bevelyn Buckles.    Plan to inject the pleurex catheter with 10mg  of TPA and let it dwell for 1 hour and try to aspirate again.

## 2017-08-18 NOTE — Progress Notes (Signed)
[place] instructions given to patient and daughter.  Both verbalize understanding.  [place] to home via wc in stable condition.

## 2017-08-18 NOTE — Progress Notes (Signed)
Patient with Right Pleural pleur-x drain non-functioning.  IR RA J [place] instrilled TPA for approx 1 hour and able to withdraw clots/ de brie.  Then drained  Serosanguinous fluid. Report called to primary nurse- Pleur-X is functional for use.

## 2017-08-18 NOTE — Progress Notes (Signed)
ONCOLOGY - HEMATOLOGY NOTE:    Pt seen in follow up of advanced uterine cancer, right malignant pleural effusion.  Pleural catheter not draining  Spoke with IR this am.  They will re-evaluate and remove catheter if minimal fluid or loculation the issue.    Pt reasonably comfortable at rest.  No pain  Minor cough.  No hemoptysis  Eating well,  Moving bowels  Has DOE    Daughter in room.  Hospice seeing patient today  OK with me for her to go home later today as long as pleural catheter issue resolved/removed and hospice plugged in.    Dr Yetta Barre can follow hospice care after discharge      Signed,  Verlin Grills, MD  9:53 AM 08/18/2017  [facility]  [phone] (c) or pager [phone]

## 2017-08-18 NOTE — Plan of Care (Signed)
Problem: Impaired Mobility  Goal: Mobility/Activity is maintained at optimal level for patient  Outcome: Progressing   08/18/17 4696   Goal/Interventions addressed this shift   Mobility/activity is maintained at optimal level for patient Increase mobility as tolerated/progressive mobility;Encourage independent activity per ability;Maintain proper body alignment;Perform active/passive ROM;Plan activities to conserve energy, plan rest periods;Reposition patient every 2 hours and as needed unless able to reposition self;Assess for changes in respiratory status, level of consciousness and/or development of fatigue;Consult/collaborate with Physical Therapy and/or Occupational Therapy       Pt has been steady on her feet while ambulating to the [facility] and has had no mobility issues this shift.

## 2017-08-18 NOTE — Discharge Summary (Signed)
Medicine Discharge Summary - [facility]  [facility] [place]   Patient Name: Kristina [name]   Attending Physician: Astrid Drafts, MD PCP: @PCP    Date of Admission: 08/16/2017 D/C Date: 08/18/2017   Discharge Diagnoses:   Acute on chronic  respi   insufficiency due to worsening malignant pleural effusion  Recurrent pleural effusion -status post thoracostomy drainage tube on the right side  New diagnosis uterine tumor  Right lung tumor with lymphangitic involvement  Chronic a.fib       Hospital Course     Kristina [name] is a 84 y.o. female patient presented with worsening shortness of breath.  Patient has a history of CHF and chronic atrial fibrillation and also malignant pleural effusions due to lung cancer and newly diagnosed uterine tumor.  Patient already had a Pleurx catheter but apparently it has been malfunction and patient was not able to draining her pleural fluid at home.  Patient was admitted in 6 given IV Lasix to help with diuresis with which her exertional dyspnea did improve, her Pleurx catheter was evaluated by interventional radiology today the Pleurx catheter was found to be Clogged with fibrinogen is material  , which was declotted.  200 mL of pleural fluid was removed.    Her oncologist was consulted regarding her    Advanced uterine cancer and recommended palliative care and hospice patient.  Patient herself was not inclined towards any aggressive chemotherapy or surgical interventions.  Hospice met with the patient and her daughter  .  Patient is in stable condition and will be discharged home today        Active Problems:    Dyspnea    Severe protein-calorie malnutrition  Resolved Problems:    * No resolved hospital problems. *        Discharge Instructions:        Disposition:  home  Diet: Regular Diet  Activity: As tolerated  Discharge Code Status: NO CPR  -  ALLOW NATURAL DEATH  [place], Laqueta Due, MD  [address]  [place] [address]  [phone]    Follow up  on 08/27/2017  Please schedule follow up appointment for 1 week   June 12,2019 9:30 with [name] the NP    Kern Alberta, MD  [address]  [place] [address]  [phone]    Follow up in 2 week(s)       Discharge Medications:                                                                      Discharge Medication List      Taking    acetaminophen 325 MG tablet  Dose:  650 mg  Commonly known as:  TYLENOL  Take 650 mg by mouth every 4 (four) hours as needed for Pain.     apixaban 2.5 MG  Dose:  2.5 mg  Commonly known as:  ELIQUIS  Take 2.5 mg by mouth every 12 (twelve) hours     bimatoprost 0.03 % ophthalmic drops  Dose:  1 drop  Commonly known as:  LUMIGAN  Place 1 drop into both eyes nightly     dilTIAZem 300 MG 24 hr capsule  Dose:  300 mg  Commonly known as:  TIAZAC  Take 300 mg by mouth daily     fluticasone 50 MCG/ACT nasal spray  Dose:  1 spray  Commonly known as:  FLONASE  1 spray by Nasal route daily as needed for Rhinitis or Allergies.     folic acid 1 MG tablet  Dose:  1 mg  Commonly known as:  FOLVITE  Take 1 mg by mouth daily.     furosemide 40 MG tablet  Dose:  40 mg  Commonly known as:  LASIX  Take 1 tablet (40 mg total) by mouth daily.     lactase 3000 units tablet  Dose:  1 tablet  Commonly known as:  LACTAID  Take 1 tablet by mouth 3 (three) times daily with meals as needed     levothyroxine 125 MCG tablet  Dose:  125 mcg  Commonly known as:  SYNTHROID, LEVOTHROID  Take 125 mcg by mouth Once a day at 6:00am     methotrexate 2.5 MG tablet  Dose:  10 mg  For:  Rheumatoid Arthritis  Take 10 mg by mouth once a week Weekly on Mondays     mirtazapine 15 MG tablet  Dose:  15 mg  Commonly known as:  REMERON  Take 15 mg by mouth nightly     nitroglycerin 0.4 MG SL tablet  Dose:  0.4 mg  Commonly known as:  NITROSTAT  Place 0.4 mg under the tongue every 5 (five) minutes as needed for Chest pain.     PROAIR HFA 108 (90 Base) MCG/ACT inhaler  Dose:  2 puff  Generic drug:  albuterol  Inhale 2 puffs  into the lungs 2 (two) times daily as needed for Wheezing or Shortness of Breath.           Discharge Day Exam (08/18/2017):   Blood pressure 114/74, pulse 95, temperature 98.1 F (36.7 C), temperature source Oral, resp. rate 16, height 1.549 m (5\' 1" ), weight 52.5 kg (115 lb 11.9 oz), SpO2 91 %.  Physical Exam  General: Patient is awake. In no acute distress.  HEENT: PERRL, EOMI, no conjunctival drainage, vision is intact.  Neck: supple, no thyromegaly.  Chest: CTA bilaterally. No rhonchi, no wheezing. No use of accessory muscles.  CVS: normal rate and regular rhythm no murmurs, without JVD.  Abdomen: soft, non-tender, no guarding or rigidity, with normal bowel sounds.  Extremities: No pitting edema, pulses palpable, no calf swelling and gross no deformity.  Skin: Warm, dry, no rash and no worrisome lesions.  NEURO: no motor or sensory deficits.  Psychiatric: alert, interactive, appropriate, normal affect.   Recent Labs      Recent Labs  Lab 08/18/17  0653 08/17/17  0516 08/16/17  0925   WBC 8.2 8.4 9.8   RBC 3.36* 3.29* 3.93   Hemoglobin 10.9* 10.8* 13.2   Hematocrit 34.7* 33.8* 39.9   MCV 103* 103* 102*   PLT CT 408 371 404       Recent Labs  Lab 08/18/17  1018   PT 11.6*   PT INR 1.2         Lab Results   Component Value Date    HGBA1CPERCNT 5.7 02/24/2017       Recent Labs  Lab 08/18/17  0653 08/17/17  0516 08/16/17  0933 08/13/17  1529   Glucose 86 79  --   --    Sodium 135* 136  --   --    Potassium 3.5 3.4*  --   --  Chloride 96* 95*  --   --    CO2 32.5* 31.0*  --   --    BUN 11 13  --   --    Creatinine 0.60 0.74  --   --    i-STAT Creatinine  --   --  0.90 0.80   EGFR 83 74 58* 67   Calcium 7.8* 8.1*  --   --        Recent Labs  Lab 08/17/17  0516   Albumin 2.0*   Protein, Total 5.2*   Bilirubin, Total 0.4   Alkaline Phosphatase 133   ALT 15   AST (SGOT) 31      Allergies:      Lisinopril and Gluten meal   Time spent on discharging the patient: 45 minutes   Xr Chest 2 Views    Result Date: 08/16/2017  1.   There is a small to moderate amount of right pleural fluid or thickening. This represents pleural fluid, it is likely loculated. 2.  Right basilar and perihilar airspace disease is likely secondary to combination of atelectasis and hilar tumor seen on recent prior CT scan. ReadingStation:[email or web address]    Ct Chest W Contrast    Result Date: 08/13/2017  Right thoracostomy drainage tube projecting at the base with significant decrease in right pleural effusion. There is significant residual however. Progressive tumor involvement in the right hilum and mediastinum with lymphangitic involvement in the right lung. No pneumothorax. Increasing groundglass opacification in the left lung. Cardiomegaly. ReadingStation:[email or web address]    Ct Abdomen Pelvis W Iv And Po Cont    Result Date: 08/13/2017  Increasingly bulky and apparently partially necrotic tumor possibly arising from the uterus or located posterior to the uterus. Considerations includes leiomyosarcoma of the uterus or other pelvic neoplasm or less likely distant metastasis from primary lung malignancy. No associated bowel obstruction. Fecal colon. No urinary obstruction or retroperitoneal lymphadenopathy. No evidence of perforation. Recommendations: None. ReadingStation:[email or web address]     Astrid Drafts, MD         08/18/17 5:47 PM   MRN: [id number]                                      CSN: [id number] DOB: 07/02/1929

## 2017-08-18 NOTE — Progress Notes (Signed)
[facility]   [address]   [place] [address]       Case Manager    Patient identified as a High Risk for readmission based on the current risk score  Estimated D/C Date:    Risk Score: 29%   If risk score above 75%, recommend Care Coordination Consult with Palliative Care to MD: N/A   RX Coverage: Yes-Medicare A/B and [employer] AARP   Utilize D/C Medication Program: No-prefers [facility]   Confirmed PCP with patient: name/last visit Kristina [name]   Confirmed Transportation to F/u Appt: Yes-daughter   PCP F/U Appt request is placed in the Navigator: Yes-AA to schedule   Microbiologist for AD if not present or addressed: N/A has [place] POST form   Anticipated DME needed for D/C: [place]oplan discharge home with hospice   Anticipated HH, arrange if appropriate: No-plan discharge home with hospice   Anticipated Placement:  Referral to DCP: No-plan discharge home with hospice   Inpatient Plan of Care:    Admitted with acute on chronic respiratory failure d/t insufficiency d/t worsening malignant pleural effusion. IR to evaluate R pleurx placement and R pleural effusion.     CM Interventions:   Chart screened and met with pt and daughter. [facility] referral and can admit to home hospice tomorrow at 10:00am. Pt would like to go home today-both pt and daughter aware that [facility] [place] [facility] plans to admit tomorrow at 10:00am-attending notified. Daughter to transport.            08/18/17 1403   Patient Type   Within 30 Days of Previous Admission? No   Healthcare Decisions   Interviewed: Patient;Family   Interviewee Contact Information: Kristina [name] [phone]   Orientation/Decision Making Abilities of Patient Alert and Oriented x3, able to make decisions   Advance Directive Patient has advance directive, copy in chart  ([place] POST form only-MPOA not in EMR)   Advance Directive not in Chart Copy requested from family/decision maker   Prior to admission   Prior level of function Independent with ADLs;Ambulates independently    Home Layout Multi-level;Able to live on main level with bedroom/bathroom;Other  (No STE)   Have running water, electricity, heat, etc? Yes   Living Arrangements Children   How do you get to your MD appointments? Daughter, Kristina [name]   How do you get your groceries? Daughter, Kristina [name]   Who fixes your meals? Daughter, Kristina [name]   Who does your laundry? Daughter, Kristina [name]   Who picks up your prescriptions? Daughter, Patent attorney Independent   DME Currently at Home Single point cane;Other (Comment)  Aeronautical engineer)   Home Care/Community Services Skilled nursing   Name of Agency [facility]   Discharge Planning   Support Systems Children   Patient expects to be discharged to: Home   Anticipated [place] plan discussed with: Same as interviewed   Mode of transportation: Private car (family member)   Consults/Providers   PT Evaluation Needed 2   OT Evalulation Needed 2   SLP Evaluation Needed 2   Correct PCP listed in Epic? Yes  Sharma Covert [place])       Kristina Duncans RN, BSN/Case Manager  [phone]

## 2017-08-18 NOTE — Progress Notes (Signed)
NURSE NOTE SUMMARY  [facility] - MEDICAL ONCOLOGY   Patient Name: Kristina [name]   Attending Physician: Astrid Drafts, MD   Today's date:   08/18/2017 LOS: 2 days   Shift Summary:                                                              Pt has ambulated in the room to the [facility] with no complaint. Slept well this shift and took all medications.    Provider Notifications:      Rapid Response Notifications:  Mobility:      PMP Activity: Step 6 - Walks in Room (08/17/2017 11:00 PM)   Weight tracking:  Family Dynamic:   Last 3 Weights for the past 72 hrs (Last 3 readings):   Weight   08/16/17 0755 52.5 kg (115 lb 11.9 oz)        Healthcare Agent's Name: Thurston Hole   Healthcare Agent's Phone Number: [phone]   Recent Vitals Last Bowel Movement   BP 122/71   Pulse 86   Temp 98.2 F (36.8 C) (Oral)   Resp 22   Ht 1.549 m (5\' 1" )   Wt 52.5 kg (115 lb 11.9 oz)   SpO2 92%   BMI 21.87 kg/m  Last BM Date: 08/16/17

## 2017-08-18 NOTE — Plan of Care (Signed)
Problem: Inadequate Gas Exchange  Goal: Patent Airway maintained  Outcome: Progressing   08/18/17 1449   Goal/Interventions addressed this shift   Patent airway maintained  Position patient for maximum ventilatory efficiency;Provide adequate fluid intake to liquefy secretions;Suction secretions as needed;Reinforce use of ordered respiratory interventions (i.e. CPAP, BiPAP, Incentive Spirometer, Acapella, etc.);Reposition patient every 2 hours and as needed unless able to self-reposition

## 2017-08-18 NOTE — Progress Notes (Signed)
10mg  of TPA mixed in 30mL of sterile saline was injected into the Pleurex catheter.  The medication was allowed to dwell for approximately 1 hour.  THe tube was again flushed and was easily able to aspirate pleural fluid.  Several large pieces of fibrinous debris was pulled from the catheter.    The catheter was then placed to a Pleurex suction bottle and 200 mL's of pleural fluid was removed.  The tube was capped and dressed in the normal fashion.    Tube can be used whenever needed.    If tube continues to clog, we can use thrombolytics again or remove the tube and perform thoracentesis whenever indicated.

## 2017-08-19 DIAGNOSIS — J918 Pleural effusion in other conditions classified elsewhere: Secondary | ICD-10-CM | POA: Diagnosis not present

## 2017-08-19 DIAGNOSIS — C541 Malignant neoplasm of endometrium: Secondary | ICD-10-CM | POA: Diagnosis not present

## 2017-08-21 DIAGNOSIS — J918 Pleural effusion in other conditions classified elsewhere: Secondary | ICD-10-CM | POA: Diagnosis not present

## 2017-08-21 DIAGNOSIS — C541 Malignant neoplasm of endometrium: Secondary | ICD-10-CM | POA: Diagnosis not present

## 2017-08-22 DIAGNOSIS — C541 Malignant neoplasm of endometrium: Secondary | ICD-10-CM | POA: Diagnosis not present

## 2017-08-22 DIAGNOSIS — J918 Pleural effusion in other conditions classified elsewhere: Secondary | ICD-10-CM | POA: Diagnosis not present

## 2017-08-26 DIAGNOSIS — C541 Malignant neoplasm of endometrium: Secondary | ICD-10-CM | POA: Diagnosis not present

## 2017-08-26 DIAGNOSIS — J918 Pleural effusion in other conditions classified elsewhere: Secondary | ICD-10-CM | POA: Diagnosis not present

## 2017-09-02 DIAGNOSIS — C541 Malignant neoplasm of endometrium: Secondary | ICD-10-CM | POA: Diagnosis not present

## 2017-09-02 DIAGNOSIS — J918 Pleural effusion in other conditions classified elsewhere: Secondary | ICD-10-CM | POA: Diagnosis not present

## 2017-09-04 DIAGNOSIS — J918 Pleural effusion in other conditions classified elsewhere: Secondary | ICD-10-CM | POA: Diagnosis not present

## 2017-09-04 DIAGNOSIS — C541 Malignant neoplasm of endometrium: Secondary | ICD-10-CM | POA: Diagnosis not present

## 2017-09-05 ENCOUNTER — Emergency Department: Payer: Medicare Other

## 2017-09-05 ENCOUNTER — Emergency Department
Admission: EM | Admit: 2017-09-05 | Discharge: 2017-09-05 | Disposition: A | Discharge status: Home / Self Care | Payer: Medicare Other | Attending: Emergency Medicine | Admitting: Emergency Medicine

## 2017-09-05 DIAGNOSIS — J918 Pleural effusion in other conditions classified elsewhere: Secondary | ICD-10-CM | POA: Diagnosis not present

## 2017-09-05 DIAGNOSIS — L02416 Cutaneous abscess of left lower limb: Secondary | ICD-10-CM | POA: Diagnosis not present

## 2017-09-05 DIAGNOSIS — J90 Pleural effusion, not elsewhere classified: Secondary | ICD-10-CM | POA: Diagnosis not present

## 2017-09-05 DIAGNOSIS — W19XXXA Unspecified fall, initial encounter: Secondary | ICD-10-CM | POA: Diagnosis not present

## 2017-09-05 DIAGNOSIS — Y92009 Unspecified place in unspecified non-institutional (private) residence as the place of occurrence of the external cause: Secondary | ICD-10-CM | POA: Diagnosis not present

## 2017-09-05 DIAGNOSIS — C541 Malignant neoplasm of endometrium: Secondary | ICD-10-CM | POA: Diagnosis not present

## 2017-09-05 DIAGNOSIS — S3992XA Unspecified injury of lower back, initial encounter: Secondary | ICD-10-CM | POA: Diagnosis not present

## 2017-09-05 DIAGNOSIS — R296 Repeated falls: Secondary | ICD-10-CM | POA: Diagnosis not present

## 2017-09-05 DIAGNOSIS — R410 Disorientation, unspecified: Secondary | ICD-10-CM | POA: Diagnosis not present

## 2017-09-05 DIAGNOSIS — L03116 Cellulitis of left lower limb: Secondary | ICD-10-CM | POA: Diagnosis not present

## 2017-09-05 MED ORDER — SODIUM CHLORIDE 0.9 % IV BOLUS
1000.00 mL | Freq: Once | INTRAVENOUS | Status: DC
Start: 2017-09-05 — End: 2017-09-05

## 2017-09-05 NOTE — ED Notes (Signed)
Kristina [name] with [facility] at bedside with patient and at home plan of care discussed.

## 2017-09-05 NOTE — ED Notes (Signed)
Pt resting comfortably with no needs expressed at this time.

## 2017-09-05 NOTE — ED Provider Notes (Signed)
ED DOC NOTE     History provided by a family member    History is limited by altered mental status    HPI     84 y.o. female here for falls at home x2    Onset fall yesterday at fall again today.  Her daughter states that she seems to be more confused than normal.    Associated symptoms left lower extremity leg redness.    Review of Systems   Review of Systems   Unable to perform ROS: Dementia           PAST MEDICAL/SURGICAL HISTORY:  Past Medical History:   Diagnosis Date   . A-fib    . Aphasia    . Arrhythmia     afib   . Bradycardia    . Cerebral infarction    . Chest pain    . Congestive heart failure    . Diarrhea    . Disorder of thyroid     hypo   . Hyperlipidemia    . Hypertension    . Melanoma    . Rheumatoid arthritis    . Vitamin D deficiency       Past Surgical History:   Procedure Laterality Date   . arm surgery      roght   . HIP SURGERY      left     Additional Past Medical/Surgical History:       SOCIAL AND FAMILY HISTORY:  Social History   Substance Use Topics   . Smoking status: Never Smoker   . Smokeless tobacco: Never Used   . Alcohol use Yes      Comment: rarely glass of wine     History reviewed. No pertinent family history.     Additional Social and Family History:  Additional SH: [x]   I reviewed SH above       Additional FH: [x]  I reviewed FH above    Allergies   Allergen Reactions   . Lisinopril Anaphylaxis   . Gluten Meal      Additional Allergies:    Prior to Admission medications    Medication Sig Start Date End Date Taking? Authorizing Provider   apixaban (ELIQUIS) 2.5 MG Take 2.5 mg by mouth every 12 (twelve) hours   Yes [provider]   bimatoprost (LUMIGAN) 0.03 % ophthalmic drops Place 1 drop into both eyes nightly   Yes [provider]   dilTIAZem (TIAZAC) 300 MG 24 hr capsule Take 300 mg by mouth daily   Yes [provider]   fluticasone (FLONASE) 50 MCG/ACT nasal spray 1 spray by Nasal route daily as needed for Rhinitis or Allergies.       Yes [provider]   folic acid (FOLVITE) 1 MG tablet Take 1 mg by mouth daily.   Yes [provider]   lactase (LACTAID) 3000 units tablet Take 1 tablet by mouth 3 (three) times daily with meals as needed       Yes [provider]   levothyroxine (SYNTHROID, LEVOTHROID) 125 MCG tablet Take 125 mcg by mouth Once a day at 6:00am   Yes [provider]   methotrexate 2.5 MG tablet Take 10 mg by mouth once a week Weekly on Mondays    01/21/17  Yes [provider]   mirtazapine (REMERON) 15 MG tablet Take 15 mg by mouth nightly   Yes [provider]   furosemide (LASIX) 40 MG tablet Take 1 tablet (40 mg  total) by mouth daily. 03/25/17 09/05/17 Yes Donnie Coffin, DO   acetaminophen (TYLENOL) 325 MG tablet Take 650 mg by mouth every 4 (four) hours as needed for Pain.    [provider]   nitroglycerin (NITROSTAT) 0.4 MG SL tablet Place 0.4 mg under the tongue every 5 (five) minutes as needed for Chest pain.     01/31/17   [provider]   PROAIR HFA 108 (90 Base) MCG/ACT inhaler Inhale 2 puffs into the lungs 2 (two) times daily as needed for Wheezing or Shortness of Breath.     12/31/16   [provider]       PHYSICAL EXAM   Vitals:    09/05/17 1627   BP:    Pulse:    Resp:    Temp:    SpO2: 98%     [x]  Nursing note reviewed [x]  Vitals reviewed   Pulse Oximetry on Room Air Normal  Physical Exam   Constitutional: She appears well-developed.   Ackley ill-appearing obtundedFrail   HENT:   Head: Normocephalic.   Contusion to occiput   Eyes: Pupils are equal, round, and reactive to light.   Small subconjunctival hemorrhage of the right eye   Neck:   Cervical spine nontender   Cardiovascular:   No murmur heard.  Irregularly irregular tachycardic   Pulmonary/Chest: Effort normal. No respiratory distress. She exhibits no tenderness.   She has an indwelling right-sided pleural catheter daughter states last attempt to drain was on Tuesday   Abdominal: Soft. She  exhibits no distension. There is no tenderness.   Musculoskeletal: She exhibits no tenderness or deformity.   Nose and hips are stable   Skin: Capillary refill takes less than 2 seconds.   Left lower extremity erythema from mid shin to left foot it is very warm to touch   Psychiatric:   Unable to assess nonverbal       LABS/TESTS:  Results     ** No results found for the last 24 hours. **            Ct Head Wo-headache (rad Read)    Result Date: 09/05/2017  There is evolution of change of the left frontal lobe infarct. There is no acute intracranial abnormality or other significant change. There is chronic sinus disease. ReadingStation:[id number]    Xr Chest Ap Portable    Result Date: 09/05/2017  1. Worsening pleural effusion on the right. Right Pleurx catheter is unchanged. 2. Air bronchogram was seen in the left lower lobe. Infiltrate or atelectasis? 3. Cardiomegaly. ReadingStation:[id number]            EKG:   I have interpreted the EKG at the time it was performed and my official reading is as follows:  Last EKG Result     None            ED COURSE:  BP (!) 88/70   Pulse (!) 110   Temp 99.9 F (37.7 C) (Rectal)   Resp 18   Ht 1.6 m   Wt 52.5 kg   SpO2 98%   BMI 20.50 kg/m   ED Course as of Sep 05 2149   Fri Sep 05, 2017   1528 She is in hospice.  At this time the daughter declined at work.  [SB]   1554 I had a long conversation with the family and case management in the room.  She is on hospice at this time.  I explained that my concern is that she  may be septic due to a cellulitis of her left lower extremity.  The daughter stated that the patient had said in the past that she would no longer take antibiotics because they cause recurrent severe diarrhea.  She states that gets so bad that she will hide antibiotics and not take them.  Explained that if my suspicion is correct and that she is septic that she would most likely not survive the next 48 to 72 hours.  Letter understood.  We will attempt to arrange a  bedside commode and hospital bed at home through hospice tonight.  Daughter states that that would be excellent for her.  She thanked [place] for our help and stated that she just wanted her mother to be comfortable.  They do have pain medications at home including morphine.  [SB]      ED Course User Index  [SB] Kristina Bouillon, MD         PROCEDURES:        IMPRESSION:     84 y.o. female with  1. Fall at home, subsequent encounter    2. Cellulitis and abscess of left lower extremity        DIFFERENTIAL DIAGNOSIS:  HEAD INJURY, CERVICAL SPINE  INJURY, UTI, ELECTROLYTE ABNORMALITY, AMI, HYPOGLYCEMIA, CVA, TIA, SEPSIS, ANEMIA, GI BLEED.       MEDICAL DECISION MAKING:  I have evaluated all labs and imaging studies in the scope of a single Emergency Department visit and have determined that at this time an acute life threatening illness  does not exist. I have explained all results to the family and all questions have been answered.  The family have been given strict return instructions to return to the ER for any worsening or changing symptoms and that they should follow up with a primary care provider. The patient was discharge home in stable condition.      PLAN:   ED Disposition     ED Disposition Condition Date/Time Comment    Discharge  Fri Sep 05, 2017  4:02 PM Dimple Nanas Veillon discharge to home/self care.    Condition at disposition: Stable        Discharge Medication List as of 09/05/2017  4:04 PM             Kristina Bouillon, MD  09/05/17 2151

## 2017-09-05 NOTE — ED Notes (Signed)
Pts daughter refused EKG ,

## 2017-09-05 NOTE — ED Notes (Signed)
Upon beginning orders for sepsis alert pt daughter Kristina [name] had questions as to what the plan of care was, when discussing sepsis alert she wanted to further consult with her brother Kristina [name] .  Kristina [name] is Kristina [name]'s medical POA.  [name]  case manager has contacted [employer] and they are on their way to discuss plan of care with family and patient.  At this time per request of family no procedures have began.  Dr. Nechama Guard aware of family request and respectfully acknowledges their wishes.

## 2017-09-05 NOTE — Discharge Instructions (Signed)
Discharge Instructions for Cellulitis  You have been diagnosed with cellulitis. This is an infection in the deepest layer of the skin. In some cases, the infection also affects the muscle. Cellulitis is caused by bacteria. The bacteria canenter the body through broken skin. This can happen with a cut, scratch, animal bite, or an insect bite that has been scratched. You may have been treated in the hospital with antibiotics and fluids. You will likely be given a prescription for antibiotics to take at home. This sheet will help you take care of yourself at home.  Home care  When you are home:   Take the prescribed antibiotic medicine you are given as directed until it is gone. Take it even if you feel better. It treats the infection and stops it from returning. Not taking all the medicine can make future infections hard to treat.   Keep the infected area clean.   When possible, raise the infected area above the level of your heart. This helps keep swelling down.   Talk with your healthcare provider if you are in pain. Ask what kind of over-the-counter medicine you can take for pain.   Apply clean bandages as advised.   Take your temperature once a day for a week.   Wash your hands often to prevent spreading the infection.  In the future, wash your hands before and after you touch cuts, scratches, or bandages. This will help prevent infection.  When to call your healthcare provider  Call your healthcare provider immediately if you have any of the following:   Difficulty or pain when moving the joints above or below the infected area   Discharge or pus draining from the area   Feverof100.31F (38C) or higher, or as directed by your healthcare provider   Pain that gets worse in or around the infected   Redness that gets worse in or around the infected area,particularly if the area of redness expands to a wider area   Shaking chills   Swelling of the infected area   Vomiting   Date Last Reviewed:  10/17/2014   2000-2018 [employer], [place]. [address] [place], [place] [address]. All rights reserved. This information is not intended as a substitute for professional medical care. Always follow your healthcare professional's instructions.    We have discussed that this is most likely sepsis.  Per your wishes and hospice treatment has been declined.  This is most likely a fatal infection.  The death process can occasionally be very difficult for family members.  It is not unusual for patients to become agitated as the time comes closer to death.  You may use morphine if the patient appears to be in pain or is having significant distress.  You may contact hospice for further guidance.

## 2017-09-05 NOTE — ED Notes (Signed)
As per the son's request it was discussed with patient about possible sepsis and treatment.  Pt told this RN and her [name] that she did not want to stay here nor did she want anything else to be done right now.  MD made aware, awaiting [facility]

## 2017-09-05 NOTE — ED Notes (Signed)
Bed: RAU-A  Expected date:   Expected time:   Means of arrival:   Comments:  EMS

## 2017-09-05 NOTE — Progress Notes (Addendum)
ED Note:     CM contacted [facility], they will come see pnt in the ED.     Addendum: [facility] was unable to come to ED to see pnt. MD and family decided on discharge home. CM spoke with [facility], they will deliver hospital bed and [facility] to home tonight and have their on call nurse visit as well. CM spoke with [facility] [phone] and bed will be delivered between 4 and 8 this evening. Spoke with nurse, she will set up transport for 7:30 pm. Family agreeable.     [name], RN, BSN, ACM  Case Management/Utilization Review Emergency Department  [phone]  Office Hours: Monday - Friday 10 a.m. to 6 p.m.

## 2017-09-05 NOTE — ED Triage Notes (Signed)
Pt fell x2 since  Last pm.  Pt reports left sided pain to left shoulder and left hip.  Pt is a hospice patient living at home with family.  Pt normally wears home o2 at 4l n/c.  Pt is alert and oriented to PPT

## 2017-09-10 DIAGNOSIS — C541 Malignant neoplasm of endometrium: Secondary | ICD-10-CM | POA: Diagnosis not present

## 2017-09-10 DIAGNOSIS — J918 Pleural effusion in other conditions classified elsewhere: Secondary | ICD-10-CM | POA: Diagnosis not present

## 2017-09-12 ENCOUNTER — Other Ambulatory Visit: Payer: Self-pay

## 2017-09-15 DEATH — deceased

## 2018-04-01 IMAGING — CR DG CHEST 2V
2 series · 2 of 2 positions shown · non-contrast
Comparison: 02/10/2013

CLINICAL DATA: JUMPER. SOB x 1 month with worsening symptoms over the
last week. NKI. No hx of surg. Hx of skin cancer.

EXAM:
CHEST  2 VIEW

[chest pa]
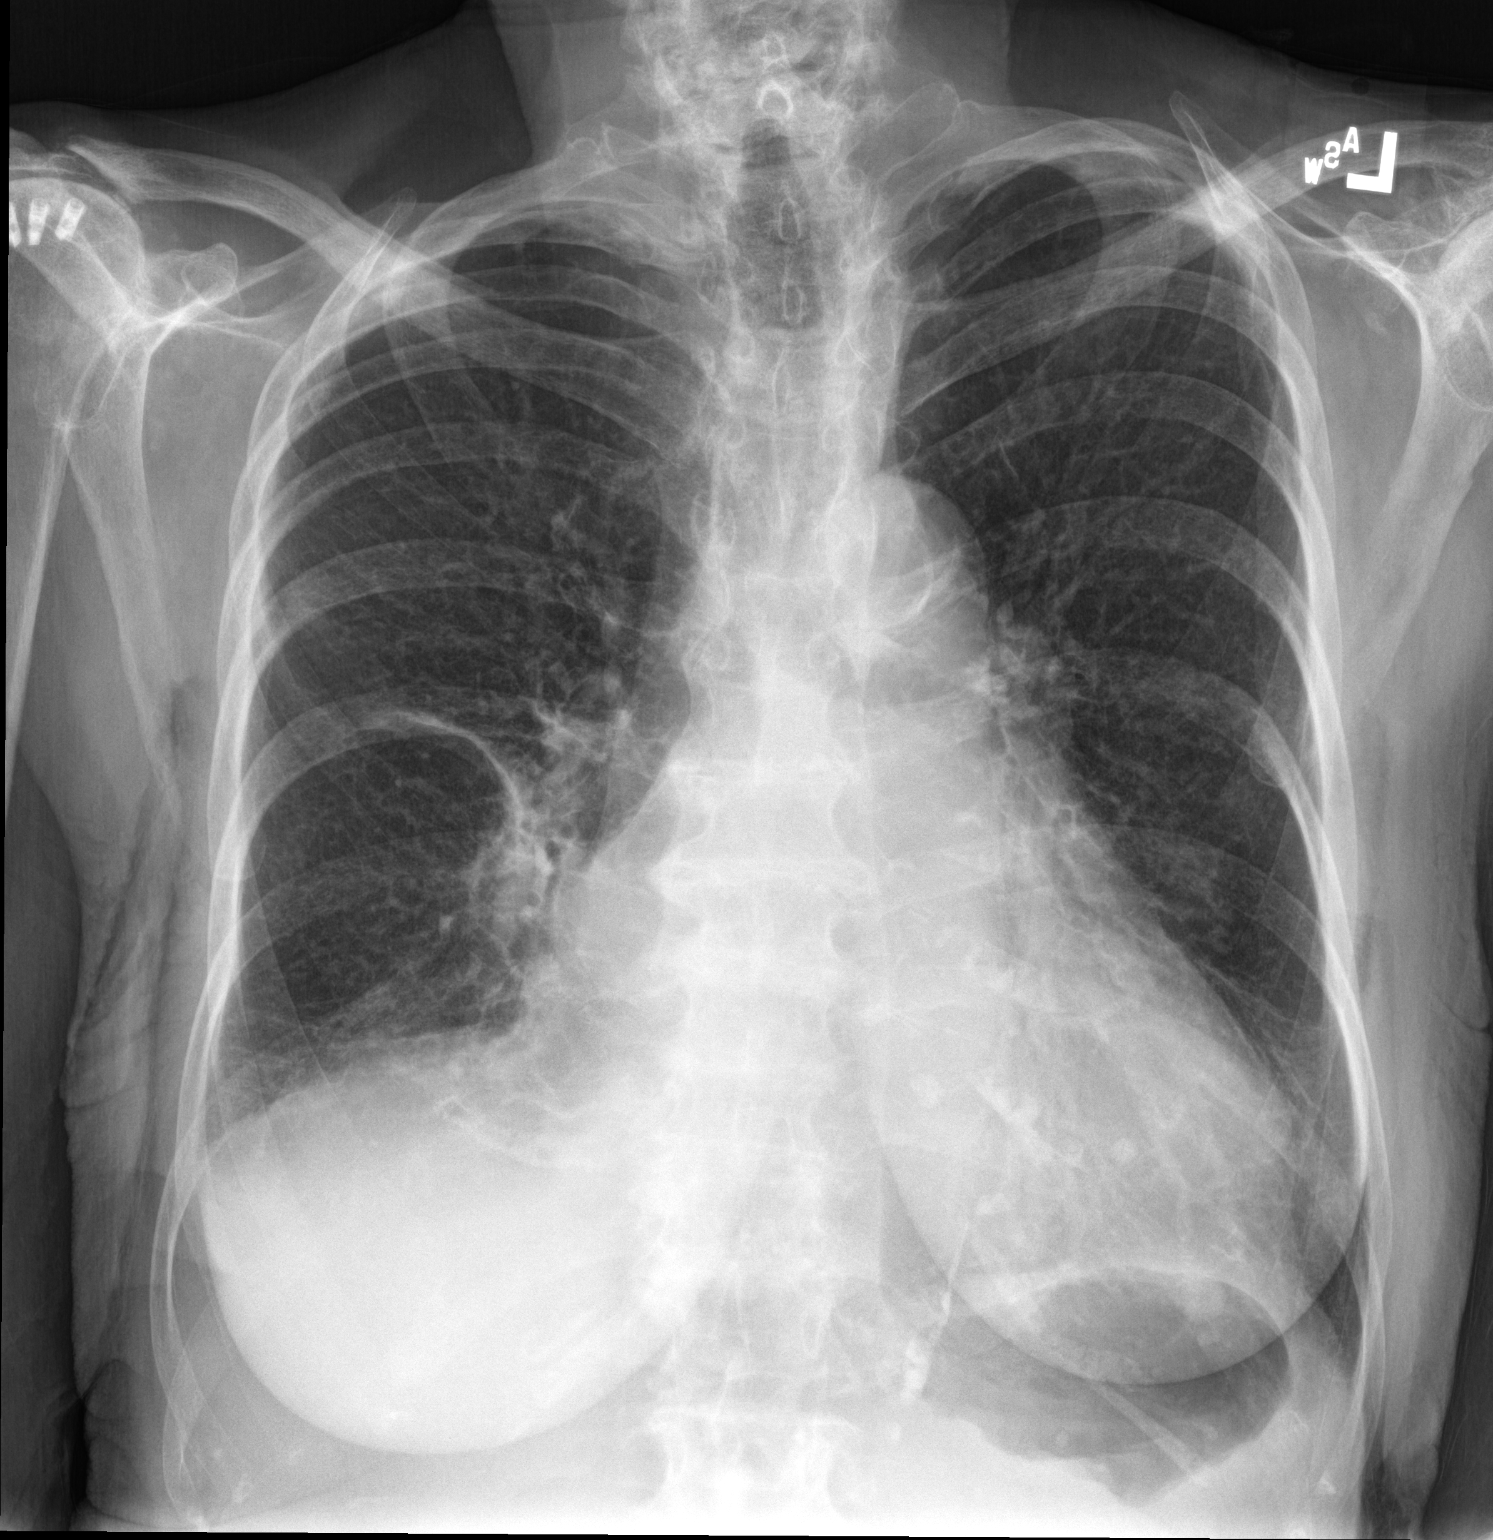

[chest lat]
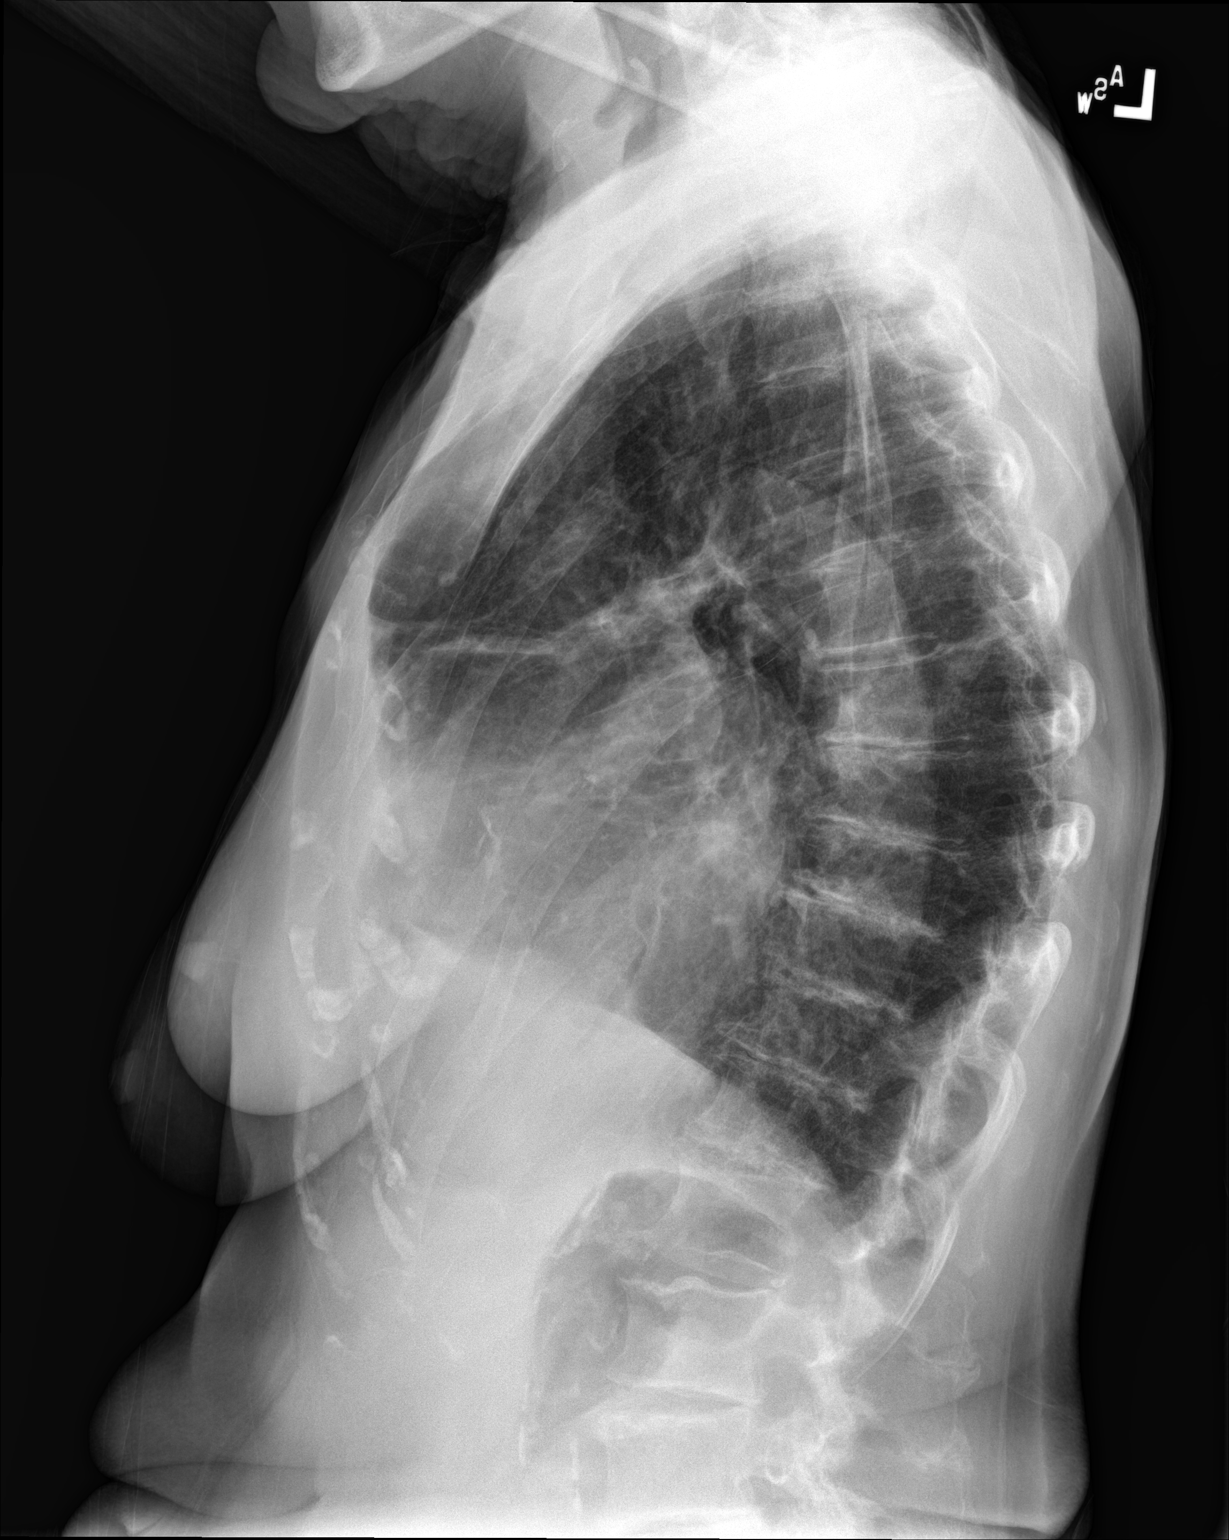

[2 of 2 positions shown; findings below may reference images not displayed]

FINDINGS: Mild enlargement of the cardiopericardial silhouette. No mediastinal
or hilar masses. No evidence of adenopathy.

Mild linear opacity extends laterally from the right hilum and is
noted at both lung bases, most likely atelectasis. No convincing
pneumonia. Lungs are mildly hyperexpanded but otherwise clear.

No pleural effusion.  No pneumothorax.

Skeletal structures are demineralized. There arthropathic changes of
both shoulders. Postsurgical changes are noted of the right
shoulder.
IMPRESSION: 1. No convincing pneumonia or acute cardiopulmonary disease.
2. Mild right perihilar and bilateral lung base atelectasis.

## 2018-12-31 IMAGING — CR DG CHEST 2V
2 series · 2 of 2 positions shown · non-contrast
Comparison: Chest x-ray dated 05/02/2016.

CLINICAL DATA: Pt states she has been having dyspnea upon exertion
x 1.5 years but worse since weather turned cold. Decreased breath
sounds right lower lung. afib x 2 years, hx right lung collapse from
raking leaves

EXAM:
CHEST  2 VIEW

[chest pa]
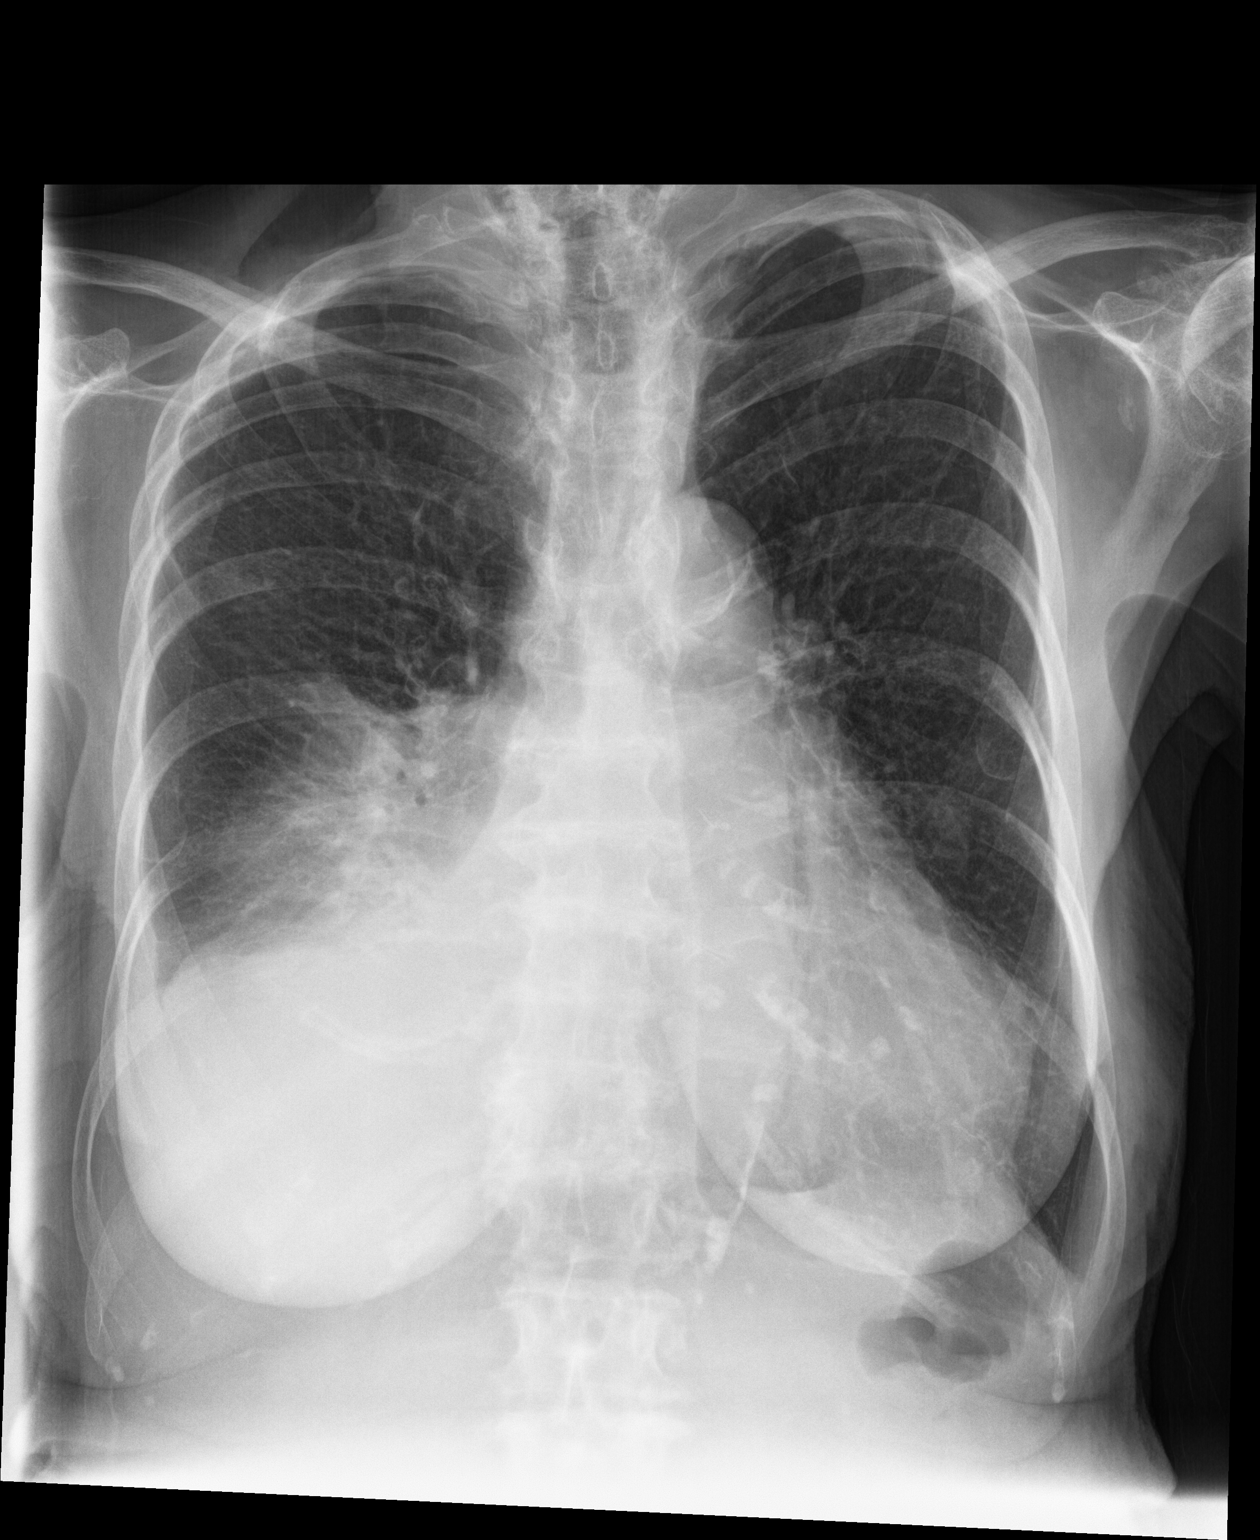

[chest lat]
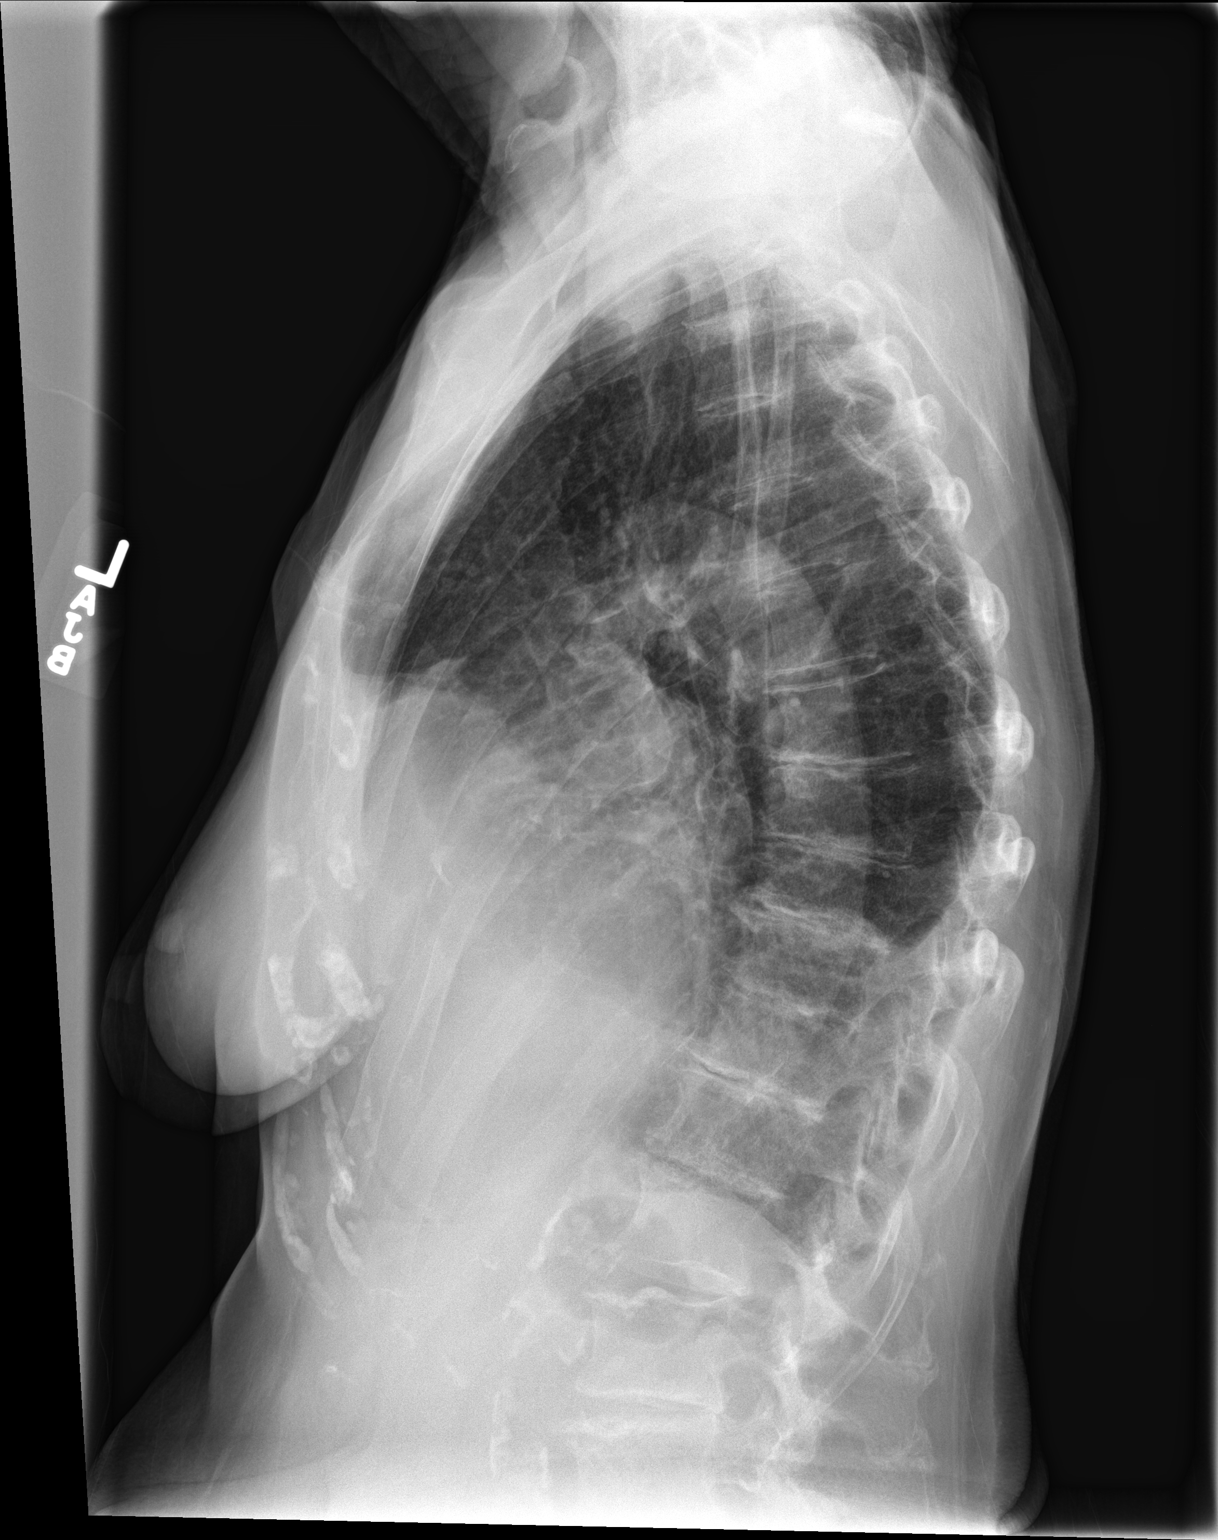

[2 of 2 positions shown; findings below may reference images not displayed]

FINDINGS: New dense opacity at the right lung base. Probable small right
pleural effusion. Left lung is clear. Heart size and mediastinal
contours appear stable. Atherosclerotic changes noted at the aortic
arch.

Degenerative changes throughout the thoracolumbar spine, mild to
moderate in degree. No acute or suspicious osseous finding.
IMPRESSION: 1. New dense opacity at the medial aspects of the right lung base,
most likely pneumonia with small parapneumonic pleural effusion.
Recommend follow-up chest x-rays to ensure resolution.
2. Left lung is hyperexpanded suggesting COPD.
3. Stable cardiomegaly.
4. Aortic atherosclerosis.
